# Patient Record
Sex: Female | Born: 1935
Health system: Southern US, Community
[De-identification: ages and names within clinical notes are randomized; demographics above are authoritative.]

## PROBLEM LIST (undated history)

## (undated) DIAGNOSIS — C50919 Malignant neoplasm of unspecified site of unspecified female breast: Secondary | ICD-10-CM

## (undated) DIAGNOSIS — H409 Unspecified glaucoma: Secondary | ICD-10-CM

## (undated) DIAGNOSIS — T4145XA Adverse effect of unspecified anesthetic, initial encounter: Secondary | ICD-10-CM

## (undated) DIAGNOSIS — M1990 Unspecified osteoarthritis, unspecified site: Secondary | ICD-10-CM

## (undated) DIAGNOSIS — I10 Essential (primary) hypertension: Secondary | ICD-10-CM

## (undated) DIAGNOSIS — T8859XA Other complications of anesthesia, initial encounter: Secondary | ICD-10-CM

## (undated) DIAGNOSIS — L719 Rosacea, unspecified: Secondary | ICD-10-CM

## (undated) DIAGNOSIS — K219 Gastro-esophageal reflux disease without esophagitis: Secondary | ICD-10-CM

## (undated) DIAGNOSIS — M810 Age-related osteoporosis without current pathological fracture: Secondary | ICD-10-CM

## (undated) DIAGNOSIS — C801 Malignant (primary) neoplasm, unspecified: Secondary | ICD-10-CM

## (undated) HISTORY — PX: TONSILLECTOMY: SUR1361

## (undated) HISTORY — DX: Unspecified glaucoma: H40.9

## (undated) HISTORY — PX: APPENDECTOMY: SHX54

## (undated) HISTORY — DX: Age-related osteoporosis without current pathological fracture: M81.0

## (undated) HISTORY — DX: Essential (primary) hypertension: I10

## (undated) HISTORY — PX: BREAST BIOPSY: SHX20

## (undated) HISTORY — DX: Unspecified osteoarthritis, unspecified site: M19.90

## (undated) HISTORY — PX: EYE SURGERY: SHX253

## (undated) HISTORY — PX: BACK SURGERY: SHX140

## (undated) HISTORY — PX: SPINE SURGERY: SHX786

---

## 1998-07-20 ENCOUNTER — Other Ambulatory Visit: Admission: RE | Admit: 1998-07-20 | Discharge: 1998-07-20 | Payer: Self-pay | Admitting: *Deleted

## 1999-08-19 ENCOUNTER — Encounter: Payer: Self-pay | Admitting: *Deleted

## 1999-08-19 ENCOUNTER — Encounter: Admission: RE | Admit: 1999-08-19 | Discharge: 1999-08-19 | Payer: Self-pay | Admitting: *Deleted

## 1999-08-20 ENCOUNTER — Other Ambulatory Visit: Admission: RE | Admit: 1999-08-20 | Discharge: 1999-08-20 | Payer: Self-pay | Admitting: *Deleted

## 2000-08-21 ENCOUNTER — Other Ambulatory Visit: Admission: RE | Admit: 2000-08-21 | Discharge: 2000-08-21 | Payer: Self-pay | Admitting: *Deleted

## 2000-08-22 ENCOUNTER — Encounter (INDEPENDENT_AMBULATORY_CARE_PROVIDER_SITE_OTHER): Payer: Self-pay | Admitting: Specialist

## 2000-08-22 ENCOUNTER — Other Ambulatory Visit: Admission: RE | Admit: 2000-08-22 | Discharge: 2000-08-22 | Payer: Self-pay | Admitting: *Deleted

## 2001-04-26 ENCOUNTER — Other Ambulatory Visit: Admission: RE | Admit: 2001-04-26 | Discharge: 2001-04-26 | Payer: Self-pay | Admitting: *Deleted

## 2001-08-22 ENCOUNTER — Other Ambulatory Visit: Admission: RE | Admit: 2001-08-22 | Discharge: 2001-08-22 | Payer: Self-pay | Admitting: *Deleted

## 2002-10-30 ENCOUNTER — Other Ambulatory Visit: Admission: RE | Admit: 2002-10-30 | Discharge: 2002-10-30 | Payer: Self-pay | Admitting: *Deleted

## 2002-12-18 ENCOUNTER — Encounter: Payer: Self-pay | Admitting: Family Medicine

## 2002-12-18 ENCOUNTER — Encounter: Admission: RE | Admit: 2002-12-18 | Discharge: 2002-12-18 | Payer: Self-pay | Admitting: Family Medicine

## 2004-01-08 ENCOUNTER — Other Ambulatory Visit: Admission: RE | Admit: 2004-01-08 | Discharge: 2004-01-08 | Payer: Self-pay | Admitting: *Deleted

## 2004-01-28 ENCOUNTER — Encounter: Admission: RE | Admit: 2004-01-28 | Discharge: 2004-01-28 | Payer: Self-pay | Admitting: *Deleted

## 2008-07-02 ENCOUNTER — Encounter: Admission: RE | Admit: 2008-07-02 | Discharge: 2008-07-02 | Payer: Self-pay | Admitting: Family Medicine

## 2009-05-14 ENCOUNTER — Encounter: Payer: Self-pay | Admitting: Cardiology

## 2010-03-02 ENCOUNTER — Encounter: Admission: RE | Admit: 2010-03-02 | Discharge: 2010-03-02 | Payer: Self-pay | Admitting: Family Medicine

## 2010-08-01 ENCOUNTER — Encounter: Payer: Self-pay | Admitting: Family Medicine

## 2011-08-23 DIAGNOSIS — H43819 Vitreous degeneration, unspecified eye: Secondary | ICD-10-CM | POA: Diagnosis not present

## 2011-08-23 DIAGNOSIS — H4010X0 Unspecified open-angle glaucoma, stage unspecified: Secondary | ICD-10-CM | POA: Diagnosis not present

## 2011-08-23 DIAGNOSIS — H2510 Age-related nuclear cataract, unspecified eye: Secondary | ICD-10-CM | POA: Diagnosis not present

## 2011-09-19 DIAGNOSIS — H2510 Age-related nuclear cataract, unspecified eye: Secondary | ICD-10-CM | POA: Diagnosis not present

## 2011-09-19 DIAGNOSIS — H18419 Arcus senilis, unspecified eye: Secondary | ICD-10-CM | POA: Diagnosis not present

## 2011-09-28 DIAGNOSIS — IMO0002 Reserved for concepts with insufficient information to code with codable children: Secondary | ICD-10-CM | POA: Diagnosis not present

## 2011-09-28 DIAGNOSIS — H2510 Age-related nuclear cataract, unspecified eye: Secondary | ICD-10-CM | POA: Diagnosis not present

## 2011-10-05 DIAGNOSIS — IMO0002 Reserved for concepts with insufficient information to code with codable children: Secondary | ICD-10-CM | POA: Diagnosis not present

## 2011-10-05 DIAGNOSIS — H2510 Age-related nuclear cataract, unspecified eye: Secondary | ICD-10-CM | POA: Diagnosis not present

## 2011-11-14 DIAGNOSIS — E7800 Pure hypercholesterolemia, unspecified: Secondary | ICD-10-CM | POA: Diagnosis not present

## 2011-11-14 DIAGNOSIS — M899 Disorder of bone, unspecified: Secondary | ICD-10-CM | POA: Diagnosis not present

## 2011-11-14 DIAGNOSIS — M949 Disorder of cartilage, unspecified: Secondary | ICD-10-CM | POA: Diagnosis not present

## 2011-11-14 DIAGNOSIS — I10 Essential (primary) hypertension: Secondary | ICD-10-CM | POA: Diagnosis not present

## 2011-11-14 DIAGNOSIS — D126 Benign neoplasm of colon, unspecified: Secondary | ICD-10-CM | POA: Diagnosis not present

## 2011-11-17 DIAGNOSIS — Z1211 Encounter for screening for malignant neoplasm of colon: Secondary | ICD-10-CM | POA: Diagnosis not present

## 2011-11-21 DIAGNOSIS — E7800 Pure hypercholesterolemia, unspecified: Secondary | ICD-10-CM | POA: Diagnosis not present

## 2011-12-26 DIAGNOSIS — I10 Essential (primary) hypertension: Secondary | ICD-10-CM | POA: Diagnosis not present

## 2011-12-26 DIAGNOSIS — R609 Edema, unspecified: Secondary | ICD-10-CM | POA: Diagnosis not present

## 2011-12-26 DIAGNOSIS — E7800 Pure hypercholesterolemia, unspecified: Secondary | ICD-10-CM | POA: Diagnosis not present

## 2012-01-09 DIAGNOSIS — R609 Edema, unspecified: Secondary | ICD-10-CM | POA: Diagnosis not present

## 2012-01-09 DIAGNOSIS — E7800 Pure hypercholesterolemia, unspecified: Secondary | ICD-10-CM | POA: Diagnosis not present

## 2012-01-09 DIAGNOSIS — I10 Essential (primary) hypertension: Secondary | ICD-10-CM | POA: Diagnosis not present

## 2012-03-05 DIAGNOSIS — H4011X0 Primary open-angle glaucoma, stage unspecified: Secondary | ICD-10-CM | POA: Diagnosis not present

## 2012-03-14 DIAGNOSIS — Z961 Presence of intraocular lens: Secondary | ICD-10-CM | POA: Diagnosis not present

## 2012-03-14 DIAGNOSIS — H26499 Other secondary cataract, unspecified eye: Secondary | ICD-10-CM | POA: Diagnosis not present

## 2012-03-28 DIAGNOSIS — H26499 Other secondary cataract, unspecified eye: Secondary | ICD-10-CM | POA: Diagnosis not present

## 2012-04-09 ENCOUNTER — Other Ambulatory Visit: Payer: Self-pay | Admitting: Neurosurgery

## 2012-04-09 DIAGNOSIS — D320 Benign neoplasm of cerebral meninges: Secondary | ICD-10-CM

## 2012-04-27 DIAGNOSIS — Z23 Encounter for immunization: Secondary | ICD-10-CM | POA: Diagnosis not present

## 2012-05-03 DIAGNOSIS — H4011X0 Primary open-angle glaucoma, stage unspecified: Secondary | ICD-10-CM | POA: Diagnosis not present

## 2012-05-03 DIAGNOSIS — Z961 Presence of intraocular lens: Secondary | ICD-10-CM | POA: Diagnosis not present

## 2012-05-07 DIAGNOSIS — M899 Disorder of bone, unspecified: Secondary | ICD-10-CM | POA: Diagnosis not present

## 2012-05-07 DIAGNOSIS — M949 Disorder of cartilage, unspecified: Secondary | ICD-10-CM | POA: Diagnosis not present

## 2012-05-07 DIAGNOSIS — I10 Essential (primary) hypertension: Secondary | ICD-10-CM | POA: Diagnosis not present

## 2012-05-07 DIAGNOSIS — M79609 Pain in unspecified limb: Secondary | ICD-10-CM | POA: Diagnosis not present

## 2012-05-07 DIAGNOSIS — E7800 Pure hypercholesterolemia, unspecified: Secondary | ICD-10-CM | POA: Diagnosis not present

## 2012-05-08 ENCOUNTER — Ambulatory Visit
Admission: RE | Admit: 2012-05-08 | Discharge: 2012-05-08 | Disposition: A | Discharge status: Home / Self Care | Payer: Medicare Other | Source: Ambulatory Visit | Attending: Neurosurgery | Admitting: Neurosurgery

## 2012-05-08 DIAGNOSIS — D320 Benign neoplasm of cerebral meninges: Secondary | ICD-10-CM

## 2012-05-08 MED ORDER — GADOBENATE DIMEGLUMINE 529 MG/ML IV SOLN
13.0000 mL | Freq: Once | INTRAVENOUS | Status: AC | PRN
  Administered 2012-05-08: 13 mL via INTRAVENOUS

## 2012-05-25 DIAGNOSIS — D320 Benign neoplasm of cerebral meninges: Secondary | ICD-10-CM | POA: Diagnosis not present

## 2012-08-03 DIAGNOSIS — Z961 Presence of intraocular lens: Secondary | ICD-10-CM | POA: Diagnosis not present

## 2012-08-03 DIAGNOSIS — H4011X0 Primary open-angle glaucoma, stage unspecified: Secondary | ICD-10-CM | POA: Diagnosis not present

## 2012-08-06 DIAGNOSIS — E7800 Pure hypercholesterolemia, unspecified: Secondary | ICD-10-CM | POA: Diagnosis not present

## 2012-09-12 DIAGNOSIS — L909 Atrophic disorder of skin, unspecified: Secondary | ICD-10-CM | POA: Diagnosis not present

## 2012-09-12 DIAGNOSIS — L919 Hypertrophic disorder of the skin, unspecified: Secondary | ICD-10-CM | POA: Diagnosis not present

## 2012-09-12 DIAGNOSIS — L821 Other seborrheic keratosis: Secondary | ICD-10-CM | POA: Diagnosis not present

## 2012-11-05 DIAGNOSIS — IMO0002 Reserved for concepts with insufficient information to code with codable children: Secondary | ICD-10-CM | POA: Diagnosis not present

## 2012-11-27 DIAGNOSIS — I10 Essential (primary) hypertension: Secondary | ICD-10-CM | POA: Diagnosis not present

## 2012-11-27 DIAGNOSIS — E7800 Pure hypercholesterolemia, unspecified: Secondary | ICD-10-CM | POA: Diagnosis not present

## 2012-11-27 DIAGNOSIS — T148XXA Other injury of unspecified body region, initial encounter: Secondary | ICD-10-CM | POA: Diagnosis not present

## 2013-01-18 DIAGNOSIS — H4011X0 Primary open-angle glaucoma, stage unspecified: Secondary | ICD-10-CM | POA: Diagnosis not present

## 2013-01-18 DIAGNOSIS — Z961 Presence of intraocular lens: Secondary | ICD-10-CM | POA: Diagnosis not present

## 2013-01-18 DIAGNOSIS — H04129 Dry eye syndrome of unspecified lacrimal gland: Secondary | ICD-10-CM | POA: Diagnosis not present

## 2013-01-29 DIAGNOSIS — E7800 Pure hypercholesterolemia, unspecified: Secondary | ICD-10-CM | POA: Diagnosis not present

## 2013-01-29 DIAGNOSIS — M545 Low back pain, unspecified: Secondary | ICD-10-CM | POA: Diagnosis not present

## 2013-01-29 DIAGNOSIS — I10 Essential (primary) hypertension: Secondary | ICD-10-CM | POA: Diagnosis not present

## 2013-02-27 DIAGNOSIS — M899 Disorder of bone, unspecified: Secondary | ICD-10-CM | POA: Diagnosis not present

## 2013-02-27 DIAGNOSIS — Z23 Encounter for immunization: Secondary | ICD-10-CM | POA: Diagnosis not present

## 2013-02-27 DIAGNOSIS — I10 Essential (primary) hypertension: Secondary | ICD-10-CM | POA: Diagnosis not present

## 2013-02-27 DIAGNOSIS — E7800 Pure hypercholesterolemia, unspecified: Secondary | ICD-10-CM | POA: Diagnosis not present

## 2013-04-24 DIAGNOSIS — H4011X0 Primary open-angle glaucoma, stage unspecified: Secondary | ICD-10-CM | POA: Diagnosis not present

## 2013-05-07 DIAGNOSIS — I10 Essential (primary) hypertension: Secondary | ICD-10-CM | POA: Diagnosis not present

## 2013-05-07 DIAGNOSIS — M899 Disorder of bone, unspecified: Secondary | ICD-10-CM | POA: Diagnosis not present

## 2013-05-07 DIAGNOSIS — Z0000 Encounter for general adult medical examination without abnormal findings: Secondary | ICD-10-CM | POA: Diagnosis not present

## 2013-05-07 DIAGNOSIS — H409 Unspecified glaucoma: Secondary | ICD-10-CM | POA: Diagnosis not present

## 2013-05-07 DIAGNOSIS — D126 Benign neoplasm of colon, unspecified: Secondary | ICD-10-CM | POA: Diagnosis not present

## 2013-05-28 DIAGNOSIS — Z1211 Encounter for screening for malignant neoplasm of colon: Secondary | ICD-10-CM | POA: Diagnosis not present

## 2013-08-12 DIAGNOSIS — I10 Essential (primary) hypertension: Secondary | ICD-10-CM | POA: Diagnosis not present

## 2013-09-09 DIAGNOSIS — H4011X0 Primary open-angle glaucoma, stage unspecified: Secondary | ICD-10-CM | POA: Diagnosis not present

## 2013-09-09 DIAGNOSIS — H47239 Glaucomatous optic atrophy, unspecified eye: Secondary | ICD-10-CM | POA: Diagnosis not present

## 2013-09-11 DIAGNOSIS — Z23 Encounter for immunization: Secondary | ICD-10-CM | POA: Diagnosis not present

## 2013-09-11 DIAGNOSIS — M79609 Pain in unspecified limb: Secondary | ICD-10-CM | POA: Diagnosis not present

## 2013-09-11 DIAGNOSIS — I10 Essential (primary) hypertension: Secondary | ICD-10-CM | POA: Diagnosis not present

## 2014-02-03 DIAGNOSIS — I959 Hypotension, unspecified: Secondary | ICD-10-CM | POA: Diagnosis not present

## 2014-02-21 DIAGNOSIS — M545 Low back pain, unspecified: Secondary | ICD-10-CM | POA: Diagnosis not present

## 2014-02-21 DIAGNOSIS — R55 Syncope and collapse: Secondary | ICD-10-CM | POA: Diagnosis not present

## 2014-02-24 DIAGNOSIS — H47239 Glaucomatous optic atrophy, unspecified eye: Secondary | ICD-10-CM | POA: Diagnosis not present

## 2014-03-14 DIAGNOSIS — E7800 Pure hypercholesterolemia, unspecified: Secondary | ICD-10-CM | POA: Diagnosis not present

## 2014-03-14 DIAGNOSIS — D126 Benign neoplasm of colon, unspecified: Secondary | ICD-10-CM | POA: Diagnosis not present

## 2014-03-14 DIAGNOSIS — Z23 Encounter for immunization: Secondary | ICD-10-CM | POA: Diagnosis not present

## 2014-03-14 DIAGNOSIS — M899 Disorder of bone, unspecified: Secondary | ICD-10-CM | POA: Diagnosis not present

## 2014-03-14 DIAGNOSIS — I10 Essential (primary) hypertension: Secondary | ICD-10-CM | POA: Diagnosis not present

## 2014-03-18 DIAGNOSIS — D126 Benign neoplasm of colon, unspecified: Secondary | ICD-10-CM | POA: Diagnosis not present

## 2014-03-18 DIAGNOSIS — I10 Essential (primary) hypertension: Secondary | ICD-10-CM | POA: Diagnosis not present

## 2014-03-18 DIAGNOSIS — M899 Disorder of bone, unspecified: Secondary | ICD-10-CM | POA: Diagnosis not present

## 2014-03-18 DIAGNOSIS — M949 Disorder of cartilage, unspecified: Secondary | ICD-10-CM | POA: Diagnosis not present

## 2014-03-18 DIAGNOSIS — E7800 Pure hypercholesterolemia, unspecified: Secondary | ICD-10-CM | POA: Diagnosis not present

## 2014-03-21 DIAGNOSIS — H1030 Unspecified acute conjunctivitis, unspecified eye: Secondary | ICD-10-CM | POA: Diagnosis not present

## 2014-03-21 DIAGNOSIS — H4011X0 Primary open-angle glaucoma, stage unspecified: Secondary | ICD-10-CM | POA: Diagnosis not present

## 2014-05-01 DIAGNOSIS — H02836 Dermatochalasis of left eye, unspecified eyelid: Secondary | ICD-10-CM | POA: Diagnosis not present

## 2014-05-01 DIAGNOSIS — H4011X2 Primary open-angle glaucoma, moderate stage: Secondary | ICD-10-CM | POA: Diagnosis not present

## 2014-05-01 DIAGNOSIS — H04123 Dry eye syndrome of bilateral lacrimal glands: Secondary | ICD-10-CM | POA: Diagnosis not present

## 2014-05-14 DIAGNOSIS — M8580 Other specified disorders of bone density and structure, unspecified site: Secondary | ICD-10-CM | POA: Diagnosis not present

## 2014-06-26 DIAGNOSIS — R55 Syncope and collapse: Secondary | ICD-10-CM | POA: Diagnosis not present

## 2014-06-26 DIAGNOSIS — G44209 Tension-type headache, unspecified, not intractable: Secondary | ICD-10-CM | POA: Diagnosis not present

## 2014-06-26 DIAGNOSIS — I10 Essential (primary) hypertension: Secondary | ICD-10-CM | POA: Diagnosis not present

## 2014-07-15 DIAGNOSIS — Z86010 Personal history of colonic polyps: Secondary | ICD-10-CM | POA: Diagnosis not present

## 2014-07-15 DIAGNOSIS — K5900 Constipation, unspecified: Secondary | ICD-10-CM | POA: Diagnosis not present

## 2014-07-18 ENCOUNTER — Encounter: Payer: Self-pay | Admitting: Cardiology

## 2014-07-18 DIAGNOSIS — R55 Syncope and collapse: Secondary | ICD-10-CM | POA: Diagnosis not present

## 2014-07-18 DIAGNOSIS — M2550 Pain in unspecified joint: Secondary | ICD-10-CM | POA: Diagnosis not present

## 2014-08-22 DIAGNOSIS — Z09 Encounter for follow-up examination after completed treatment for conditions other than malignant neoplasm: Secondary | ICD-10-CM | POA: Diagnosis not present

## 2014-08-22 DIAGNOSIS — Z86010 Personal history of colonic polyps: Secondary | ICD-10-CM | POA: Diagnosis not present

## 2014-08-22 DIAGNOSIS — D128 Benign neoplasm of rectum: Secondary | ICD-10-CM | POA: Diagnosis not present

## 2014-08-22 DIAGNOSIS — D126 Benign neoplasm of colon, unspecified: Secondary | ICD-10-CM | POA: Diagnosis not present

## 2014-09-01 DIAGNOSIS — L820 Inflamed seborrheic keratosis: Secondary | ICD-10-CM | POA: Diagnosis not present

## 2014-09-01 DIAGNOSIS — L821 Other seborrheic keratosis: Secondary | ICD-10-CM | POA: Diagnosis not present

## 2014-09-02 DIAGNOSIS — H4011X2 Primary open-angle glaucoma, moderate stage: Secondary | ICD-10-CM | POA: Diagnosis not present

## 2014-09-22 DIAGNOSIS — M859 Disorder of bone density and structure, unspecified: Secondary | ICD-10-CM | POA: Diagnosis not present

## 2014-09-22 DIAGNOSIS — H409 Unspecified glaucoma: Secondary | ICD-10-CM | POA: Diagnosis not present

## 2014-09-22 DIAGNOSIS — Z86010 Personal history of colonic polyps: Secondary | ICD-10-CM | POA: Diagnosis not present

## 2014-09-22 DIAGNOSIS — I10 Essential (primary) hypertension: Secondary | ICD-10-CM | POA: Diagnosis not present

## 2014-09-26 DIAGNOSIS — H4011X2 Primary open-angle glaucoma, moderate stage: Secondary | ICD-10-CM | POA: Diagnosis not present

## 2014-10-17 DIAGNOSIS — H4011X2 Primary open-angle glaucoma, moderate stage: Secondary | ICD-10-CM | POA: Diagnosis not present

## 2014-10-17 DIAGNOSIS — H16223 Keratoconjunctivitis sicca, not specified as Sjogren's, bilateral: Secondary | ICD-10-CM | POA: Diagnosis not present

## 2014-11-07 DIAGNOSIS — H4011X2 Primary open-angle glaucoma, moderate stage: Secondary | ICD-10-CM | POA: Diagnosis not present

## 2014-11-25 DIAGNOSIS — H4011X2 Primary open-angle glaucoma, moderate stage: Secondary | ICD-10-CM | POA: Diagnosis not present

## 2015-03-25 DIAGNOSIS — M859 Disorder of bone density and structure, unspecified: Secondary | ICD-10-CM | POA: Diagnosis not present

## 2015-03-25 DIAGNOSIS — M2550 Pain in unspecified joint: Secondary | ICD-10-CM | POA: Diagnosis not present

## 2015-03-25 DIAGNOSIS — Z23 Encounter for immunization: Secondary | ICD-10-CM | POA: Diagnosis not present

## 2015-03-25 DIAGNOSIS — I10 Essential (primary) hypertension: Secondary | ICD-10-CM | POA: Diagnosis not present

## 2015-03-31 DIAGNOSIS — H4011X2 Primary open-angle glaucoma, moderate stage: Secondary | ICD-10-CM | POA: Diagnosis not present

## 2015-03-31 DIAGNOSIS — H0289 Other specified disorders of eyelid: Secondary | ICD-10-CM | POA: Diagnosis not present

## 2015-04-29 DIAGNOSIS — M79642 Pain in left hand: Secondary | ICD-10-CM | POA: Diagnosis not present

## 2015-04-29 DIAGNOSIS — M150 Primary generalized (osteo)arthritis: Secondary | ICD-10-CM | POA: Diagnosis not present

## 2015-04-29 DIAGNOSIS — R768 Other specified abnormal immunological findings in serum: Secondary | ICD-10-CM | POA: Diagnosis not present

## 2015-04-29 DIAGNOSIS — M79641 Pain in right hand: Secondary | ICD-10-CM | POA: Diagnosis not present

## 2015-04-29 DIAGNOSIS — R5383 Other fatigue: Secondary | ICD-10-CM | POA: Diagnosis not present

## 2015-04-29 DIAGNOSIS — M7989 Other specified soft tissue disorders: Secondary | ICD-10-CM | POA: Diagnosis not present

## 2015-04-29 DIAGNOSIS — M79671 Pain in right foot: Secondary | ICD-10-CM | POA: Diagnosis not present

## 2015-05-13 DIAGNOSIS — M79642 Pain in left hand: Secondary | ICD-10-CM | POA: Diagnosis not present

## 2015-05-13 DIAGNOSIS — M79671 Pain in right foot: Secondary | ICD-10-CM | POA: Diagnosis not present

## 2015-05-13 DIAGNOSIS — M150 Primary generalized (osteo)arthritis: Secondary | ICD-10-CM | POA: Diagnosis not present

## 2015-05-13 DIAGNOSIS — E7800 Pure hypercholesterolemia, unspecified: Secondary | ICD-10-CM | POA: Diagnosis not present

## 2015-05-13 DIAGNOSIS — M0579 Rheumatoid arthritis with rheumatoid factor of multiple sites without organ or systems involvement: Secondary | ICD-10-CM | POA: Diagnosis not present

## 2015-05-13 DIAGNOSIS — R5383 Other fatigue: Secondary | ICD-10-CM | POA: Diagnosis not present

## 2015-05-13 DIAGNOSIS — M7989 Other specified soft tissue disorders: Secondary | ICD-10-CM | POA: Diagnosis not present

## 2015-07-14 DIAGNOSIS — M150 Primary generalized (osteo)arthritis: Secondary | ICD-10-CM | POA: Diagnosis not present

## 2015-07-14 DIAGNOSIS — R768 Other specified abnormal immunological findings in serum: Secondary | ICD-10-CM | POA: Diagnosis not present

## 2015-07-14 DIAGNOSIS — M0579 Rheumatoid arthritis with rheumatoid factor of multiple sites without organ or systems involvement: Secondary | ICD-10-CM | POA: Diagnosis not present

## 2015-07-22 DIAGNOSIS — M1990 Unspecified osteoarthritis, unspecified site: Secondary | ICD-10-CM | POA: Diagnosis not present

## 2015-07-22 DIAGNOSIS — I10 Essential (primary) hypertension: Secondary | ICD-10-CM | POA: Diagnosis not present

## 2015-07-22 DIAGNOSIS — Z86010 Personal history of colonic polyps: Secondary | ICD-10-CM | POA: Diagnosis not present

## 2015-07-22 DIAGNOSIS — M859 Disorder of bone density and structure, unspecified: Secondary | ICD-10-CM | POA: Diagnosis not present

## 2015-07-22 DIAGNOSIS — M069 Rheumatoid arthritis, unspecified: Secondary | ICD-10-CM | POA: Diagnosis not present

## 2015-07-30 DIAGNOSIS — S60422D Blister (nonthermal) of right middle finger, subsequent encounter: Secondary | ICD-10-CM | POA: Diagnosis not present

## 2015-07-30 DIAGNOSIS — T300 Burn of unspecified body region, unspecified degree: Secondary | ICD-10-CM | POA: Diagnosis not present

## 2015-07-31 DIAGNOSIS — Z79899 Other long term (current) drug therapy: Secondary | ICD-10-CM | POA: Diagnosis not present

## 2015-07-31 DIAGNOSIS — H401132 Primary open-angle glaucoma, bilateral, moderate stage: Secondary | ICD-10-CM | POA: Diagnosis not present

## 2015-07-31 DIAGNOSIS — H16223 Keratoconjunctivitis sicca, not specified as Sjogren's, bilateral: Secondary | ICD-10-CM | POA: Diagnosis not present

## 2015-10-12 DIAGNOSIS — M150 Primary generalized (osteo)arthritis: Secondary | ICD-10-CM | POA: Diagnosis not present

## 2015-10-12 DIAGNOSIS — M0579 Rheumatoid arthritis with rheumatoid factor of multiple sites without organ or systems involvement: Secondary | ICD-10-CM | POA: Diagnosis not present

## 2015-10-12 DIAGNOSIS — M2550 Pain in unspecified joint: Secondary | ICD-10-CM | POA: Diagnosis not present

## 2015-10-12 DIAGNOSIS — R768 Other specified abnormal immunological findings in serum: Secondary | ICD-10-CM | POA: Diagnosis not present

## 2015-10-12 DIAGNOSIS — Z79899 Other long term (current) drug therapy: Secondary | ICD-10-CM | POA: Diagnosis not present

## 2015-10-12 DIAGNOSIS — M720 Palmar fascial fibromatosis [Dupuytren]: Secondary | ICD-10-CM | POA: Diagnosis not present

## 2016-01-13 DIAGNOSIS — M545 Low back pain: Secondary | ICD-10-CM | POA: Diagnosis not present

## 2016-01-13 DIAGNOSIS — M150 Primary generalized (osteo)arthritis: Secondary | ICD-10-CM | POA: Diagnosis not present

## 2016-01-13 DIAGNOSIS — M79641 Pain in right hand: Secondary | ICD-10-CM | POA: Diagnosis not present

## 2016-01-13 DIAGNOSIS — Z79899 Other long term (current) drug therapy: Secondary | ICD-10-CM | POA: Diagnosis not present

## 2016-01-13 DIAGNOSIS — M0579 Rheumatoid arthritis with rheumatoid factor of multiple sites without organ or systems involvement: Secondary | ICD-10-CM | POA: Diagnosis not present

## 2016-01-13 DIAGNOSIS — M79642 Pain in left hand: Secondary | ICD-10-CM | POA: Diagnosis not present

## 2016-01-18 DIAGNOSIS — M859 Disorder of bone density and structure, unspecified: Secondary | ICD-10-CM | POA: Diagnosis not present

## 2016-01-18 DIAGNOSIS — Z86010 Personal history of colonic polyps: Secondary | ICD-10-CM | POA: Diagnosis not present

## 2016-01-18 DIAGNOSIS — M1990 Unspecified osteoarthritis, unspecified site: Secondary | ICD-10-CM | POA: Diagnosis not present

## 2016-01-18 DIAGNOSIS — R05 Cough: Secondary | ICD-10-CM | POA: Diagnosis not present

## 2016-01-18 DIAGNOSIS — I10 Essential (primary) hypertension: Secondary | ICD-10-CM | POA: Diagnosis not present

## 2016-01-18 DIAGNOSIS — E7800 Pure hypercholesterolemia, unspecified: Secondary | ICD-10-CM | POA: Diagnosis not present

## 2016-01-18 DIAGNOSIS — M069 Rheumatoid arthritis, unspecified: Secondary | ICD-10-CM | POA: Diagnosis not present

## 2016-02-05 DIAGNOSIS — M5431 Sciatica, right side: Secondary | ICD-10-CM | POA: Diagnosis not present

## 2016-02-11 ENCOUNTER — Encounter: Payer: Self-pay | Admitting: Physical Therapy

## 2016-02-11 ENCOUNTER — Ambulatory Visit: Payer: Medicare Other | Attending: Internal Medicine | Admitting: Physical Therapy

## 2016-02-11 DIAGNOSIS — M5416 Radiculopathy, lumbar region: Secondary | ICD-10-CM | POA: Diagnosis not present

## 2016-02-11 DIAGNOSIS — M6281 Muscle weakness (generalized): Secondary | ICD-10-CM

## 2016-02-11 NOTE — Patient Instructions (Addendum)
Piriformis Stretch, Supine    Lie supine, legs bent, feet flat. Raise one bent leg and, grasping ankle with both hands, pull right leg toward opposite shoulder. Hold _30__ seconds.  Repeat _2__ times per session. Do __1_ sessions per day. Perform with other leg straight.  Copyright  VHI. All rights reserved.   Chair Sitting    Sit at edge of seat, spine straight, one leg extended. Put a hand on each thigh and bend forward from the hip, keeping spine straight. Allow hand on extended leg to reach toward toes. Support upper body with other arm. Hold _30__ seconds. Repeat __2_ times per session. Do __1_ sessions per day.  Copyright  VHI. All rights reserved.    [facility] [address] [place], [place] [address] Phone # [phone] Fax [phone] Sleeping on Side    Place pillow between knees. Use cervical support under neck and a roll around waist as needed.   Copyright  VHI. All rights reserved.   Posture - Sitting    Sit upright, head facing forward. Try using a roll to support lower back. Keep shoulders relaxed, and avoid rounded back. Keep hips level with knees. Avoid crossing legs for long periods.   Copyright  VHI. All rights reserved.  Computer Work    Position work to Programmer, multimedia. Use proper work and seat height. Keep shoulders back and down, wrists straight, and elbows at right angles. Use chair that provides full back support. Add footrest and lumbar roll as needed.   Copyright  VHI. All rights reserved.  Computer Work    Position work to Programmer, multimedia. Use proper work and seat height. Keep shoulders back and down, wrists straight, and elbows at right angles. Use chair that provides full back support. Add footrest and lumbar roll as needed.   Copyright  VHI. All rights reserved.  Getting Into / Out of Car    Lower self onto seat, scoot back, then bring in one leg at a time. Reverse sequence to get  out.   Copyright  VHI. All rights reserved.  Stand to Sit / Sit to Stand    To sit: Bend knees to lower self onto front edge of chair, then scoot back on seat. To stand: Reverse sequence by placing one foot forward, and scoot to front of seat. Use rocking motion to stand up.  Copyright  VHI. All rights reserved.  Log Roll    Lying on back, bend left knee and place left arm across chest. Roll all in one movement to the right. Reverse to roll to the left. Always move as one unit.   Copyright  VHI. All rights reserved.

## 2016-02-11 NOTE — Therapy (Signed)
[facility] [address], [place], [place], [address] Phone: [phone]   Fax:  [phone]  Physical Therapy Evaluation  Patient Details  Name: Christina Henderson MRN: [id number] Date of Birth: September 12, 1935 Referring Provider: Dr. Dineen Kid  Encounter Date: 02/11/2016      PT End of Session - 02/11/16 1309    Visit Number 1   Number of Visits 10   Date for PT Re-Evaluation 04/07/16   Authorization Type medicare g-code 10th visit   PT Start Time 1230   PT Stop Time 1310   PT Time Calculation (min) 40 min   Activity Tolerance Patient tolerated treatment well   Behavior During Therapy [facility] for tasks assessed/performed      Past Medical History:  Diagnosis Date  . Arthritis    rhuematoid arthritis  . Glaucoma   . Hypertension   . Osteoporosis    osteopenia    Past Surgical History:  Procedure Laterality Date  . APPENDECTOMY    . EYE SURGERY     cataract  . SPINE SURGERY     lumbar lamenectomy    There were no vitals filed for this visit.       Subjective Assessment - 02/11/16 1237    Subjective Patient reports back pain with sciatica 2 weeks ago with sudden onset. Patient is using naproxen for pain. The present pain is 90% better and only to right buttocks. Walk to get mail with incline, has pressure in her back.   Currently in Pain? Yes   Pain Score 2   can go to 10/10   Pain Location Back   Pain Orientation Right   Pain Descriptors / Indicators Aching;Shooting   Pain Type Acute pain   Pain Onset In the past 7 days   Pain Frequency Intermittent   Aggravating Factors  sneeze at night, twist, bend too quickly, walking   Pain Relieving Factors naproxen   Multiple Pain Sites No            [facility] PT Assessment - 02/11/16 0001      Assessment   Medical Diagnosis M54.31 Back pain iwth right-sided sciatica   Referring Provider Dr. Dineen Kid   Onset Date/Surgical Date 02/04/16   Prior Therapy None      Precautions   Precautions None     Restrictions   Weight Bearing Restrictions No     Balance Screen   Has the patient fallen in the past 6 months No   Has the patient had a decrease in activity level because of a fear of falling?  No   Is the patient reluctant to leave their home because of a fear of falling?  No     Home Ecologist residence     Prior Function   Level of Independence Independent   Leisure walking 2 miles; change sheets     Cognition   Overall Cognitive Status Within Functional Limits for tasks assessed     Observation/Other Assessments   Focus on Therapeutic Outcomes (FOTO)  43% limitation  goal is 29% limitation     Posture/Postural Control   Posture/Postural Control Postural limitations   Postural Limitations Decreased lumbar lordosis  pelvis to the right     ROM / Strength   AROM / PROM / Strength AROM;Strength     AROM   Lumbar Extension decreased by 75%   Lumbar - Right Side Bend decreased by 75%     Strength  Right Hip Internal Rotation 3/5  pain     Palpation   Palpation comment tenderness located in right gluteus medius; tightness located in lumbar sacral tissue     Special Tests    Special Tests Hip Special Tests   Hip Special Tests  Trendelenberg Test     Trendelenburg Test   Findings Positive   Side Right   Comments drop left hip                           PT Education - 02/11/16 1324    Education provided Yes   Education Details stretches, body mechanics   Person(s) Educated Patient   Methods Explanation;Demonstration;Verbal cues;Handout   Comprehension Returned demonstration;Verbalized understanding          PT Short Term Goals - 02/11/16 1325      PT SHORT TERM GOAL #1   Title pain with activities is moderate   Time 4   Period Weeks   Status New     PT SHORT TERM GOAL #2   Title pain with walking 1 mile is minimal   Time 4   Period Weeks   Status New      PT SHORT TERM GOAL #3   Title turning in bed with minimal pain   Time 4   Period Weeks   Status New           PT Long Term Goals - 02/11/16 1305      PT LONG TERM GOAL #1   Title independent with HEP   Time 8   Period Weeks   Status New     PT LONG TERM GOAL #2   Title no pain with daily activities    Time 8   Period Weeks   Status New     PT LONG TERM GOAL #3   Title turning in bed without pain   Time 8   Period Weeks   Status New     PT LONG TERM GOAL #4   Title return to walking for 2 miles with minimal to no pain   Time 8   Period Weeks   Status New               Plan - 02/11/16 1311    Clinical Impression Statement Patient is a 80 year old female with diagnosis of back pain with right-sided sciatica for the past 2 weeks with sudden onset. Patient reports intermittent pain that goes from 2/10 to 10/10.  Patient reports the pain is worse with turning in bed,, sneeze at night, bending.  Patient posture consists of flat lumbar spine and hips to the right.  Tissue along the lumbar spine is fascially restricted.  Tenderness located in right gluteus medius. Right hip abduction 3/5 and internal rotated 3+/5 with pain.  FOTO score is 43% limitaiton.  Patient has a history of osteopenia, lumbar lamenectomy, rehumatoid arthritis.  Patient is moderate complexity.  Patient will benefit from phsyical therapy to reduce pain and improve function.    Rehab Potential Excellent   Clinical Impairments Affecting Rehab Potential None   PT Frequency 2x / week   PT Duration 8 weeks   PT Treatment/Interventions Cryotherapy;Electrical Stimulation;Functional mobility training;Ultrasound;Traction;Moist Heat;Patient/family education;Neuromuscular re-education;Therapeutic exercise;Therapeutic activities;Manual techniques;Passive range of motion;Dry needling   PT Next Visit Plan soft tissue work to lumbar and right gluteus medius; modalities as needed; core stabilization, body mechanics    PT Home Exercise Plan core stabilization  Recommended Other Services None   Consulted and Agree with Plan of Care Patient      Patient will benefit from skilled therapeutic intervention in order to improve the following deficits and impairments:  Decreased range of motion, Difficulty walking, Increased fascial restricitons, Increased muscle spasms, Decreased endurance, Decreased activity tolerance, Pain, Decreased mobility, Decreased strength, Impaired flexibility  Visit Diagnosis: Radiculopathy, lumbar region - Plan: PT plan of care cert/re-cert  Muscle weakness (generalized) - Plan: PT plan of care cert/re-cert      G-Codes - [id number]    Functional Assessment Tool Used FOTO score is 43% limitation  goal is 29% limitation   Functional Limitation Changing and maintaining body position   Changing and Maintaining Body Position Current Status [id number]) At least 40 percent but less than 60 percent impaired, limited or restricted   Changing and Maintaining Body Position Goal Status [id number]) At least 20 percent but less than 40 percent impaired, limited or restricted       Problem List There are no active problems to display for this patient.   Earlie Counts, PT 02/11/16 1:29 PM   [facility] [address], [place], [place], [address] Phone: [phone]   Fax:  [phone]  Name: Kindyl Barsch MRN: [id number] Date of Birth: 1936-03-04

## 2016-02-16 ENCOUNTER — Encounter: Payer: Self-pay | Admitting: Physical Therapy

## 2016-02-16 ENCOUNTER — Ambulatory Visit: Payer: Medicare Other | Admitting: Physical Therapy

## 2016-02-16 DIAGNOSIS — M5416 Radiculopathy, lumbar region: Secondary | ICD-10-CM | POA: Diagnosis not present

## 2016-02-16 DIAGNOSIS — M6281 Muscle weakness (generalized): Secondary | ICD-10-CM | POA: Diagnosis not present

## 2016-02-16 NOTE — Therapy (Signed)
[facility] [address], [place], [place], [address] Phone: [phone]   Fax:  [phone]  Physical Therapy Treatment  Patient Details  Name: Christina Henderson MRN: [id number] Date of Birth: 1935/12/31 Referring Provider: Dr. Dineen Kid  Encounter Date: 02/16/2016      PT End of Session - 02/16/16 1314    Visit Number 2   Number of Visits 10   Date for PT Re-Evaluation 04/07/16   Authorization Type medicare g-code 10th visit   PT Start Time 1230   PT Stop Time 1312   PT Time Calculation (min) 42 min   Activity Tolerance Patient tolerated treatment well   Behavior During Therapy [facility] for tasks assessed/performed      Past Medical History:  Diagnosis Date  . Arthritis    rhuematoid arthritis  . Glaucoma   . Hypertension   . Osteoporosis    osteopenia    Past Surgical History:  Procedure Laterality Date  . APPENDECTOMY    . EYE SURGERY     cataract  . SPINE SURGERY     lumbar lamenectomy    There were no vitals filed for this visit.      Subjective Assessment - 02/16/16 1235    Subjective no pain, just a couple of aches. 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    Patient Stated Goals reduce pain   Currently in Pain? Yes   Pain Score 1    Pain Location Back   Pain Orientation Right   Pain Descriptors / Indicators Aching   Pain Onset In the past 7 days   Pain Frequency Intermittent   Aggravating Factors  sneeze at night , twist, bend too quickly, walking   Pain Relieving Factors naproxen   Multiple Pain Sites No                         [facility] Adult PT Treatment/Exercise - 02/16/16 0001      Manual Therapy   Manual Therapy Soft tissue mobilization   Soft tissue mobilization to right gluteus medius, lumbar paraspinals                PT Education - 02/16/16 1314    Education provided Yes   Education Details body mechanics, core stabilization   Person(s) Educated  Patient   Methods Explanation;Demonstration;Handout   Comprehension Returned demonstration;Verbalized understanding          PT Short Term Goals - 02/16/16 1322      PT SHORT TERM GOAL #1   Title pain with activities is moderate   Time 4   Period Weeks   Status New     PT SHORT TERM GOAL #2   Title pain with walking 1 mile is minimal   Time 4     PT SHORT TERM GOAL #3   Title turning in bed with minimal pain   Time 4   Period Weeks   Status New           PT Long Term Goals - 02/11/16 1305      PT LONG TERM GOAL #1   Title independent with HEP   Time 8   Period Weeks   Status New     PT LONG TERM GOAL #2   Title no pain with daily activities    Time 8   Period Weeks   Status New     PT LONG TERM GOAL #3  Title turning in bed without pain   Time 8   Period Weeks   Status New     PT LONG TERM GOAL #4   Title return to walking for 2 miles with minimal to no pain   Time 8   Period Weeks   Status New               Plan - 02/16/16 1315    Clinical Impression Statement Patient pain has decreased.  Ptient has pain with movement.  Patient has trigger points in the right gluteus medius. Patient understand correct posture to decrease strain on lumbar area.  Patient will benefit from skilled therapy to increase core strength and reduce pain.    Rehab Potential Excellent   Clinical Impairments Affecting Rehab Potential None   PT Frequency 2x / week   PT Duration 8 weeks   PT Treatment/Interventions Cryotherapy;Electrical Stimulation;Functional mobility training;Ultrasound;Traction;Moist Heat;Patient/family education;Neuromuscular re-education;Therapeutic exercise;Therapeutic activities;Manual techniques;Passive range of motion;Dry needling   PT Next Visit Plan soft tissue work to lumbar and right gluteus mediuscore stabilization, strengthen right gluteus medius   PT Home Exercise Plan progress as needed   Consulted and Agree with Plan of Care Patient       Patient will benefit from skilled therapeutic intervention in order to improve the following deficits and impairments:  Decreased range of motion, Difficulty walking, Increased fascial restricitons, Increased muscle spasms, Decreased endurance, Decreased activity tolerance, Pain, Decreased mobility, Decreased strength, Impaired flexibility  Visit Diagnosis: Radiculopathy, lumbar region  Muscle weakness (generalized)     Problem List There are no active problems to display for this patient.   Earlie Counts, PT 02/16/16 1:25 PM    [facility] [address] [place], [place], [address] Phone: [phone]   Fax:  [phone]  Name: Christina Henderson MRN: [id number] Date of Birth: 06/22/1936

## 2016-02-16 NOTE — Patient Instructions (Addendum)
Avoid Twisting    Avoid twisting or bending back. Pivot around using foot movements, and bend at knees if needed when reaching for articles.   Copyright  VHI. All rights reserved.  Bending    Bend at hips and knees, not back. Keep feet shoulder-width apart.   Copyright  VHI. All rights reserved.  Moving Objects    Keep elbows close at sides, and use total body weight and legs to push or pull.   Copyright  VHI. All rights reserved.  Ask For Help    Ask for help and delegate to others when possible. Coordinate your movements when lifting together, and maintain the low back curve.   Copyright  VHI. All rights reserved.  Deep Squat    Squat and lift with both arms held against upper trunk. Tighten stomach muscles without holding breath. Use smooth movements to avoid jerking.  Copyright  VHI. All rights reserved.  Lifting Principles  .Maintain proper posture and head alignment. .Slide object as close as possible before lifting. .Move obstacles out of the way. .Test before lifting; ask for help if too heavy. .Tighten stomach muscles without holding breath. .Use smooth movements; do not jerk. .Use legs to do the work, and pivot with feet. .Distribute the work load symmetrically and close to the center of trunk. .Push instead of pull whenever possible.  Copyright  VHI. All rights reserved.  Housework - Bed    Use light bedding, such as a down comforter. Place one knee up on bed to reach when making bed. Use extra-depth fitted sheets, and squat down when tucking corners.   Copyright  VHI. All rights reserved.  Housework - Sweeping    Use long-handled equipment to avoid stooping.   Copyright  VHI. All rights reserved.  Housework - Licensed conveyancer for hard-to-reach places so as to avoid straining.   Copyright  VHI. All rights reserved.  Refrigerator    Squat with knees apart to reach lower shelves and  drawers.   Copyright  VHI. All rights reserved.   Bracing With Knee Fallout (Hook-Lying)    With neutral spine, tighten pelvic floor and abdominals and hold. Alternating legs, drop knee out to side. Keep opposite hip still. Repeat _10__ times each leg. Do _1__ times a day.   Copyright  VHI. All rights reserved.  Bracing With Arms / Legs (Hook-Lying)    With neutral spine, tighten pelvic floor and abdominals and hold. Raise arm and opposite leg, then return. Repeat wtih other limbs. Repeat 10___ times. Do _1__ times a day.   Copyright  VHI. All rights reserved.  Wall Squat    Feet shoulder width apart, _12___ inches in front of wall, lean against wall. Heels on floor, knees parallel, bend hips and knees to almost 90. Hold _5___ seconds. Lower slowly, explode on return. Repeat __5__ times. Do _1___ sessions per day. Work toward 15 seconds. ABDUCTION: Standing (Active)    Stand, feet flat. Lift right leg out to side.  Complete _1__ sets of _10__ repetitions. Each leg. Lead with heel.  Do not move trunk. Perform _1__ sessions per day.  http://gtsc.exer.us/111   Copyright  VHI. All rights reserved.   [facility] [address], [place], [place] [address] Phone # [phone] Fax [phone]   Copyright  VHI. All rights reserved.

## 2016-02-18 ENCOUNTER — Ambulatory Visit: Payer: Medicare Other

## 2016-02-18 DIAGNOSIS — M5416 Radiculopathy, lumbar region: Secondary | ICD-10-CM

## 2016-02-18 DIAGNOSIS — M6281 Muscle weakness (generalized): Secondary | ICD-10-CM

## 2016-02-18 NOTE — Therapy (Signed)
[facility] [address], [place], [place], [address] Phone: [phone]   Fax:  [phone]  Physical Therapy Treatment  Patient Details  Name: Christina Henderson MRN: [id number] Date of Birth: [birth year] Referring Provider: Dr. Dineen Kid  Encounter Date: 02/18/2016      PT End of Session - 02/18/16 1048    Visit Number 3   PT Start Time 1016   PT Stop Time 1048   PT Time Calculation (min) 32 min   Activity Tolerance Patient tolerated treatment well;Patient limited by pain   Behavior During Therapy [facility] for tasks assessed/performed      Past Medical History:  Diagnosis Date  . Arthritis    rhuematoid arthritis  . Glaucoma   . Hypertension   . Osteoporosis    osteopenia    Past Surgical History:  Procedure Laterality Date  . APPENDECTOMY    . EYE SURGERY     cataract  . SPINE SURGERY     lumbar lamenectomy    There were no vitals filed for this visit.      Subjective Assessment - 02/18/16 1012    Subjective I felt worse after last session.  Not sure if I need to come back to PT.     Currently in Pain? No/denies   Pain Score 0-No pain  up to 3-4/10 after treatment last session   Pain Location Back   Pain Orientation Right   Pain Descriptors / Indicators Aching   Pain Type Acute pain   Pain Onset In the past 7 days   Pain Frequency Intermittent   Aggravating Factors  walking   Pain Relieving Factors naproxen            [facility] PT Assessment - 02/18/16 0001      Assessment   Medical Diagnosis M54.31 Back pain iwth right-sided sciatica   Onset Date/Surgical Date 02/04/16     Cognition   Overall Cognitive Status Within Functional Limits for tasks assessed     Observation/Other Assessments   Focus on Therapeutic Outcomes (FOTO)  12% limitation     ROM / Strength   AROM / PROM / Strength Strength;AROM     AROM   Lumbar Extension decreased by 25%   Lumbar - Right Side Bend decreased by 75%      Strength   Right/Left Hip Right;Left   Right Hip External Rotation  4+/5   Right Hip Internal Rotation 4+/5                     [facility] Adult PT Treatment/Exercise - 02/18/16 0001      Exercises   Exercises Knee/Hip;Lumbar     Lumbar Exercises: Stretches   Active Hamstring Stretch 3 reps;30 seconds   Piriformis Stretch 3 reps;30 seconds     Lumbar Exercises: Supine   Ab Set 10 reps;5 seconds     Knee/Hip Exercises: Standing   Hip Abduction Stengthening;Both;1 set;10 reps                PT Education - 02/18/16 1036    Education provided Yes   Education Details review of all HEP: eliminated all painful exercises including core with leg motions   Person(s) Educated Patient   Methods Explanation   Comprehension Verbalized understanding          PT Short Term Goals - 02/18/16 1019      PT SHORT TERM GOAL #1   Title pain with activities is moderate  Status Achieved     PT SHORT TERM GOAL #2   Title pain with walking 1 mile is minimal   Status Not Met     PT SHORT TERM GOAL #3   Title turning in bed with minimal pain   Status Achieved           PT Long Term Goals - 26-Feb-2016 1020      PT LONG TERM GOAL #1   Title independent with HEP   Status Achieved     PT LONG TERM GOAL #2   Title no pain with daily activities    Status Not Met     PT LONG TERM GOAL #3   Title turning in bed without pain   Status Not Met     PT LONG TERM GOAL #4   Title return to walking for 2 miles with minimal to no pain   Status Not Met               Plan - 2016-02-26 1022    Clinical Impression Statement Pt requested D/C from PT at this time to HEP.  Pt has received body mechanics education and is making modifications at home as able.  Pt has HEP in place for core strength and flexibility. PT made modification to HEP due to pain with many of the activities.  Pt reported increased pain after PT session and wants to try HEP at home for further  gains.     PT Home Exercise Plan D/C PT to HEP   Consulted and Agree with Plan of Care Patient      Patient will benefit from skilled therapeutic intervention in order to improve the following deficits and impairments:     Visit Diagnosis: Radiculopathy, lumbar region  Muscle weakness (generalized)       G-Codes - 02/26/2016 1027    Functional Assessment Tool Used FOTO: 12% limitation   Functional Limitation Changing and maintaining body position   Changing and Maintaining Body Position Goal Status (F6433) At least 20 percent but less than 40 percent impaired, limited or restricted   Changing and Maintaining Body Position Discharge Status (I9518) At least 1 percent but less than 20 percent impaired, limited or restricted      Problem List There are no active problems to display for this patient.  PHYSICAL THERAPY DISCHARGE SUMMARY  Visits from Start of Care: 3  Current functional level related to goals / functional outcomes: Pt requested D/C to HEP as she feels better and that PT was aggravating her symptoms.  See above for current stuats.   Remaining deficits: Continued Rt LE/gluteal pain.  Pt has HEP in place to address.     Education / Equipment: HEP Plan: Patient agrees to discharge.  Patient goals were partially met. Patient is being discharged due to the patient's request.  ?????        Sigurd Sos, PT 02-26-2016 10:50 AM  [facility] [address] [place], [place], [address] Phone: [phone]   Fax:  [phone]  Name: Christina Henderson MRN: [id number] Date of Birth: 04-Feb-1936

## 2016-02-18 NOTE — Patient Instructions (Addendum)
Lower abdominal/core stability exercises  1. Practice your breathing technique: Inhale through your nose expanding your belly and rib cage. Try not to breathe into your chest. Exhale slowly and gradually out your mouth feeling a sense of softness to your body. Practice multiple times. This can be performed unlimited.  2. Finding the lower abdominals. Laying on your back with the knees bent, place your fingers just below your belly button. Using your breathing technique from above, on your exhale gently pull the belly button away from your fingertips without tensing any other muscles. Practice this 5x. Next, as you exhale, draw belly button inwards and hold onto it...then feel as if you are pulling that muscle across your pelvis like you are tightening a belt. This can be hard to do at first so be patient and practice. Do 5-10 reps 1-3 x day. Always recognize quality over quantity; if your abdominal muscles become tired you will notice you may tighten/contract other muscles. This is the time to take a break.   Practice this first laying on your back, then in sitting, progressing to standing and finally adding it to all your daily movements.   3. Finding your pelvic floor. Using the breathing technique above, when your exhale, this time draw your pelvic floor muscles up as if you were attempting to stop the flow of urination. Be careful NOT to tense any other muscles. This can be hard, BE PATIENT. Try to hold up to 10 seconds repeating 10x. Try 2x a day. Once you feel you are doing this well, add this contraction to exercise #2. First contracting your pelvic floor followed by lower abdominals.  [facility] [address] [place], [place] [address] Phone # [phone] Fax [phone]

## 2016-02-25 ENCOUNTER — Encounter: Payer: Medicare Other | Admitting: Physical Therapy

## 2016-03-09 DIAGNOSIS — H401132 Primary open-angle glaucoma, bilateral, moderate stage: Secondary | ICD-10-CM | POA: Diagnosis not present

## 2016-03-09 DIAGNOSIS — Z79899 Other long term (current) drug therapy: Secondary | ICD-10-CM | POA: Diagnosis not present

## 2016-03-09 DIAGNOSIS — H16223 Keratoconjunctivitis sicca, not specified as Sjogren's, bilateral: Secondary | ICD-10-CM | POA: Diagnosis not present

## 2016-04-13 DIAGNOSIS — M150 Primary generalized (osteo)arthritis: Secondary | ICD-10-CM | POA: Diagnosis not present

## 2016-04-13 DIAGNOSIS — M545 Low back pain: Secondary | ICD-10-CM | POA: Diagnosis not present

## 2016-04-13 DIAGNOSIS — Z79899 Other long term (current) drug therapy: Secondary | ICD-10-CM | POA: Diagnosis not present

## 2016-04-13 DIAGNOSIS — M0579 Rheumatoid arthritis with rheumatoid factor of multiple sites without organ or systems involvement: Secondary | ICD-10-CM | POA: Diagnosis not present

## 2016-04-13 DIAGNOSIS — M2550 Pain in unspecified joint: Secondary | ICD-10-CM | POA: Diagnosis not present

## 2016-04-13 DIAGNOSIS — Z23 Encounter for immunization: Secondary | ICD-10-CM | POA: Diagnosis not present

## 2016-04-18 DIAGNOSIS — M67442 Ganglion, left hand: Secondary | ICD-10-CM | POA: Diagnosis not present

## 2016-05-23 DIAGNOSIS — M533 Sacrococcygeal disorders, not elsewhere classified: Secondary | ICD-10-CM | POA: Diagnosis not present

## 2016-05-23 DIAGNOSIS — S39013A Strain of muscle, fascia and tendon of pelvis, initial encounter: Secondary | ICD-10-CM | POA: Diagnosis not present

## 2016-05-23 DIAGNOSIS — M67442 Ganglion, left hand: Secondary | ICD-10-CM | POA: Diagnosis not present

## 2016-05-26 ENCOUNTER — Ambulatory Visit: Payer: Medicare Other | Attending: Family Medicine | Admitting: Physical Therapy

## 2016-05-26 DIAGNOSIS — M25651 Stiffness of right hip, not elsewhere classified: Secondary | ICD-10-CM | POA: Diagnosis not present

## 2016-05-26 DIAGNOSIS — M5416 Radiculopathy, lumbar region: Secondary | ICD-10-CM | POA: Diagnosis not present

## 2016-05-26 DIAGNOSIS — M25551 Pain in right hip: Secondary | ICD-10-CM | POA: Diagnosis not present

## 2016-05-26 DIAGNOSIS — M6281 Muscle weakness (generalized): Secondary | ICD-10-CM | POA: Diagnosis not present

## 2016-05-26 NOTE — Patient Instructions (Signed)
Abduction: Clam (Eccentric) - Side-Lying   Lie on side with knees bent. Lift top knee, keeping feet together. Keep trunk steady. Slowly lower for 3-5 seconds. _10-15__ reps per set, __1_ sets per day, __7_ days per week.     Doorway stretch:  Right foot to the back, rock forward 5x;  Then progress to arm slides on doorframe 5x      Copyright  VHI. All rights reserved.    Ruben Im PT [facility] [address], [place], [place] [address] Phone # [phone] Fax [phone]

## 2016-05-26 NOTE — Therapy (Signed)
[facility] [address], [place], [place], [address] Phone: [phone]   Fax:  [phone]  Physical Therapy Evaluation  Patient Details  Name: Christina Henderson MRN: [id number] Date of Birth: 1935-12-22 Referring Provider: Dr. Addison Lank  Encounter Date: 05/26/2016      PT End of Session - 05/26/16 1523    Visit Number 1   Date for PT Re-Evaluation 07/21/16   Authorization Type Medicare G code: KX at visit 15   PT Start Time [id number]   PT Stop Time 1528   PT Time Calculation (min) 43 min   Activity Tolerance Patient tolerated treatment well      Past Medical History:  Diagnosis Date  . Arthritis    rhuematoid arthritis  . Glaucoma   . Hypertension   . Osteoporosis    osteopenia    Past Surgical History:  Procedure Laterality Date  . APPENDECTOMY    . EYE SURGERY     cataract  . SPINE SURGERY     lumbar lamenectomy    There were no vitals filed for this visit.       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   Difficulty with sit to stand and first few steps.     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 no problem    How long can you walk comfortably? around [employer] pushing cart   Diagnostic tests none   Patient Stated Goals get rid of pain; resume normal activities   Currently in Pain? Yes   Pain Score 1    Pain Location Hip   Pain Orientation Right;Anterior   Pain Descriptors / Indicators Dull;Aching   Pain Type Acute pain   Pain Onset 1 to 4 weeks ago   Pain Frequency Intermittent   Aggravating Factors  rising from sitting, feels  shaky   Pain Relieving Factors lying down; medicine I take for RA; sitting            [facility] PT Assessment - 05/26/16 0001      Assessment   Medical Diagnosis right groin, SI joint strain    Referring Provider Dr. Addison Lank   Onset Date/Surgical Date --  4 weeks   Hand Dominance Right   Next MD Visit nothing scheduled    Prior Therapy Yes here for LBP     Precautions   Precautions None     Restrictions   Weight Bearing Restrictions No     Balance Screen   Has the patient fallen in the past 6 months No   Has the patient had a decrease in activity level because of a fear of falling?  No   Is the patient reluctant to leave their home because of a fear of falling?  No     Home Environment   Living Environment Private residence   Living Arrangements Alone   Available Help at Discharge Family   Type of [place] to enter   Entrance Stairs-Number of Steps 3   Entrance Stairs-Rails None   Home Layout Two level   Alternate Level Stairs-Number  of Steps --  laundry is upstairs, railing   Home Equipment None     Prior Function   Level of Independence Independent   Vocation Retired   Leisure quilt      Observation/Other Assessments   Focus on Therapeutic Outcomes (FOTO)  50% limitation      ROM / Strength   AROM / PROM / Strength AROM;Strength     AROM   AROM Assessment Site Hip   Lumbar Flexion WFLs   Lumbar Extension WFLS   Lumbar - Right Side Bend unable to do secondary to pain   Lumbar - Left Side Bend 30     Strength   Strength Assessment Site Hip   Right/Left Hip Right;Left   Right Hip Flexion 4-/5   Right Hip External Rotation  4-/5   Right Hip ABduction 3+/5   Left Hip Flexion 4/5   Left Hip ABduction 4/5     Flexibility   Soft Tissue Assessment /Muscle Length yes  left HS 80   Quadriceps right >left hip flexor length     Palpation   Palpation comment tender point right proximal hip flexor, minimal tenderness gluteals and  piriformis     Saralyn Pilar (FABER) Test   Findings Negative   Side Right     Hip Scouring   Findings Negative   Side Right                           PT Education - 05/26/16 2234    Education provided Yes   Education Details clams and doorway psoas stretch   Person(s) Educated Patient   Methods Explanation;Demonstration;Handout   Comprehension Verbalized understanding;Returned demonstration          PT Short Term Goals - 05/26/16 2246      PT SHORT TERM GOAL #1   Title Patient will be report a 25% improvement in pain with usual ADLS  [id number]   Time 4   Period Weeks   Status On-going     PT SHORT TERM GOAL #2   Title The patient will be able to rise from the chair and walk short distance (timed up and go test) 13 sec or less   Time 4   Period Weeks   Status On-going     PT SHORT TERM GOAL #3   Title Improved right HS length to 65 degrees and hip extension to 10 degrees needed for normal stride length to walk 1/2 mile   Time 4   Period Weeks   Status On-going           PT Long Term Goals - 05/26/16 2250      PT LONG TERM GOAL #1   Title independent with HEP needed for further improvements in ROM and strength right hip   07/21/16   Time 8   Period Weeks   Status On-going     PT LONG TERM GOAL #2   Title The patient will report a > 50% improvement in pain with rising from a chair   Time 8   Status On-going     PT LONG TERM GOAL #3   Title The patient will have improved right hip abduction and hip flexion strength to 4/5 needed for ascending steps to laundry with greater ease   Time 8   Period Weeks   Status On-going     PT LONG TERM GOAL #4   Title FOTO functional outcome score improved from 50% limitation  to 31% indicating improved function with less pain   Time 8   Period Weeks   Status On-going               Plan - 05/26/16 Oct 21, 2233    Clinical Impression Statement The patient complains of a 3-4 week history of right anterior  hip pain and occasionally right buttock pain which she attributes to doing a standing quad stretch prior to walking as well as 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  She reports the pain is worsened with rising from sitting and she is unable to lead with her right foot.  She feels shaky the first few steps.  She has some increased difficulty ascending steps to reach her laundry and uses the railing to assist her.  Decreased  right hip flexor and HS muscle lengths.  Significant weakness with right hip flexion and external rotation 4-/5 but especially hip abduction 3+/5.  The patient has a major pelvic drop when attempting to single leg stand on right.  Tender pain right proximal quad and minimally in gluteals and piriformis.  She would benefit from PT to address these deficits.  She is moderate complexity evaluation secondary to living alone which may affect home compliance, evolving status and numerous co-morbidities which will affect her progress with PT including RA, osteopenia,  history of low back pain with previous spinal surgery.     Rehab Potential Good   Clinical Impairments Affecting Rehab Potential RA, osteopenia   PT Frequency 2x / week   PT Duration 8 weeks   PT Treatment/Interventions ADLs/Self Care Home Management;Cryotherapy;Electrical Stimulation;Iontophoresis 4mg /ml Dexamethasone;Moist Heat;Ultrasound;Patient/family education;Therapeutic exercise;Manual techniques;Taping;Dry needling   PT Next Visit Plan do TUG test;  ionto order on initial PT order;  review clams and doorway psoas stretch and progress hip strengthening and hip flexor stretching;   gentle hip joint mobs;  modalities as needed for pain      Patient will benefit from skilled therapeutic intervention in order to improve the following deficits and impairments:  Decreased activity tolerance, Decreased range of motion, Decreased strength, Increased fascial restricitons, Increased muscle spasms, Pain, Impaired  flexibility  Visit Diagnosis: Pain in right hip - Plan: PT plan of care cert/re-cert  Muscle weakness (generalized) - Plan: PT plan of care cert/re-cert  Stiffness of right hip, not elsewhere classified - Plan: PT plan of care cert/re-cert      G-Codes - [id number] 10/22/2251    Functional Assessment Tool Used FOTO; clinical judgement   Functional Limitation Mobility: Walking and moving around   Mobility: Walking and Moving Around Current Status [phone]) At least 40 percent but less than 60 percent impaired, limited or restricted   Mobility: Walking and Moving Around Goal Status [phone]) At least 20 percent but less than 40 percent impaired, limited or restricted       Problem List There are no active problems to display for this patient.  Ruben Im, PT 05/26/16 10:56 PM Phone: [phone] Fax: [phone]  Alvera Singh 05/26/2016, 10:55 PM  [facility] [address], [place], [place], [address] Phone: [phone]   Fax:  [phone]  Name: Christina Henderson MRN: [id number] Date of Birth: [birth year]

## 2016-05-30 ENCOUNTER — Ambulatory Visit: Payer: Medicare Other | Admitting: Physical Therapy

## 2016-05-30 ENCOUNTER — Encounter: Payer: Self-pay | Admitting: Physical Therapy

## 2016-05-30 DIAGNOSIS — M6281 Muscle weakness (generalized): Secondary | ICD-10-CM

## 2016-05-30 DIAGNOSIS — M5416 Radiculopathy, lumbar region: Secondary | ICD-10-CM

## 2016-05-30 DIAGNOSIS — M25551 Pain in right hip: Secondary | ICD-10-CM

## 2016-05-30 DIAGNOSIS — M25651 Stiffness of right hip, not elsewhere classified: Secondary | ICD-10-CM

## 2016-05-30 NOTE — Therapy (Signed)
[facility] [address], [place], [place], [address] Phone: [phone]   Fax:  [phone]  Physical Therapy Treatment  Patient Details  Name: Christina Henderson MRN: [id number] Date of Birth: 1935/12/13 Referring Provider: Dr. Addison Lank  Encounter Date: 05/30/2016      PT End of Session - 05/30/16 1618    Visit Number 2   Date for PT Re-Evaluation 07/21/16   Authorization Type Medicare G code: KX at visit 15   PT Start Time [id number]   PT Stop Time [id number]   PT Time Calculation (min) 43 min   Activity Tolerance Patient tolerated treatment well      Past Medical History:  Diagnosis Date  . Arthritis    rhuematoid arthritis  . Glaucoma   . Hypertension   . Osteoporosis    osteopenia    Past Surgical History:  Procedure Laterality Date  . APPENDECTOMY    . EYE SURGERY     cataract  . SPINE SURGERY     lumbar lamenectomy    There were no vitals filed for this visit.      Subjective Assessment - 05/30/16 1534    Subjective Pt reports no pain at rest but increase pain upon initiating movement. Reports 5-6/10 upon initiating movement.   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 no problem    How long can you walk comfortably? around [employer] pushing cart   Diagnostic tests none   Patient Stated Goals get rid of pain; resume normal activities   Currently in Pain? No/denies  increased pain with movement                         [facility] Adult PT Treatment/Exercise - 05/30/16 0001      Exercises   Exercises Knee/Hip     Knee/Hip Exercises: Aerobic   Nustep L1 x4 minutes     Knee/Hip Exercises: Supine   Quad Sets Strengthening;Right;1 set;10 reps  lots of verbal and tactil cueing   Short Arc Quad Sets Strengthening;Right;1 set;20 reps  using pool noodle    Bridges with [employer] --   Ball squeeze x10 3 second holds   Other Supine Knee/Hip Exercises iliopsoas flexion  supine, hip/knee 90/90 x20     Knee/Hip Exercises: Sidelying   Clams 10 Lt side lying     Manual Therapy   Manual Therapy Soft tissue mobilization;Taping   Manual therapy comments Tape to   Soft tissue mobilization To Rt glutes and hamstrings  Orange smooth ball                  PT Short Term Goals - 05/30/16 1619      PT SHORT TERM GOAL #1   Title Patient will be report a 25% improvement in pain with usual ADLS  [id number]   Time 4   Period Weeks   Status On-going     PT SHORT TERM GOAL #2   Title The patient will be able to rise from the chair and walk short distance (timed up and go test) 13 sec or less   Time 4   Period Weeks   Status On-going     PT SHORT TERM GOAL #3   Title Improved right HS length to 65 degrees and hip extension to 10 degrees needed for normal stride length  to walk 1/2 mile   Time 4   Period Weeks   Status On-going           PT Long Term Goals - 05/30/16 1619      PT LONG TERM GOAL #1   Title independent with HEP needed for further improvements in ROM and strength right hip   07/21/16   Time 8   Period Weeks   Status On-going     PT LONG TERM GOAL #2   Title The patient will report a > 50% improvement in pain with rising from a chair   Time 8   Period Weeks   Status On-going     PT LONG TERM GOAL #3   Title The patient will have improved right hip abduction and hip flexion strength to 4/5 needed for ascending steps to laundry with greater ease   Time 8   Period Weeks   Status On-going     PT LONG TERM GOAL #4   Title FOTO functional outcome score improved from 50% limitation to 31% indicating improved function with less pain   Time 8   Period Weeks   Status On-going               Plan - 05/30/16 1710    Clinical Impression Statement Pt presents with Rt hip pain anteriorly. Pt has difficulty with sit to stand and initiating  movement in hip flexion. Pt having difficulty activating quad to perform quad set. Pt able to tolerate all exercises and stretches well. Kinesio tape to antirior hip to promote quad activation at hip. Pt will conitue to beenfit from hip strenghtening and and flexibility.    Rehab Potential Good   Clinical Impairments Affecting Rehab Potential RA, osteopenia   PT Frequency 2x / week   PT Duration 8 weeks   PT Treatment/Interventions ADLs/Self Care Home Management;Cryotherapy;Electrical Stimulation;Iontophoresis 4mg /ml Dexamethasone;Moist Heat;Ultrasound;Patient/family education;Therapeutic exercise;Manual techniques;Taping;Dry needling   PT Next Visit Plan do TUG test;  ionto order on initial PT order;  gentle hip joint mobs;  modalities as needed for pain   Consulted and Agree with Plan of Care Patient      Patient will benefit from skilled therapeutic intervention in order to improve the following deficits and impairments:  Decreased activity tolerance, Decreased range of motion, Decreased strength, Increased fascial restricitons, Increased muscle spasms, Pain, Impaired flexibility  Visit Diagnosis: Pain in right hip  Muscle weakness (generalized)  Stiffness of right hip, not elsewhere classified  Radiculopathy, lumbar region     Problem List There are no active problems to display for this patient.   Mikle Bosworth PTA 05/30/2016, 5:17 PM  [facility] [address], [place], [place], [address] Phone: [phone]   Fax:  [phone]  Name: Christina Henderson MRN: [id number] Date of Birth: 04-Dec-1935

## 2016-06-07 ENCOUNTER — Ambulatory Visit: Payer: Medicare Other | Admitting: Physical Therapy

## 2016-06-07 DIAGNOSIS — M5416 Radiculopathy, lumbar region: Secondary | ICD-10-CM | POA: Diagnosis not present

## 2016-06-07 DIAGNOSIS — M6281 Muscle weakness (generalized): Secondary | ICD-10-CM | POA: Diagnosis not present

## 2016-06-07 DIAGNOSIS — M25551 Pain in right hip: Secondary | ICD-10-CM

## 2016-06-07 DIAGNOSIS — M25651 Stiffness of right hip, not elsewhere classified: Secondary | ICD-10-CM | POA: Diagnosis not present

## 2016-06-07 NOTE — Patient Instructions (Addendum)

## 2016-06-07 NOTE — Therapy (Signed)
[facility] [address] [place], [place], [address] Phone: [phone]   Fax:  [phone]  Physical Therapy Treatment  Patient Details  Name: Christina Henderson MRN: [id number] Date of Birth: 1936-06-21 Referring Provider: Dr. Addison Lank  Encounter Date: 06/07/2016      PT End of Session - 06/07/16 1051    Visit Number 3   Date for PT Re-Evaluation 07/21/16   Authorization Type Medicare G code: KX at visit 15   PT Start Time 1015   PT Stop Time 1100   PT Time Calculation (min) 45 min   Activity Tolerance Patient tolerated treatment well      Past Medical History:  Diagnosis Date  . Arthritis    rhuematoid arthritis  . Glaucoma   . Hypertension   . Osteoporosis    osteopenia    Past Surgical History:  Procedure Laterality Date  . APPENDECTOMY    . EYE SURGERY     cataract  . SPINE SURGERY     lumbar lamenectomy    There were no vitals filed for this visit.      Subjective Assessment - 06/07/16 1020    Subjective I was really walking well but I drove home from [place] yesterday for 6 days yesterday.  Just stiff this AM.  I was in rough shape after last PT visit.  I think it was too many reps.  Anterior and posterior right hip.  Overall it is getting better.     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 No/denies   Pain Score 1    Pain Location Hip   Pain Orientation Right   Pain Descriptors / Indicators Aching;Sore   Pain Type Acute pain            [facility] PT Assessment - 06/07/16 0001      Timed Up and Go Test   Manual TUG (seconds) 13.2                     [facility] Adult PT Treatment/Exercise - 06/07/16 0001      Knee/Hip Exercises: Stretches   Active Hamstring Stretch Left;3 reps;30 seconds   Active Hamstring Stretch Limitations with strap on left, and right leg out straight for hip  flexor stretch   Piriformis Stretch Right;3 reps;30 seconds   Piriformis Stretch Limitations with red ball    Other Knee/Hip Stretches psoas doorway stretch 3x5 right and left with UE movements     Knee/Hip Exercises: Aerobic   Nustep L1 6 min     Knee/Hip Exercises: Supine   Bridges Limitations over red ball 10x     Knee/Hip Exercises: Sidelying   Clams 10 Lt side lying     Iontophoresis   Type of Iontophoresis Dexamethasone   Location right prox hip flexor   Dose 4 mg/ml   Time 4-6 hour patch                PT Education - 06/07/16 1736    Education provided Yes   Education Details seated hip flexor stretch;  ionto info   Person(s) Educated Patient   Methods Explanation;Demonstration;Handout   Comprehension Verbalized understanding;Returned demonstration          PT Short Term Goals - 06/07/16 1741      PT SHORT TERM GOAL #1   Title Patient will be report a 25%  improvement in pain with usual ADLS  51/70/01   Time 4   Period Weeks   Status On-going     PT SHORT TERM GOAL #2   Title The patient will be able to rise from the chair and walk short distance (timed up and go test) 13 sec or less   Time 4   Period Weeks   Status Partially Met     PT SHORT TERM GOAL #3   Title Improved right HS length to 65 degrees and hip extension to 10 degrees needed for normal stride length to walk 1/2 mile   Time 4   Period Weeks   Status On-going           PT Long Term Goals - 06/07/16 1742      PT LONG TERM GOAL #1   Title independent with HEP needed for further improvements in ROM and strength right hip   07/21/16   Time 8   Period Weeks   Status On-going     PT LONG TERM GOAL #2   Title The patient will report a > 50% improvement in pain with rising from a chair   Time 8   Period Weeks   Status On-going     PT LONG TERM GOAL #3   Title The patient will have improved right hip abduction and hip flexion strength to 4/5 needed for ascending steps to  laundry with greater ease   Time 8   Period Weeks   Status On-going     PT LONG TERM GOAL #4   Title FOTO functional outcome score improved from 50% limitation to 31% indicating improved function with less pain   Time 8   Period Weeks   Status On-going               Plan - 06/07/16 1737    Clinical Impression Statement The patient reports decreasing right anterior and posterior hip pain overall.  She is able to rise from the chair and ambulate with much improved dexterity and speed, fewer pain behaviors.  Improving hip flexor muscle length with increased hip extension noted.  Pain is still produced with activation of hip flexors.  Trial of ionto initiated. Therapist closely monitoring response with all interventions.     PT Next Visit Plan assess response to ionto #1;  add low level hip flexor and quad strengthening; continue hip flexor stretching;  check % improve for STG and HS lengths      Patient will benefit from skilled therapeutic intervention in order to improve the following deficits and impairments:     Visit Diagnosis: Pain in right hip  Muscle weakness (generalized)  Stiffness of right hip, not elsewhere classified  Radiculopathy, lumbar region     Problem List There are no active problems to display for this patient. Ruben Im, PT 06/07/16 5:43 PM Phone: [phone] Fax: [phone]  Christina Henderson 06/07/2016, 5:43 PM  [facility] [address], [place], [place], [address] Phone: [phone]   Fax:  [phone]  Name: Christina Henderson MRN: [id number] Date of Birth: 03/29/1936

## 2016-06-09 ENCOUNTER — Encounter: Payer: Self-pay | Admitting: Physical Therapy

## 2016-06-09 ENCOUNTER — Ambulatory Visit: Payer: Medicare Other | Admitting: Physical Therapy

## 2016-06-09 DIAGNOSIS — M25551 Pain in right hip: Secondary | ICD-10-CM

## 2016-06-09 DIAGNOSIS — M6281 Muscle weakness (generalized): Secondary | ICD-10-CM | POA: Diagnosis not present

## 2016-06-09 DIAGNOSIS — M25651 Stiffness of right hip, not elsewhere classified: Secondary | ICD-10-CM | POA: Diagnosis not present

## 2016-06-09 DIAGNOSIS — M5416 Radiculopathy, lumbar region: Secondary | ICD-10-CM

## 2016-06-09 DIAGNOSIS — M67442 Ganglion, left hand: Secondary | ICD-10-CM | POA: Diagnosis not present

## 2016-06-09 NOTE — Therapy (Signed)
[facility] [address] [place], [place], [address] Phone: [phone]   Fax:  [phone]  Physical Therapy Treatment  Patient Details  Name: Christina Henderson MRN: [id number] Date of Birth: 03-27-1936 Referring Provider: Dr. Addison Lank  Encounter Date: 06/09/2016      PT End of Session - 06/09/16 1242    Visit Number 4   Date for PT Re-Evaluation 07/21/16   Authorization Type Medicare G code: KX at visit 15   PT Start Time 1230   PT Stop Time 1313   PT Time Calculation (min) 43 min   Activity Tolerance Patient tolerated treatment well      Past Medical History:  Diagnosis Date  . Arthritis    rhuematoid arthritis  . Glaucoma   . Hypertension   . Osteoporosis    osteopenia    Past Surgical History:  Procedure Laterality Date  . APPENDECTOMY    . EYE SURGERY     cataract  . SPINE SURGERY     lumbar lamenectomy    There were no vitals filed for this visit.      Subjective Assessment - 06/09/16 1234    Subjective Pt reports having some increase posterior hip and low back pain today after stretching. Had increased posterior hip pain upon waking this morning but is a littel better now.    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 no problem    How long can you walk comfortably? around [employer] pushing cart   Diagnostic tests none   Patient Stated Goals get rid of pain; resume normal activities   Currently in Pain? Yes   Pain Score 2    Pain Location Hip   Pain Orientation Right;Posterior   Pain Descriptors / Indicators Aching;Sore   Pain Onset 1 to 4 weeks ago   Pain Frequency Intermittent   Aggravating Factors  Rising from sitting                         [facility] Adult PT Treatment/Exercise - 06/09/16 0001      Knee/Hip  Exercises: Aerobic   Nustep L1 6 min     Knee/Hip Exercises: Supine   Bridges with Clamshell Strengthening;Both;2 sets;10 reps  Unable due to pain   Single Leg Bridge --   Other Supine Knee/Hip Exercises Glute set  5 times 5 second holds     Knee/Hip Exercises: Prone   Hamstring Curl 1 set;10 reps   Hip Extension Strengthening;Both;1 set;10 reps     Iontophoresis   Type of Iontophoresis Dexamethasone  #2   Location right piriformis   Dose 4 mg/ml   Time 4-6 hour patch     Manual Therapy   Manual Therapy Soft tissue mobilization   Manual therapy comments Pt prone   Soft tissue mobilization To Rt gluteal, piriformis                PT Education - 06/09/16 1314    Education provided Yes   Education Details Glute strengthening   Person(s) Educated Patient   Methods Explanation;Demonstration;Handout   Comprehension Verbalized understanding          PT Short Term Goals - 06/07/16 1741      PT SHORT TERM GOAL #1   Title Patient will be report a 25%  improvement in pain with usual ADLS  62/95/28   Time 4   Period Weeks   Status On-going     PT SHORT TERM GOAL #2   Title The patient will be able to rise from the chair and walk short distance (timed up and go test) 13 sec or less   Time 4   Period Weeks   Status Partially Met     PT SHORT TERM GOAL #3   Title Improved right HS length to 65 degrees and hip extension to 10 degrees needed for normal stride length to walk 1/2 mile   Time 4   Period Weeks   Status On-going           PT Long Term Goals - 06/07/16 1742      PT LONG TERM GOAL #1   Title independent with HEP needed for further improvements in ROM and strength right hip   07/21/16   Time 8   Period Weeks   Status On-going     PT LONG TERM GOAL #2   Title The patient will report a > 50% improvement in pain with rising from a chair   Time 8   Period Weeks   Status On-going     PT LONG TERM GOAL #3   Title The patient will have improved  right hip abduction and hip flexion strength to 4/5 needed for ascending steps to laundry with greater ease   Time 8   Period Weeks   Status On-going     PT LONG TERM GOAL #4   Title FOTO functional outcome score improved from 50% limitation to 31% indicating improved function with less pain   Time 8   Period Weeks   Status On-going               Plan - 06/09/16 1324    Clinical Impression Statement Pt reports having less anterior hip pain but increased low back and posterior hip pain. Pt has decreased Rt glute activation. Tendernes in Rt piriformis with manual massage. Pt will continue to benefit from skilled therapy for hip strengthening and stabilization.    Rehab Potential Good   Clinical Impairments Affecting Rehab Potential RA, osteopenia   PT Frequency 2x / week   PT Duration 8 weeks   PT Treatment/Interventions ADLs/Self Care Home Management;Cryotherapy;Electrical Stimulation;Iontophoresis 34m/ml Dexamethasone;Moist Heat;Ultrasound;Patient/family education;Therapeutic exercise;Manual techniques;Taping;Dry needling   PT Next Visit Plan Glute activation exercises, LE strengthening, ionot #3   Consulted and Agree with Plan of Care Patient      Patient will benefit from skilled therapeutic intervention in order to improve the following deficits and impairments:  Decreased activity tolerance, Decreased range of motion, Decreased strength, Increased fascial restricitons, Increased muscle spasms, Pain, Impaired flexibility  Visit Diagnosis: Pain in right hip  Muscle weakness (generalized)  Stiffness of right hip, not elsewhere classified  Radiculopathy, lumbar region     Problem List There are no active problems to display for this patient.   KMikle BosworthPTA 06/09/2016, 1:35 PM  [facility] [address] [place] [address]Phone: [phone]  Fax:  [phone] Name: Christina GoodgameMRN: [id number] of Birth: 714-Feb-1937

## 2016-06-09 NOTE — Patient Instructions (Signed)
Buttocks    Lying face down, inhale. Keeping leg straight, exhale while lifting leg just off floor. Hold 1 count. Slowly return to starting position. Repeat ____ times each leg. Do ____ sets per session. Do ____ sessions per day. Do with ____ pound ankle weights.  Copyright  VHI. All rights reserved.Gluteal Sets    Tighten buttocks while pressing pelvis to floor. Hold ____ seconds. Repeat ____ times per set. Do ____ sets per session. Do ____ sessions per day.  http://orth.exer.us/105   Copyright  VHI. All rights reserved.

## 2016-06-14 ENCOUNTER — Ambulatory Visit: Payer: Medicare Other | Attending: Family Medicine | Admitting: Physical Therapy

## 2016-06-14 ENCOUNTER — Encounter: Payer: Self-pay | Admitting: Physical Therapy

## 2016-06-14 DIAGNOSIS — M5416 Radiculopathy, lumbar region: Secondary | ICD-10-CM | POA: Diagnosis not present

## 2016-06-14 DIAGNOSIS — M25551 Pain in right hip: Secondary | ICD-10-CM | POA: Diagnosis not present

## 2016-06-14 DIAGNOSIS — M25651 Stiffness of right hip, not elsewhere classified: Secondary | ICD-10-CM | POA: Insufficient documentation

## 2016-06-14 DIAGNOSIS — M6281 Muscle weakness (generalized): Secondary | ICD-10-CM | POA: Insufficient documentation

## 2016-06-14 NOTE — Therapy (Signed)
[facility] [address], [place], [place], [address] Phone: [phone]   Fax:  [phone]  Physical Therapy Treatment  Patient Details  Name: Christina Henderson MRN: [id number] Date of Birth: [birth year] Referring Provider: Dr. Addison Lank  Encounter Date: 06/14/2016      PT End of Session - 06/14/16 1410    Visit Number 5   Date for PT Re-Evaluation 07/21/16   Authorization Type Medicare G code: KX at visit 15   PT Start Time 1406   PT Stop Time 1500   PT Time Calculation (min) 54 min   Activity Tolerance Patient tolerated treatment well      Past Medical History:  Diagnosis Date  . Arthritis    rhuematoid arthritis  . Glaucoma   . Hypertension   . Osteoporosis    osteopenia    Past Surgical History:  Procedure Laterality Date  . APPENDECTOMY    . EYE SURGERY     cataract  . SPINE SURGERY     lumbar lamenectomy    There were no vitals filed for this visit.      Subjective Assessment - 06/14/16 1409    Subjective Pt reports having worse pain today. Ttried to do home exercises and pt stated made pain worse.   Limitations House hold activities;Walking;Standing   How long can you sit comfortably? no problem    How long can you walk comfortably? around [employer] pushing cart   Diagnostic tests none   Patient Stated Goals get rid of pain; resume normal activities   Currently in Pain? Yes   Pain Score 8    Pain Location Hip   Pain Orientation Right   Pain Descriptors / Indicators Aching;Sore   Pain Type Acute pain   Pain Onset 1 to 4 weeks ago   Pain Frequency Intermittent                         [facility] Adult PT Treatment/Exercise - 06/14/16 0001      Knee/Hip Exercises: Stretches   Passive Hamstring Stretch Right;3 reps;10 seconds   Knee: Self-Stretch to increase Flexion 2 reps;20 seconds   Piriformis Stretch Right;3 reps;10 seconds     Knee/Hip Exercises: Aerobic   Nustep L1 4 min   Therapist present to discuss treatment     Modalities   Modalities Electrical Stimulation;Moist Heat     Moist Heat Therapy   Number Minutes Moist Heat 20 Minutes   Moist Heat Location Hip  Right     Electrical Stimulation   Electrical Stimulation Location Rt hip   Electrical Stimulation Action IFC   Electrical Stimulation Parameters To tolerance 20 Minutes   Electrical Stimulation Goals Pain     Iontophoresis   Type of Iontophoresis Dexamethasone  #3   Location right piriformis   Dose 4 mg/ml   Time 4-6 hour patch     Manual Therapy   Manual Therapy Soft tissue mobilization   Manual therapy comments Pt prone   Soft tissue mobilization To Rt gluteal, piriformis                  PT Short Term Goals - 06/14/16 1410      PT SHORT TERM GOAL #1   Title Patient will be report a 25% improvement in pain with usual ADLS  59/45/85   Time 4   Period Weeks   Status On-going     PT SHORT TERM GOAL #2  Title The patient will be able to rise from the chair and walk short distance (timed up and go test) 13 sec or less   Time 4   Period Weeks   Status Partially Met     PT SHORT TERM GOAL #3   Title Improved right HS length to 65 degrees and hip extension to 10 degrees needed for normal stride length to walk 1/2 mile   Time 4   Period Weeks   Status On-going           PT Long Term Goals - 06/14/16 1410      PT LONG TERM GOAL #1   Title independent with HEP needed for further improvements in ROM and strength right hip   07/21/16   Time 8   Period Weeks   Status On-going     PT LONG TERM GOAL #2   Title The patient will report a > 50% improvement in pain with rising from a chair   Time 8   Period Weeks   Status On-going     PT LONG TERM GOAL #3   Title The patient will have improved right hip abduction and hip flexion strength to 4/5 needed for ascending steps to laundry with greater ease   Time 8   Period Weeks   Status On-going     PT LONG TERM  GOAL #4   Title FOTO functional outcome score improved from 50% limitation to 31% indicating improved function with less pain   Time 8   Period Weeks   Status On-going               Plan - 06/14/16 1438    Clinical Impression Statement Pt presents to PT clinic with increased posterior hip pain. Pt has increased antalgic gait due to pain with decreased hip ROM. Pt able to tolerate light passive stretches to Rt hip. Tenderness in Rt piriformis and glute with manual soft tissue mobilization. Will discontinue prone hip extension exercises due to patients increase in pain. Strengthening focus on core and in flexion with light stretching to tolerance.    Rehab Potential Good   Clinical Impairments Affecting Rehab Potential RA, osteopenia   PT Frequency 2x / week   PT Duration 8 weeks   PT Treatment/Interventions ADLs/Self Care Home Management;Cryotherapy;Electrical Stimulation;Iontophoresis 25m/ml Dexamethasone;Moist Heat;Ultrasound;Patient/family education;Therapeutic exercise;Manual techniques;Taping;Dry needling   PT Next Visit Plan Rt hip gentle stretching, low level core activation, ionoto #4   Consulted and Agree with Plan of Care Patient      Patient will benefit from skilled therapeutic intervention in order to improve the following deficits and impairments:  Decreased activity tolerance, Decreased range of motion, Decreased strength, Increased fascial restricitons, Increased muscle spasms, Pain, Impaired flexibility  Visit Diagnosis: Pain in right hip  Muscle weakness (generalized)  Stiffness of right hip, not elsewhere classified  Radiculopathy, lumbar region     Problem List There are no active problems to display for this patient.   KMikle BosworthPTA 06/14/2016, 5:00 PM  [facility] [address] [place] [address]Phone: [phone]  Fax:  [phone] Name: Christina MarseeMRN:  [id number] of Birth: [birth year]

## 2016-06-16 ENCOUNTER — Ambulatory Visit: Payer: Medicare Other | Admitting: Physical Therapy

## 2016-06-16 DIAGNOSIS — M25651 Stiffness of right hip, not elsewhere classified: Secondary | ICD-10-CM

## 2016-06-16 DIAGNOSIS — M6281 Muscle weakness (generalized): Secondary | ICD-10-CM

## 2016-06-16 DIAGNOSIS — M5416 Radiculopathy, lumbar region: Secondary | ICD-10-CM

## 2016-06-16 DIAGNOSIS — M25551 Pain in right hip: Secondary | ICD-10-CM

## 2016-06-16 NOTE — Patient Instructions (Signed)
     Trigger Point Dry Needling  . What is Trigger Point Dry Needling (DN)? o DN is a physical therapy technique used to treat muscle pain and dysfunction. Specifically, DN helps deactivate muscle trigger points (muscle knots).  o A thin filiform needle is used to penetrate the skin and stimulate the underlying trigger point. The goal is for a local twitch response (LTR) to occur and for the trigger point to relax. No medication of any kind is injected during the procedure.   . What Does Trigger Point Dry Needling Feel Like?  o The procedure feels different for each individual patient. Some patients report that they do not actually feel the needle enter the skin and overall the process is not painful. Very mild bleeding may occur. However, many patients feel a deep cramping in the muscle in which the needle was inserted. This is the local twitch response.   . How Will I feel after the treatment? o Soreness is normal, and the onset of soreness may not occur for a few hours. Typically this soreness does not last longer than two days.  o Bruising is uncommon, however; ice can be used to decrease any possible bruising.  o In rare cases feeling tired or nauseous after the treatment is normal. In addition, your symptoms may get worse before they get better, this period will typically not last longer than 24 hours.   . What Can I do After My Treatment? o Increase your hydration by drinking more water for the next 24 hours. o You may place ice or heat on the areas treated that have become sore, however, do not use heat on inflamed or bruised areas. Heat often brings more relief post needling. o You can continue your regular activities, but vigorous activity is not recommended initially after the treatment for 24 hours. o DN is best combined with other physical therapy such as strengthening, stretching, and other therapies.    Worthington Cruzan PT [facility] [address], [address] [place], [place] [address] Phone # [phone] Fax [phone] 

## 2016-06-16 NOTE — Therapy (Signed)
[facility] [address], [place], [place], [address] Phone: [phone]   Fax:  [phone]  Physical Therapy Treatment  Patient Details  Name: Christina Henderson MRN: [id number] Date of Birth: December 10, 1935 Referring Provider: Dr. Addison Lank  Encounter Date: 06/16/2016      PT End of Session - 06/16/16 1057    Visit Number 6   Date for PT Re-Evaluation 07/21/16   Authorization Type Medicare G code: KX at visit 15   PT Start Time 1016   PT Stop Time 1110   PT Time Calculation (min) 54 min   Activity Tolerance Patient tolerated treatment well      Past Medical History:  Diagnosis Date  . Arthritis    rhuematoid arthritis  . Glaucoma   . Hypertension   . Osteoporosis    osteopenia    Past Surgical History:  Procedure Laterality Date  . APPENDECTOMY    . EYE SURGERY     cataract  . SPINE SURGERY     lumbar lamenectomy    There were no vitals filed for this visit.      Subjective Assessment - 06/16/16 1017    Subjective (P)  I'm feeling better.  I noticed something interesting.  I stood more yesterday and my back didn't hurt.  Dull ache low back.  Pain with rising from a chair.  Bilateral buttock pain with sitting/rising.  No more anterior hip pain.     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 (P)  Yes   Pain Score (P)  2    Pain Location (P)  Buttocks   Pain Orientation (P)  Right;Left                         [facility] Adult PT Treatment/Exercise - 06/16/16 0001      Moist Heat Therapy   Number Minutes Moist Heat 15 Minutes   Moist Heat Location Lumbar Spine;Hip     Electrical Stimulation   Electrical Stimulation Location Rt hip   Electrical Stimulation Action IFC   Electrical Stimulation Parameters 15 min intensity to tolerance   Electrical Stimulation Goals Pain     Manual Therapy   Manual Therapy Joint mobilization;Soft tissue mobilization;Myofascial release;Muscle Energy Technique   Joint Mobilization bilateral hip mobs grade 3 3x 30 sec long axis distraction, inferior mobs   Soft tissue mobilization to bilateral gluteals, piriformis and lumbar paraspinals   Muscle Energy Technique contract relax piriformis 3x 5 right and left          Trigger Point Dry Needling - 06/16/16 1056    Consent Given? Yes   Muscles Treated Lower Body Gluteus minimus;Gluteus maximus;Piriformis  bilateral lumbar multifidi   Gluteus Maximus Response Twitch response elicited;Palpable increased muscle length   Gluteus Minimus Response Twitch response elicited;Palpable increased muscle length   Piriformis Response Twitch response elicited;Palpable increased muscle length              PT Education - 06/16/16 1057    Education provided Yes   Education Details dry needling after care   Person(s) Educated Patient   Methods Handout;Explanation   Comprehension Verbalized understanding          PT Short Term Goals - 06/16/16 2226      PT SHORT TERM GOAL #1   Title Patient will be report a  25% improvement in pain with usual ADLS  89/37/34   Time 4   Period Weeks   Status On-going     PT SHORT TERM GOAL #2   Title The patient will be able to rise from the chair and walk short distance (timed up and go test) 13 sec or less   Time 4   Period Weeks   Status Partially Met     PT SHORT TERM GOAL #3   Title Improved right HS length to 65 degrees and hip extension to 10 degrees needed for normal stride length to walk 1/2 mile   Time 4   Period Weeks   Status On-going           PT Long Term Goals - 06/16/16 2227      PT LONG TERM GOAL #1   Title independent with HEP needed for further improvements in ROM and strength right hip   07/21/16   Time 8   Period Weeks   Status On-going     PT LONG TERM GOAL #2   Title The patient will report a > 50% improvement in pain with  rising from a chair   Time 8   Period Weeks   Status On-going     PT LONG TERM GOAL #3   Title The patient will have improved right hip abduction and hip flexion strength to 4/5 needed for ascending steps to laundry with greater ease   Time 8   Period Weeks   Status On-going     PT LONG TERM GOAL #4   Title FOTO functional outcome score improved from 50% limitation to 31% indicating improved function with less pain   Time 8   Period Weeks   Status On-going               Plan - 06/16/16 1103    Clinical Impression Statement The patient's initial pain of right anterior hip pain has improved however back pain and bilateral back pain reported especially with rising from seated position.  Tender points in gluteals and piriformis muscles which may be contributing to pain with rising.   Improved soft tissue length following dry needling and manual therapy.  Therapist closely monitoring response with all treatment interventions.     PT Next Visit Plan assess response to first time dry needling and hip mobilizations;  resume ionto if needed;  core strengthening;  gluteal strengthening.      Patient will benefit from skilled therapeutic intervention in order to improve the following deficits and impairments:     Visit Diagnosis: Pain in right hip  Muscle weakness (generalized)  Stiffness of right hip, not elsewhere classified  Radiculopathy, lumbar region     Problem List There are no active problems to display for this patient.  Ruben Im, PT 06/16/16 10:28 PM Phone: [phone] Fax: [phone] Alvera Singh 06/16/2016, 10:28 PM  [facility] [address], [place], [place], [address] Phone: [phone]   Fax:  [phone]  Name: Charika Mikelson MRN: [id number] Date of Birth: 05/20/1936

## 2016-06-21 ENCOUNTER — Ambulatory Visit: Payer: Medicare Other | Admitting: Physical Therapy

## 2016-06-21 DIAGNOSIS — M5416 Radiculopathy, lumbar region: Secondary | ICD-10-CM | POA: Diagnosis not present

## 2016-06-21 DIAGNOSIS — M6281 Muscle weakness (generalized): Secondary | ICD-10-CM | POA: Diagnosis not present

## 2016-06-21 DIAGNOSIS — M25551 Pain in right hip: Secondary | ICD-10-CM

## 2016-06-21 DIAGNOSIS — M25651 Stiffness of right hip, not elsewhere classified: Secondary | ICD-10-CM | POA: Diagnosis not present

## 2016-06-21 NOTE — Patient Instructions (Signed)
Christina Henderson PT [facility] [address], [address] [place], [place] [address] Phone # [phone] Fax [phone]    

## 2016-06-21 NOTE — Therapy (Signed)
[facility] [address] [place], [place], [address] Phone: [phone]   Fax:  [phone]  Physical Therapy Treatment  Patient Details  Name: Christina Henderson MRN: [id number] Date of Birth: August 24, 1935 Referring Provider: Dr. Addison Lank  Encounter Date: 06/21/2016      PT End of Session - 06/21/16 1715    Visit Number 7   Date for PT Re-Evaluation 07/21/16   Authorization Type Medicare G code: KX at visit 15   PT Start Time 1535   PT Stop Time 1625   PT Time Calculation (min) 50 min   Activity Tolerance Patient tolerated treatment well      Past Medical History:  Diagnosis Date  . Arthritis    rhuematoid arthritis  . Glaucoma   . Hypertension   . Osteoporosis    osteopenia    Past Surgical History:  Procedure Laterality Date  . APPENDECTOMY    . EYE SURGERY     cataract  . SPINE SURGERY     lumbar lamenectomy    There were no vitals filed for this visit.      Subjective Assessment - 06/21/16 1533    Subjective (P)  Sore on Saturday.  On Sunday was very busy I overdid it and I was in a lot of pain  yesterday.  The problem is sitting.  I was better after the needling until I overdid it.     Currently in Pain? (P)  Yes   Pain Score (P)  4    Pain Location (P)  Back   Pain Orientation (P)  Right   Pain Type (P)  Acute pain                         [facility] Adult PT Treatment/Exercise - 06/21/16 0001      Lumbar Exercises: Supine   Ab Set 10 reps   Isometric Hip Flexion 10 reps     Knee/Hip Exercises: Supine   Other Supine Knee/Hip Exercises sit to stand with abdominal brace 8x     Moist Heat Therapy   Number Minutes Moist Heat 15 Minutes   Moist Heat Location Lumbar Spine     Electrical Stimulation   Electrical Stimulation Location bilateral lumbar   Electrical Stimulation Action IFC   Electrical Stimulation Parameters 10 ma 15 min sidelying   Electrical Stimulation Goals Pain      Manual Therapy   Joint Mobilization bilateral hip mobs grade 3 3x 30 sec long axis distraction, inferior mobs   Soft tissue mobilization to bilateral gluteals, piriformis and lumbar paraspinals          Trigger Point Dry Needling - 06/21/16 1719    Consent Given? Yes   Muscles Treated Lower Body --  bilateral lumbar multifidi   Gluteus Maximus Response Twitch response elicited;Palpable increased muscle length   Piriformis Response Twitch response elicited;Palpable increased muscle length              PT Education - 06/21/16 1714    Education provided Yes   Education Details abdominal brace in supine;  ab brace with hand to knee push   Person(s) Educated Patient   Methods Explanation;Demonstration;Handout   Comprehension Verbalized understanding;Returned demonstration          PT Short Term Goals - 06/21/16 2117      PT SHORT TERM GOAL #1   Title Patient will be report a 25% improvement in pain with usual ADLS  06/23/16     PT SHORT TERM GOAL #2   Title The patient will be able to rise from the chair and walk short distance (timed up and go test) 13 sec or less   Period Weeks   Status Partially Met     PT SHORT TERM GOAL #3   Title Improved right HS length to 65 degrees and hip extension to 10 degrees needed for normal stride length to walk 1/2 mile   Time 4   Period Weeks   Status On-going           PT Long Term Goals - 06/21/16 2118      PT LONG TERM GOAL #1   Title independent with HEP needed for further improvements in ROM and strength right hip   07/21/16   Period Weeks   Status On-going     PT LONG TERM GOAL #2   Title The patient will report a > 50% improvement in pain with rising from a chair   Time 8   Period Weeks   Status On-going     PT LONG TERM GOAL #3   Title The patient will have improved right hip abduction and hip flexion strength to 4/5 needed for ascending steps to laundry with greater ease   Time 8   Period Weeks    Status On-going     PT LONG TERM GOAL #4   Title FOTO functional outcome score improved from 50% limitation to 31% indicating improved function with less pain   Time 8   Period Weeks   Status On-going               Plan - 06/21/16 2113    Clinical Impression Statement Progress has been variable with complaint of pain location changing from anterior hip initially to posterior hip and now bilateral low back pain.  Initiated lower abdominal transversus abdominus muscle activation.  Needs verbal and tactile cues for proper muscle activation and to avoid holding her breath.  Improved soft tissue length following dry needling and manual therapy.  Therapist closely monitoring response with all treatment interventions.     PT Next Visit Plan check progress with STGs next visit including TUG test;  assess response to dry needling #2;  continue abdominal brace progression in supine, sitting and standing;  hip mobilizations;  e-stim/heat for pain control      Patient will benefit from skilled therapeutic intervention in order to improve the following deficits and impairments:     Visit Diagnosis: Pain in right hip  Muscle weakness (generalized)  Stiffness of right hip, not elsewhere classified  Radiculopathy, lumbar region     Problem List There are no active problems to display for this patient.  Ruben Im, PT 06/21/16 9:20 PM Phone: [phone] Fax: [phone]  Alvera Singh 06/21/2016, 9:19 PM  [facility] [address] [place], [place], [address] Phone: [phone]   Fax:  [phone]  Name: Christina Henderson MRN: [id number] Date of Birth: 10-Nov-1935

## 2016-06-23 ENCOUNTER — Encounter: Payer: Self-pay | Admitting: Physical Therapy

## 2016-06-23 ENCOUNTER — Ambulatory Visit: Payer: Medicare Other | Admitting: Physical Therapy

## 2016-06-23 DIAGNOSIS — M6281 Muscle weakness (generalized): Secondary | ICD-10-CM | POA: Diagnosis not present

## 2016-06-23 DIAGNOSIS — M25651 Stiffness of right hip, not elsewhere classified: Secondary | ICD-10-CM | POA: Diagnosis not present

## 2016-06-23 DIAGNOSIS — M25551 Pain in right hip: Secondary | ICD-10-CM | POA: Diagnosis not present

## 2016-06-23 DIAGNOSIS — M5416 Radiculopathy, lumbar region: Secondary | ICD-10-CM | POA: Diagnosis not present

## 2016-06-23 NOTE — Therapy (Signed)
[facility] [address], [place], [place], [address] Phone: [phone]   Fax:  [phone]  Physical Therapy Treatment  Patient Details  Name: Christina Henderson MRN: [id number] Date of Birth: 07-Oct-1935 Referring Provider: Dr. Addison Lank  Encounter Date: 06/23/2016      PT End of Session - 06/23/16 1136    Visit Number 8   Date for PT Re-Evaluation 07/21/16   Authorization Type Medicare G code: KX at visit 15   PT Start Time 1104   PT Stop Time 1145   PT Time Calculation (min) 41 min   Activity Tolerance Patient tolerated treatment well      Past Medical History:  Diagnosis Date  . Arthritis    rhuematoid arthritis  . Glaucoma   . Hypertension   . Osteoporosis    osteopenia    Past Surgical History:  Procedure Laterality Date  . APPENDECTOMY    . EYE SURGERY     cataract  . SPINE SURGERY     lumbar lamenectomy    There were no vitals filed for this visit.      Subjective Assessment - 06/23/16 1106    Subjective Pt having some soreness still from dry needling. Reports low back pain and Rt hip pain.    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 no problem    How long can you walk comfortably? around [employer] pushing cart   Diagnostic tests none   Patient Stated Goals get rid of pain; resume normal activities   Currently in Pain? Yes   Pain Score 2    Pain Location Hip   Pain Orientation Right   Pain Descriptors / Indicators Aching;Sore   Pain Type Acute pain   Pain Onset 1 to 4 weeks ago   Pain Frequency Intermittent   Aggravating Factors  Rising from sitting   Pain Relieving Factors Lying down; medicine for RA; sitting            [facility] PT Assessment - 06/23/16 0001      Timed Up and Go Test   Manual TUG (seconds) 15                      [facility] Adult PT Treatment/Exercise - 06/23/16 0001      Knee/Hip Exercises: Stretches   Active Hamstring Stretch Left;3 reps;30 seconds  Strap   Piriformis Stretch Both;2 reps     Knee/Hip Exercises: Aerobic   Nustep L1 x2 minutes     Knee/Hip Exercises: Standing   Hip Abduction Stengthening;Both;1 set;10 reps  Pain in Lt leg at end   Hip Extension Stengthening;Both;1 set   Forward Step Up Both;1 set;10 reps;Hand Hold: 2;Step Height: 6"     Knee/Hip Exercises: Seated   Hamstring Curl Strengthening;1 set;Both;10 reps  red     Moist Heat Therapy   Number Minutes Moist Heat 15 Minutes   Moist Heat Location Lumbar Spine     Electrical Stimulation   Electrical Stimulation Location bilateral lumbar   Electrical Stimulation Action IFC   Electrical Stimulation Parameters To tolerance 15 min  Sidelying   Electrical Stimulation Goals Pain                  PT Short Term Goals - 06/23/16 1135      PT SHORT  TERM GOAL #1   Title Patient will be report a 25% improvement in pain with usual ADLS  81/10/31   Time 4   Period Weeks   Status On-going     PT SHORT TERM GOAL #2   Title The patient will be able to rise from the chair and walk short distance (timed up and go test) 13 sec or less   Time 4   Period Weeks   Status Partially Met     PT SHORT TERM GOAL #3   Title Improved right HS length to 65 degrees and hip extension to 10 degrees needed for normal stride length to walk 1/2 mile   Time 4   Period Weeks   Status On-going           PT Long Term Goals - 06/21/16 2118      PT LONG TERM GOAL #1   Title independent with HEP needed for further improvements in ROM and strength right hip   07/21/16   Period Weeks   Status On-going     PT LONG TERM GOAL #2   Title The patient will report a > 50% improvement in pain with rising from a chair   Time 8   Period Weeks   Status On-going     PT LONG TERM GOAL #3   Title The patient  will have improved right hip abduction and hip flexion strength to 4/5 needed for ascending steps to laundry with greater ease   Time 8   Period Weeks   Status On-going     PT LONG TERM GOAL #4   Title FOTO functional outcome score improved from 50% limitation to 31% indicating improved function with less pain   Time 8   Period Weeks   Status On-going               Plan - 06/23/16 1133    Clinical Impression Statement Pt having low back soreness from dry needling today. Reports having most back pain with turning from side to side while sleeping. Pt reports being able to stand and walk well but increased pan with prolonged sit and laying down. Pt has very limited hip extension and increased pain with hip extension. Pt will continue to benefit from skilled thearpy for core strength and stability.    Rehab Potential Good   Clinical Impairments Affecting Rehab Potential RA, osteopenia   PT Frequency 2x / week   PT Duration 8 weeks   PT Treatment/Interventions ADLs/Self Care Home Management;Cryotherapy;Electrical Stimulation;Iontophoresis 23m/ml Dexamethasone;Moist Heat;Ultrasound;Patient/family education;Therapeutic exercise;Manual techniques;Taping;Dry needling   PT Next Visit Plan Continue to strengthen core and Bil hips      Patient will benefit from skilled therapeutic intervention in order to improve the following deficits and impairments:  Decreased activity tolerance, Decreased range of motion, Decreased strength, Increased fascial restricitons, Increased muscle spasms, Pain, Impaired flexibility  Visit Diagnosis: Pain in right hip  Muscle weakness (generalized)  Stiffness of right hip, not elsewhere classified  Radiculopathy, lumbar region     Problem List There are no active problems to display for this patient.   KMikle BosworthPTA 06/23/2016, 11:37 AM  [facility] [address] [place] [address]Phone: [phone]  Fax:  [phone] Name: Christina CowmanMRN: [id number] of Birth: 7July 05, 1937

## 2016-06-28 ENCOUNTER — Encounter: Payer: Self-pay | Admitting: Physical Therapy

## 2016-06-28 ENCOUNTER — Ambulatory Visit: Payer: Medicare Other | Admitting: Physical Therapy

## 2016-06-28 DIAGNOSIS — M25651 Stiffness of right hip, not elsewhere classified: Secondary | ICD-10-CM

## 2016-06-28 DIAGNOSIS — M6281 Muscle weakness (generalized): Secondary | ICD-10-CM | POA: Diagnosis not present

## 2016-06-28 DIAGNOSIS — M25551 Pain in right hip: Secondary | ICD-10-CM | POA: Diagnosis not present

## 2016-06-28 DIAGNOSIS — M5416 Radiculopathy, lumbar region: Secondary | ICD-10-CM

## 2016-06-28 NOTE — Therapy (Signed)
[facility] [address], [place], [place], [address] Phone: [phone]   Fax:  [phone]  Physical Therapy Treatment  Patient Details  Name: Christina Henderson MRN: [id number] Date of Birth: 1935/08/07 Referring Provider: Dr. Addison Lank  Encounter Date: 06/28/2016      PT End of Session - 06/28/16 1102    Visit Number 9   Date for PT Re-Evaluation 07/21/16   Authorization Type Medicare G code: KX at visit 15   PT Start Time 1100   PT Stop Time 1155   PT Time Calculation (min) 55 min   Activity Tolerance Patient tolerated treatment well      Past Medical History:  Diagnosis Date  . Arthritis    rhuematoid arthritis  . Glaucoma   . Hypertension   . Osteoporosis    osteopenia    Past Surgical History:  Procedure Laterality Date  . APPENDECTOMY    . EYE SURGERY     cataract  . SPINE SURGERY     lumbar lamenectomy    There were no vitals filed for this visit.      Subjective Assessment - 06/28/16 1100    Subjective Pt feeling very tiered today. Had food poisoning last night and was vomitting. Did not take medicine this morning due to sickness.    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 no problem    How long can you walk comfortably? around [employer] pushing cart   Diagnostic tests none   Patient Stated Goals get rid of pain; resume normal activities   Currently in Pain? Yes   Pain Score 3    Pain Location Hip   Pain Orientation Right   Pain Descriptors / Indicators Aching;Sore   Pain Type Acute pain   Pain Onset 1 to 4 weeks ago   Pain Frequency Intermittent                         [facility] Adult PT Treatment/Exercise - 06/28/16 0001      Lumbar Exercises: Supine   Ab Set 10 reps   Bent Knee Raise 20 reps   Straight Leg  Raise 10 reps   Other Supine Lumbar Exercises Ball Squeeze     Knee/Hip Exercises: Stretches   Active Hamstring Stretch Left;3 reps;30 seconds  Strap   Piriformis Stretch Both;2 reps   Other Knee/Hip Stretches psoas doorway stretch 3x5 right and left with UE movements  performed on stairs   Other Knee/Hip Stretches Glute stretch     Knee/Hip Exercises: Aerobic   Nustep L1 x5 minutes  Therapist present to discuss treatment     Knee/Hip Exercises: Standing   Hip Flexion Stengthening;Both;1 set;10 reps  Standing marching   Hip Extension Stengthening;Both;1 set     Moist Heat Therapy   Number Minutes Moist Heat 15 Minutes   Moist Heat Location Lumbar Spine     Electrical Stimulation   Electrical Stimulation Location bilateral lumbar   Electrical Stimulation Action IFC   Electrical Stimulation Parameters To toelrance   Electrical Stimulation Goals Pain     Manual Therapy   Manual Therapy Soft tissue mobilization   Manual therapy comments Pt prone   Soft tissue mobilization To Bil gluteal, piriformis, and lumbar paraspinals  PT Education - 06/28/16 1139    Education provided Yes   Education Details hip stretches   Person(s) Educated Patient   Methods Explanation;Demonstration;Handout   Comprehension Verbalized understanding          PT Short Term Goals - 06/28/16 1102      PT SHORT TERM GOAL #1   Title Patient will be report a 25% improvement in pain with usual ADLS  43/32/95   Time 4   Period Weeks   Status On-going     PT SHORT TERM GOAL #2   Title The patient will be able to rise from the chair and walk short distance (timed up and go test) 13 sec or less   Time 4   Period Weeks   Status Partially Met     PT SHORT TERM GOAL #3   Title Improved right HS length to 65 degrees and hip extension to 10 degrees needed for normal stride length to walk 1/2 mile   Time 4   Period Weeks   Status On-going           PT Long Term Goals -  06/28/16 1102      PT LONG TERM GOAL #1   Title independent with HEP needed for further improvements in ROM and strength right hip   07/21/16   Time 8   Period Weeks   Status On-going     PT LONG TERM GOAL #2   Title The patient will report a > 50% improvement in pain with rising from a chair   Time 8   Period Weeks   Status On-going     PT LONG TERM GOAL #3   Title The patient will have improved right hip abduction and hip flexion strength to 4/5 needed for ascending steps to laundry with greater ease   Time 8   Period Weeks   Status On-going     PT LONG TERM GOAL #4   Title FOTO functional outcome score improved from 50% limitation to 31% indicating improved function with less pain   Time 8   Period Weeks   Status On-going               Plan - 06/28/16 1147    Clinical Impression Statement Pt presents with antalgic gait and hip stiffness. Pt able to tolerate all exercises well with no reported increase in pain. Pt instructed in Bil hip stretching and flexibility. Pt will continue to benefit from sklilled therapy for core and LE strengthening.    Rehab Potential Good   Clinical Impairments Affecting Rehab Potential RA, osteopenia   PT Frequency 2x / week   PT Duration 8 weeks   PT Treatment/Interventions ADLs/Self Care Home Management;Cryotherapy;Electrical Stimulation;Iontophoresis 41m/ml Dexamethasone;Moist Heat;Ultrasound;Patient/family education;Therapeutic exercise;Manual techniques;Taping;Dry needling   PT Next Visit Plan Continue to strengthen core and Bil hips   Consulted and Agree with Plan of Care Patient      Patient will benefit from skilled therapeutic intervention in order to improve the following deficits and impairments:  Decreased activity tolerance, Decreased range of motion, Decreased strength, Increased fascial restricitons, Increased muscle spasms, Pain, Impaired flexibility  Visit Diagnosis: Pain in right hip  Muscle weakness  (generalized)  Stiffness of right hip, not elsewhere classified  Radiculopathy, lumbar region     Problem List There are no active problems to display for this patient.   KMikle BosworthPTA 06/28/2016, 11:56 AM  [facility] [address] [place], [address] Phone: [phone]   Fax:  [phone]  Name: Alanna Storti MRN: [id number] Date of Birth: 09-04-1935

## 2016-06-28 NOTE — Patient Instructions (Signed)
Piriformis Stretch - Supine    Pull uninvolved knee across body toward opposite shoulder. Hold slight stretch for _10__ seconds. Repeat with involved leg. Repeat _2__ times. Do _1__ times per day.  Copyright  VHI. All rights reserved.  Low Back Stretch: One leg (Supine)    Lying on back, bring one knee toward chest by pulling gently behind knee. Hold _10___ seconds. Repeat with other leg.  Copyright  VHI. All rights reserved.   Mikle Bosworth, PTA 06/28/16 11:24 AM  [facility] [address] [place], [place] [address] Phone # [phone] Fax [phone]

## 2016-06-30 ENCOUNTER — Ambulatory Visit: Payer: Medicare Other | Admitting: Physical Therapy

## 2016-06-30 DIAGNOSIS — M6281 Muscle weakness (generalized): Secondary | ICD-10-CM

## 2016-06-30 DIAGNOSIS — M5416 Radiculopathy, lumbar region: Secondary | ICD-10-CM

## 2016-06-30 DIAGNOSIS — M25651 Stiffness of right hip, not elsewhere classified: Secondary | ICD-10-CM

## 2016-06-30 DIAGNOSIS — M25551 Pain in right hip: Secondary | ICD-10-CM

## 2016-06-30 NOTE — Therapy (Signed)
[facility] [address] [place], [place], [address] Phone: [phone]   Fax:  [phone]  Physical Therapy Treatment  Patient Details  Name: Christina Henderson MRN: [id number] Date of Birth: 10-Jun-1936 Referring Provider: Dr. Addison Lank  Encounter Date: 06/30/2016      PT End of Session - 06/30/16 1100    Visit Number 10   Date for PT Re-Evaluation 07/21/16   Authorization Type Medicare G code: KX at visit 15   PT Start Time 1015   PT Stop Time 1110   PT Time Calculation (min) 55 min   Activity Tolerance Patient tolerated treatment well      Past Medical History:  Diagnosis Date  . Arthritis    rhuematoid arthritis  . Glaucoma   . Hypertension   . Osteoporosis    osteopenia    Past Surgical History:  Procedure Laterality Date  . APPENDECTOMY    . EYE SURGERY     cataract  . SPINE SURGERY     lumbar lamenectomy    There were no vitals filed for this visit.      Subjective Assessment - 06/30/16 1016    Subjective I'm doing much better but my problem today is my hip.  Not going down the leg anymore.  Last night is the first night it didn't wake me up.  I haven't tried swimming.  Overall 50% better.  I thought lying on my stomach to do the heat last time helped.     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 Yes   Pain Score 2    Pain Orientation Right   Pain Type Acute pain            [facility] PT Assessment - 06/30/16 0001      Observation/Other Assessments   Focus on Therapeutic Outcomes (FOTO)  47% limitation      Strength   Right Hip ABduction 3+/5   Left Hip ABduction 4/5     Flexibility   Soft Tissue Assessment /Muscle Length --  HS right 85 degrees, left 90 degrees     Timed Up and Go Test   Manual TUG (seconds) 13.6                     [facility] Adult PT Treatment/Exercise -  06/30/16 0001      Lumbar Exercises: Supine   Ab Set 10 reps     Knee/Hip Exercises: Standing   Hip Abduction Stengthening;Right;Left;1 set;10 reps   Abduction Limitations red band on right but unable to do left hip abduction with band (WB on right )   Hip Extension Stengthening;Right;Left;1 set;10 reps   Extension Limitations red band     Knee/Hip Exercises: Supine   Other Supine Knee/Hip Exercises  hip abduction/external rotation 3x10 red band     Moist Heat Therapy   Number Minutes Moist Heat 15 Minutes   Moist Heat Location Lumbar Spine     Electrical Stimulation   Electrical Stimulation Location bilateral lumbar  buttocks   Electrical Stimulation Action IFC   Electrical Stimulation Parameters prone intensity to tolerance   Electrical Stimulation Goals Pain                  PT Short Term Goals - 06/30/16 1410      PT SHORT TERM GOAL #1   Title Patient will  be report a 25% improvement in pain with usual ADLS  80/32/12   Status Achieved     PT SHORT TERM GOAL #2   Title The patient will be able to rise from the chair and walk short distance (timed up and go test) 13 sec or less   Time 4   Period Weeks   Status Partially Met     PT SHORT TERM GOAL #3   Title Improved right HS length to 65 degrees and hip extension to 10 degrees needed for normal stride length to walk 1/2 mile   Status Achieved           PT Long Term Goals - Jul 15, 2016 1026      PT LONG TERM GOAL #1   Title independent with HEP needed for further improvements in ROM and strength right hip   07/21/16   Time 8   Period Weeks   Status On-going     PT LONG TERM GOAL #2   Title The patient will report a > 50% improvement in pain with rising from a chair   Status Achieved     PT LONG TERM GOAL #3   Title The patient will have improved right hip abduction and hip flexion strength to 4/5 needed for ascending steps to laundry with greater ease   Time 8   Period Weeks   Status On-going      PT LONG TERM GOAL #4   Title FOTO functional outcome score improved from 50% limitation to 31% indicating improved function with less pain   Time 8   Period Weeks   Status On-going               Plan - 07/15/16 1405    Clinical Impression Statement The patient continues to have variable pain between her back and hips but she reports an overall improvement in pain at 50%.   She has increased pain with sitting for a period of time and then rising from the chair which is an issue becuase her leisure activity is quilting.  Her FOTO functional outcome score has improved slightly.  She continues to be significantly weak in right > left gluteus medius muscles.  She has great difficulty single leg standing and has a severe pelvic drop on right and a lateral trunk lean when standing on the left.  She would benefit from continued PT to address this weakness that contributes to her pain.     PT Next Visit Plan Gluteus medius strengthening in supine, seated and standing;  modalities as needed for pain      Patient will benefit from skilled therapeutic intervention in order to improve the following deficits and impairments:     Visit Diagnosis: Pain in right hip  Muscle weakness (generalized)  Stiffness of right hip, not elsewhere classified  Radiculopathy, lumbar region       G-Codes - Jul 15, 2016 1411    Functional Assessment Tool Used FOTO; clinical judgement   Functional Limitation Mobility: Walking and moving around   Mobility: Walking and Moving Around Current Status (Y4825) At least 40 percent but less than 60 percent impaired, limited or restricted   Mobility: Walking and Moving Around Goal Status (O0370) At least 20 percent but less than 40 percent impaired, limited or restricted      Problem List There are no active problems to display for this patient. Ruben Im, PT 2016-07-15 2:13 PM Phone: [phone] Fax: [phone]  Alvera Singh 15-Jul-2016, 2:12  PM  [facility] [address], [place], [place], [address] Phone: [phone]   Fax:  [phone]  Name: Christina Henderson MRN: [id number] Date of Birth: Nov 08, 1935

## 2016-07-07 ENCOUNTER — Ambulatory Visit: Payer: Medicare Other | Admitting: Physical Therapy

## 2016-07-07 DIAGNOSIS — M5416 Radiculopathy, lumbar region: Secondary | ICD-10-CM | POA: Diagnosis not present

## 2016-07-07 DIAGNOSIS — M6281 Muscle weakness (generalized): Secondary | ICD-10-CM

## 2016-07-07 DIAGNOSIS — M25551 Pain in right hip: Secondary | ICD-10-CM

## 2016-07-07 DIAGNOSIS — M25651 Stiffness of right hip, not elsewhere classified: Secondary | ICD-10-CM | POA: Diagnosis not present

## 2016-07-07 NOTE — Therapy (Signed)
[facility] [address], [address] [place], [place], [address] Phone: [phone]   Fax:  [phone]  Physical Therapy Treatment  Patient Details  Name: Christina Henderson MRN: [id number] Date of Birth: 01/24/1936 Referring Provider: Dr. McNeill  Encounter Date: 07/07/2016      PT End of Session - 07/07/16 1135    Visit Number 11   Date for PT Re-Evaluation 07/21/16   Authorization Type Medicare G code: KX at visit 15   PT Start Time 1100   PT Stop Time 1148   PT Time Calculation (min) 48 min   Activity Tolerance Patient tolerated treatment well      Past Medical History:  Diagnosis Date  . Arthritis    rhuematoid arthritis  . Glaucoma   . Hypertension   . Osteoporosis    osteopenia    Past Surgical History:  Procedure Laterality Date  . APPENDECTOMY    . EYE SURGERY     cataract  . SPINE SURGERY     lumbar lamenectomy    There were no vitals filed for this visit.      Subjective Assessment - 07/07/16 1103    Subjective Patient requests to do her exercises in barefoot today b/c her shoes feel too loose.  I sprained my ankle but I think I'm walking better overall.  No back pain though.  Patient states she is doing better with rising and standing at the ironing board for quilting.     Currently in Pain? No/denies   Pain Score 0-No pain   Pain Location Back                         [facility] Adult PT Treatment/Exercise - 07/07/16 0001      Knee/Hip Exercises: Supine   Terminal Knee Extension Limitations whole leg press into red ball 10x right and left   Bridges with Ball Squeeze Strengthening;Both;1 set;10 reps   Other Supine Knee/Hip Exercises  hip abduction/external rotation 3x10 red band   Other Supine Knee/Hip Exercises HS sets on ball 10x     Knee/Hip Exercises: Sidelying   Clams red band 10x right and left     Knee/Hip Exercises: Prone   Hamstring Curl Limitations bent knee hip  extension 5x right/left  over 1 pillow   Hip Extension AROM;Right;Left;5 reps  over 1 pillow     Moist Heat Therapy   Number Minutes Moist Heat 15 Minutes   Moist Heat Location Lumbar Spine     Electrical Stimulation   Electrical Stimulation Location bilateral lumbar  buttocks   Electrical Stimulation Action IFC   Electrical Stimulation Parameters supine 12 ma    Electrical Stimulation Goals Pain                  PT Short Term Goals - 07/07/16 1138      PT SHORT TERM GOAL #1   Title Patient will be report a 25% improvement in pain with usual ADLS  06/23/16   Status Achieved     PT SHORT TERM GOAL #2   Title The patient will be able to rise from the chair and walk short distance (timed up and go test) 13 sec or less   Time 4   Period Weeks   Status Partially Met     PT SHORT TERM GOAL #3   Title Improved right HS length to 65 degrees and hip extension to 10 degrees needed for normal stride length   to walk 1/2 mile   Status Achieved           PT Long Term Goals - 07/07/16 1139      PT LONG TERM GOAL #1   Title independent with HEP needed for further improvements in ROM and strength right hip   07/21/16   Time 8   Period Weeks   Status On-going     PT LONG TERM GOAL #2   Title The patient will report a > 50% improvement in pain with rising from a chair   Status Achieved     PT LONG TERM GOAL #3   Title The patient will have improved right hip abduction and hip flexion strength to 4/5 needed for ascending steps to laundry with greater ease   Time 8   Period Weeks   Status On-going     PT LONG TERM GOAL #4   Title FOTO functional outcome score improved from 50% limitation to 31% indicating improved function with less pain   Time 8   Period Weeks   Status On-going               Plan - 07/07/16 1135    Clinical Impression Statement Patient eager to return to walking on the treadmill at [facility].  Discussed waiting until ankle sprain has  subsided and discussed a gradual progression starting at just 2-3 minutes initially.  Treatment was modified today secondary to ankle sprain with hip and core strengthening in non-weight bearing positions.  No complaint of back pain with exercises although patient states she worries the pain may come later and requests to do e-stim and heat for pain control.  Therapist closely monitoring response with all interventions.  Should meet remaining goals in 3-4 visits.     PT Next Visit Plan Gluteus medius strengthening in supine, seated and standing;  modalities as needed for pain      Patient will benefit from skilled therapeutic intervention in order to improve the following deficits and impairments:     Visit Diagnosis: Pain in right hip  Muscle weakness (generalized)  Stiffness of right hip, not elsewhere classified     Problem List There are no active problems to display for this patient.  Stacy Simpson, PT 07/07/16 11:40 AM Phone: [phone] Fax: [phone]  Simpson, Stacy C 07/07/2016, 11:40 AM  [facility] [address], [address] [place], [place], [address] Phone: [phone]   Fax:  [phone]  Name: Christina Henderson MRN: [id number] Date of Birth: 01/12/1936    

## 2016-07-11 DIAGNOSIS — C50919 Malignant neoplasm of unspecified site of unspecified female breast: Secondary | ICD-10-CM

## 2016-07-11 HISTORY — DX: Malignant neoplasm of unspecified site of unspecified female breast: C50.919

## 2016-07-12 ENCOUNTER — Ambulatory Visit: Payer: Medicare Other | Attending: Family Medicine | Admitting: Physical Therapy

## 2016-07-12 DIAGNOSIS — M6281 Muscle weakness (generalized): Secondary | ICD-10-CM

## 2016-07-12 DIAGNOSIS — M5416 Radiculopathy, lumbar region: Secondary | ICD-10-CM

## 2016-07-12 DIAGNOSIS — M25551 Pain in right hip: Secondary | ICD-10-CM

## 2016-07-12 DIAGNOSIS — M25651 Stiffness of right hip, not elsewhere classified: Secondary | ICD-10-CM | POA: Diagnosis not present

## 2016-07-12 NOTE — Therapy (Signed)
[facility] [address], [place], [place], [address] Phone: [phone]   Fax:  [phone]  Physical Therapy Treatment  Patient Details  Name: Christina Henderson MRN: [id number] Date of Birth: [birth year] Referring Provider: Dr. Addison Lank  Encounter Date: 07/12/2016      PT End of Session - 07/12/16 1046    Visit Number 12   Date for PT Re-Evaluation 07/21/16   Authorization Type Medicare G code: KX at visit 15   PT Start Time 1018   PT Stop Time 1056   PT Time Calculation (min) 38 min   Activity Tolerance Patient limited by pain      Past Medical History:  Diagnosis Date  . Arthritis    rhuematoid arthritis  . Glaucoma   . Hypertension   . Osteoporosis    osteopenia    Past Surgical History:  Procedure Laterality Date  . APPENDECTOMY    . EYE SURGERY     cataract  . SPINE SURGERY     lumbar lamenectomy    There were no vitals filed for this visit.      Subjective Assessment - 07/12/16 1023    Subjective I've been in a bad way after last time.  I left here hurting when I got off the heat/stim.   I don't  I've only just begun to be able to get around again.  It's been a rough few days.  I've done the standing hip kicks, clams without resistance and seated HS stretch this morning.  Ankle pain has resolved.     Currently in Pain? Yes   Pain Score 3    Pain Location Back   Pain Orientation Lower;Right;Left   Pain Type Chronic pain   Pain Frequency Intermittent                Long discussion/review of last session exercises to help identify aggravating factors.           [facility] Adult PT Treatment/Exercise - 07/12/16 0001      Knee/Hip Exercises: Standing   Hip Extension Stengthening;Right;Left;1 set;5 reps  isometric into the wall     Knee/Hip Exercises: Seated   Long Arc Quad Strengthening;Left;1 set;10 reps  discontinued right secondary to 5/10 pain     Knee/Hip Exercises: Supine   Other Supine Knee/Hip Exercises  hip abduction/external rotation 2x10 red band     Moist Heat Therapy   Number Minutes Moist Heat 15 Minutes   Moist Heat Location Lumbar Spine     Electrical Stimulation   Electrical Stimulation Location bilateral lumbar  buttocks   Electrical Stimulation Action IFC   Electrical Stimulation Parameters supine 10 ma    Electrical Stimulation Goals Pain                  PT Short Term Goals - 07/12/16 1052      PT SHORT TERM GOAL #1   Title Patient will be report a 25% improvement in pain with usual ADLS  69/62/95   Status Achieved     PT SHORT TERM GOAL #2   Title The patient will be able to rise from the chair and walk short distance (timed up and go test) 13 sec or less   Time 4   Period Weeks   Status Partially Met     PT SHORT TERM GOAL #3   Title Improved right HS length to 65 degrees and hip extension to 10 degrees needed for normal stride length to  walk 1/2 mile   Status Achieved           PT Long Term Goals - 07/12/16 1053      PT LONG TERM GOAL #1   Title independent with HEP needed for further improvements in ROM and strength right hip   07/21/16   Time 8   Period Weeks   Status On-going     PT LONG TERM GOAL #2   Title The patient will report a > 50% improvement in pain with rising from a chair   Status Achieved     PT LONG TERM GOAL #3   Title The patient will have improved right hip abduction and hip flexion strength to 4/5 needed for ascending steps to laundry with greater ease   Time 8   Period Weeks   Status On-going     PT LONG TERM GOAL #4   Title FOTO functional outcome score improved from 50% limitation to 31% indicating improved function with less pain   Time 8   Period Weeks   Status On-going               Plan - 07/12/16 1047    Clinical Impression Statement The patient continues to have variable pain in low back and hip.  She complains of a severe exacerbation of pain and stiffness  after last visit from mat exercises.  She is unable to indentify what in particular seemed to aggravate her symptoms.  Treatment modified today accordingly with the selection of only 3 excercises followed by e-stim/heat in supine (her best position of comfort) for pain control.  Overall progress has been slow secondary to her multiple exacerbations.  Therapist very closely monitoring her response with all interventions.     PT Next Visit Plan Low level Gluteus medius strengthening and core strengthening in supine, seated and standing;  modalities as needed for pain      Patient will benefit from skilled therapeutic intervention in order to improve the following deficits and impairments:     Visit Diagnosis: Pain in right hip  Muscle weakness (generalized)  Stiffness of right hip, not elsewhere classified  Radiculopathy, lumbar region     Problem List There are no active problems to display for this patient.  Ruben Im, PT 07/12/16 10:55 AM Phone: [phone] Fax: [phone]  Alvera Singh 07/12/2016, 10:54 AM  [facility] [address] [place], [place], [address] Phone: [phone]   Fax:  [phone]  Name: Yailin Biederman MRN: [id number] Date of Birth: Jun 06, 1936

## 2016-07-13 DIAGNOSIS — H16223 Keratoconjunctivitis sicca, not specified as Sjogren's, bilateral: Secondary | ICD-10-CM | POA: Diagnosis not present

## 2016-07-13 DIAGNOSIS — H401132 Primary open-angle glaucoma, bilateral, moderate stage: Secondary | ICD-10-CM | POA: Diagnosis not present

## 2016-07-13 DIAGNOSIS — Z79899 Other long term (current) drug therapy: Secondary | ICD-10-CM | POA: Diagnosis not present

## 2016-07-14 ENCOUNTER — Ambulatory Visit: Payer: Medicare Other | Admitting: Physical Therapy

## 2016-07-14 DIAGNOSIS — M25651 Stiffness of right hip, not elsewhere classified: Secondary | ICD-10-CM

## 2016-07-14 DIAGNOSIS — M6281 Muscle weakness (generalized): Secondary | ICD-10-CM | POA: Diagnosis not present

## 2016-07-14 DIAGNOSIS — M25551 Pain in right hip: Secondary | ICD-10-CM | POA: Diagnosis not present

## 2016-07-14 DIAGNOSIS — M5416 Radiculopathy, lumbar region: Secondary | ICD-10-CM | POA: Diagnosis not present

## 2016-07-14 NOTE — Therapy (Signed)
[facility] [address], [place], [place], [address] Phone: [phone]   Fax:  [phone]  Physical Therapy Treatment  Patient Details  Name: Christina Henderson MRN: [id number] Date of Birth: 11/21/1935 Referring Provider: Dr. Addison Lank  Encounter Date: 07/14/2016      PT End of Session - 07/14/16 1047    Visit Number 13   Date for PT Re-Evaluation 07/21/16   Authorization Type Medicare G code: KX at visit 15   PT Start Time 1021   PT Stop Time 1102  moist heat   PT Time Calculation (min) 41 min   Activity Tolerance Patient tolerated treatment well      Past Medical History:  Diagnosis Date  . Arthritis    rhuematoid arthritis  . Glaucoma   . Hypertension   . Osteoporosis    osteopenia    Past Surgical History:  Procedure Laterality Date  . APPENDECTOMY    . EYE SURGERY     cataract  . SPINE SURGERY     lumbar lamenectomy    There were no vitals filed for this visit.      Subjective Assessment - 07/14/16 1019    Subjective I left here in pain.  It didn't hurt until the e-stim/heat.    Yesterday was really good.  My calves are sore today.  I quilted most of the day yesterday.   Rates pain at a .5/10.  I"m standing pretty straight.     Currently in Pain? No/denies   Pain Score 1                          [facility] Adult PT Treatment/Exercise - 07/14/16 0001      Knee/Hip Exercises: Aerobic   Other Aerobic Bike 5 min Level 1     Knee/Hip Exercises: Standing   Hip ADduction Strengthening;Right;Left;1 set;5 reps   Hip ADduction Limitations --  isometric against wall  discontinued secondary to pain   Hip Extension Stengthening;Right;Left;1 set;5 reps  isometric into the wall   Other Standing Knee Exercises gluteal squeeze     Knee/Hip Exercises: Supine   Terminal Knee Extension Limitations whole leg press into red ball 10x right and left   Other Supine Knee/Hip Exercises  hip  abduction/external rotation10 xred band     Moist Heat Therapy   Number Minutes Moist Heat 10 Minutes   Moist Heat Location Lumbar Spine                  PT Short Term Goals - 07/14/16 1924      PT SHORT TERM GOAL #1   Title Patient will be report a 25% improvement in pain with usual ADLS  27/78/24   Status Achieved     PT SHORT TERM GOAL #2   Title The patient will be able to rise from the chair and walk short distance (timed up and go test) 13 sec or less   Time 4   Period Weeks   Status Partially Met     PT SHORT TERM GOAL #3   Title Improved right HS length to 65 degrees and hip extension to 10 degrees needed for normal stride length to walk 1/2 mile   Status Achieved           PT Long Term Goals - 07/14/16 1924      PT LONG TERM GOAL #1   Title independent with HEP needed for further improvements in  ROM and strength right hip   07/21/16   Time 8   Period Weeks   Status On-going     PT LONG TERM GOAL #2   Title The patient will report a > 50% improvement in pain with rising from a chair   Status Achieved     PT LONG TERM GOAL #3   Title The patient will have improved right hip abduction and hip flexion strength to 4/5 needed for ascending steps to laundry with greater ease   Time 8   Period Weeks   Status On-going     PT LONG TERM GOAL #4   Title FOTO functional outcome score improved from 50% limitation to 31% indicating improved function with less pain   Time 8   Period Weeks   Status On-going               Plan - 07/14/16 1913    Clinical Impression Statement The patient's progress with therapy has been variable and slower than anticipated.  Numerous interventions have been implemented including iontophoresis, dry needling, manual therapy, electrical stimulation, moist heat and therapeutic exercise.  Numerous modifcations made to exercises today and electrical stimulation discontinued as patient feels this made her worse.  Approaching max  potential with PT.  Will check goals and promote independence with a HEP over the next 2 visits to prepare for probable discharge.     PT Next Visit Plan Low level Gluteus medius strengthening and core strengthening in supine, seated and standing;  moist heat as needed for pain (no e-stim);  prepare for discharge from PT      Patient will benefit from skilled therapeutic intervention in order to improve the following deficits and impairments:     Visit Diagnosis: Pain in right hip  Muscle weakness (generalized)  Stiffness of right hip, not elsewhere classified     Problem List There are no active problems to display for this patient.  Ruben Im, PT 07/14/16 7:27 PM Phone: [phone] Fax: [phone]  Alvera Singh 07/14/2016, 7:26 PM  [facility] [address], [place], [place], [address] Phone: [phone]   Fax:  [phone]  Name: Christina Henderson MRN: [id number] Date of Birth: 1936-06-16

## 2016-07-18 ENCOUNTER — Ambulatory Visit: Payer: Medicare Other | Admitting: Physical Therapy

## 2016-07-18 ENCOUNTER — Encounter: Payer: Self-pay | Admitting: Physical Therapy

## 2016-07-18 DIAGNOSIS — M25651 Stiffness of right hip, not elsewhere classified: Secondary | ICD-10-CM

## 2016-07-18 DIAGNOSIS — M25551 Pain in right hip: Secondary | ICD-10-CM

## 2016-07-18 DIAGNOSIS — M5416 Radiculopathy, lumbar region: Secondary | ICD-10-CM | POA: Diagnosis not present

## 2016-07-18 DIAGNOSIS — M6281 Muscle weakness (generalized): Secondary | ICD-10-CM

## 2016-07-18 NOTE — Therapy (Signed)
[facility] [address] [place], [place], [address] Phone: [phone]   Fax:  [phone]  Physical Therapy Treatment  Patient Details  Name: Christina Henderson MRN: [id number] Date of Birth: 03/01/1936 Referring Provider: Dr. Addison Lank  Encounter Date: 07/18/2016      PT End of Session - 07/18/16 0932    Visit Number 14   Date for PT Re-Evaluation 07/21/16   Authorization Type Medicare G code: KX at visit 15   PT Start Time 0928   PT Stop Time 1006   PT Time Calculation (min) 38 min   Activity Tolerance Patient tolerated treatment well      Past Medical History:  Diagnosis Date  . Arthritis    rhuematoid arthritis  . Glaucoma   . Hypertension   . Osteoporosis    osteopenia    Past Surgical History:  Procedure Laterality Date  . APPENDECTOMY    . EYE SURGERY     cataract  . SPINE SURGERY     lumbar lamenectomy    There were no vitals filed for this visit.      Subjective Assessment - 07/18/16 0931    Subjective I feel ok today. The heat bothered me last time so no heat today. Yesterday no back pain, some back pain Saturday.    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 no problem    How long can you walk comfortably? around [employer] pushing cart   Diagnostic tests none   Patient Stated Goals get rid of pain; resume normal activities   Currently in Pain? Yes   Pain Score 1    Pain Location Back   Pain Orientation Left;Right;Lower   Pain Descriptors / Indicators Aching;Sore   Pain Type Chronic pain   Pain Onset 1 to 4 weeks ago   Pain Frequency Intermittent   Aggravating Factors  Rising from sitting   Pain Relieving Factors Lying down, medicine for RA, sitting                         [facility] Adult PT Treatment/Exercise -  07/18/16 0001      Knee/Hip Exercises: Stretches   Active Hamstring Stretch Both;1 rep;10 seconds  At stairs   Gastroc Stretch 1 rep;Both  at stairs     Knee/Hip Exercises: Aerobic   Nustep L1 x5 minutes  Therapist present to discuss treatment     Knee/Hip Exercises: Standing   Forward Step Up Both;1 set;10 reps;Hand Hold: 2;Step Height: 6"   Other Standing Knee Exercises gluteal squeeze     Knee/Hip Exercises: Supine   Terminal Knee Extension Limitations whole leg press into red ball 10x right and left   Bridges with [employer] Strengthening;2 sets;10 reps  Ball squeeze only, VC for breathing   Other Supine Knee/Hip Exercises  hip abduction/external rotation10 xred band   Other Supine Knee/Hip Exercises Supine marches x10     Manual Therapy   Manual Therapy Soft tissue mobilization   Manual therapy comments Pt Lt sidelying   Soft tissue mobilization To Rt glute near origin                  PT Short Term Goals - 07/18/16 0933      PT SHORT TERM GOAL #1   Title Patient will  be report a 25% improvement in pain with usual ADLS  94/85/46   Time 4   Period Weeks   Status Achieved     PT SHORT TERM GOAL #2   Title The patient will be able to rise from the chair and walk short distance (timed up and go test) 13 sec or less   Time 4   Period Weeks   Status Partially Met           PT Long Term Goals - 07/18/16 2703      PT LONG TERM GOAL #1   Title independent with HEP needed for further improvements in ROM and strength right hip   07/21/16   Time 8   Period Weeks   Status On-going     PT LONG TERM GOAL #2   Title The patient will report a > 50% improvement in pain with rising from a chair   Time 8   Period Weeks   Status Achieved               Plan - 07/18/16 1006    Clinical Impression Statement Pt reports feeling ok today. Had some shooting pain along Rt sacral spine down to leg upon rising after doing mat exercises. Manual soft tissue  mobilization helped to ease. Pt able to complete standing exercsies and stretching on Rt side only. Pt has decreased tolerance over all for all theraputic interventions including modalities. Pt progress has been slow and inconsistant. Pt is comliant with home exercises. Pt will continue to benefit from skiled therapy for core stability and hip ROM.    Rehab Potential Good   Clinical Impairments Affecting Rehab Potential RA, osteopenia   PT Frequency 2x / week   PT Duration 8 weeks   PT Treatment/Interventions ADLs/Self Care Home Management;Cryotherapy;Electrical Stimulation;Iontophoresis 89m/ml Dexamethasone;Moist Heat;Ultrasound;Patient/family education;Therapeutic exercise;Manual techniques;Taping;Dry needling   PT Next Visit Plan Low level Gluteus medius strengthening and core strengthening in supine, seated and standing;  moist heat as needed for pain (no e-stim);  prepare for discharge from PT   Consulted and Agree with Plan of Care Patient      Patient will benefit from skilled therapeutic intervention in order to improve the following deficits and impairments:  Decreased activity tolerance, Decreased range of motion, Decreased strength, Increased fascial restricitons, Increased muscle spasms, Pain, Impaired flexibility  Visit Diagnosis: Pain in right hip  Muscle weakness (generalized)  Stiffness of right hip, not elsewhere classified     Problem List There are no active problems to display for this patient.   KMikle BosworthPTA 07/18/2016, 10:17 AM  [facility] [address] [place] [address]Phone: [phone]  Fax:  [phone] Name: Christina BurasMRN: [id number] of Birth: 724-Nov-1937

## 2016-07-19 ENCOUNTER — Encounter: Payer: Medicare Other | Admitting: Physical Therapy

## 2016-07-19 DIAGNOSIS — M1990 Unspecified osteoarthritis, unspecified site: Secondary | ICD-10-CM | POA: Diagnosis not present

## 2016-07-19 DIAGNOSIS — M859 Disorder of bone density and structure, unspecified: Secondary | ICD-10-CM | POA: Diagnosis not present

## 2016-07-19 DIAGNOSIS — E663 Overweight: Secondary | ICD-10-CM | POA: Diagnosis not present

## 2016-07-19 DIAGNOSIS — M069 Rheumatoid arthritis, unspecified: Secondary | ICD-10-CM | POA: Diagnosis not present

## 2016-07-19 DIAGNOSIS — Z6828 Body mass index (BMI) 28.0-28.9, adult: Secondary | ICD-10-CM | POA: Diagnosis not present

## 2016-07-19 DIAGNOSIS — I10 Essential (primary) hypertension: Secondary | ICD-10-CM | POA: Diagnosis not present

## 2016-07-19 DIAGNOSIS — Z86010 Personal history of colonic polyps: Secondary | ICD-10-CM | POA: Diagnosis not present

## 2016-07-21 ENCOUNTER — Encounter: Payer: Self-pay | Admitting: Physical Therapy

## 2016-07-21 ENCOUNTER — Ambulatory Visit: Payer: Medicare Other | Admitting: Physical Therapy

## 2016-07-21 DIAGNOSIS — M5416 Radiculopathy, lumbar region: Secondary | ICD-10-CM | POA: Diagnosis not present

## 2016-07-21 DIAGNOSIS — M6281 Muscle weakness (generalized): Secondary | ICD-10-CM | POA: Diagnosis not present

## 2016-07-21 DIAGNOSIS — M25551 Pain in right hip: Secondary | ICD-10-CM

## 2016-07-21 DIAGNOSIS — M25651 Stiffness of right hip, not elsewhere classified: Secondary | ICD-10-CM | POA: Diagnosis not present

## 2016-07-21 NOTE — Therapy (Signed)
[facility] [address], [place], [place], [address] Phone: [phone]   Fax:  [phone]  Physical Therapy Treatment  Patient Details  Name: Christina Henderson MRN: [id number] Date of Birth: 05-01-1936 Referring Provider: Dr. Theadore Nan  Encounter Date: 07/21/2016      PT End of Session - 07/21/16 1022    Visit Number 15   Date for PT Re-Evaluation 07/21/16   Authorization Type Medicare G code: KX at visit 15   PT Start Time 1015   PT Stop Time 1055   PT Time Calculation (min) 40 min   Behavior During Therapy [facility] for tasks assessed/performed      Past Medical History:  Diagnosis Date  . Arthritis    rhuematoid arthritis  . Glaucoma   . Hypertension   . Osteoporosis    osteopenia    Past Surgical History:  Procedure Laterality Date  . APPENDECTOMY    . EYE SURGERY     cataract  . SPINE SURGERY     lumbar lamenectomy    There were no vitals filed for this visit.      Subjective Assessment - 07/21/16 1024    Subjective I am still the same.  I just saw my doctor.    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 no problem    How long can you walk comfortably? around [employer] pushing cart   Diagnostic tests none   Patient Stated Goals get rid of pain; resume normal activities   Currently in Pain? Yes   Pain Score 2    Pain Location Back   Pain Orientation Right   Pain Descriptors / Indicators Dull;Aching   Pain Type Chronic pain   Pain Onset 1 to 4 weeks ago   Pain Frequency Intermittent   Aggravating Factors  getting up from a chair    Pain Relieving Factors walk and stretch, sitting   Multiple Pain Sites No            [facility] PT Assessment - 07/21/16 0001      Assessment   Medical Diagnosis right groin, SI joint strain     Referring Provider Dr. Theadore Nan   Hand Dominance Right   Prior Therapy Yes here for LBP     Precautions   Precautions None     Restrictions   Weight Bearing Restrictions No     Balance Screen   Has the patient fallen in the past 6 months No   Has the patient had a decrease in activity level because of a fear of falling?  No   Is the patient reluctant to leave their home because of a fear of falling?  No     Home Ecologist residence     Prior Function   Level of Independence Independent   Vocation Retired   Leisure Health visitor   Overall Cognitive Status Within Functional Limits for tasks assessed     Observation/Other Assessments   Focus on Therapeutic Outcomes (FOTO)  47% limitation     AROM   Lumbar Flexion WFLs   Lumbar Extension WFLS   Lumbar - Right Side Bend decreased by 50%   Lumbar - Left Side Bend decreased by 25%     Strength   Right Hip  Flexion 4/5   Right Hip External Rotation  5/5   Right Hip Internal Rotation 5/5   Right Hip ABduction 4/5   Left Hip Flexion 4/5   Left Hip External Rotation 5/5   Left Hip Internal Rotation 5/5   Left Hip ABduction 5/5     Saralyn Pilar (FABER) Test   Findings Negative     Hip Scouring   Findings Negative     Ambulation/Gait   Gait Pattern Decreased stance time - right;Decreased weight shift to right;Lateral hip instability  pushes the right hip outward     Timed Up and Go Test   Manual TUG (seconds) 13                     [facility] Adult PT Treatment/Exercise - 07/21/16 0001      Ambulation/Gait   Assistive device None     Knee/Hip Exercises: Stretches   Active Hamstring Stretch Both;1 rep;10 seconds  At stairs   Gastroc Stretch 1 rep;Both  at stairs     Knee/Hip Exercises: Standing   Hip ADduction Strengthening;Right;Left;1 set;10 reps   Hip Extension Stengthening;Right;Left;1 set;10 reps   Forward Step Up Both;1 set;10 reps;Hand Hold: 2;Step Height:  6"     Knee/Hip Exercises: Supine   Bridges with Clamshell Strengthening;Both;20 reps  use yellow band   Other Supine Knee/Hip Exercises hookly with gluteal squeeze 10x                PT Education - 07/21/16 1052    Education provided Yes   Education Details supine clam; step up; gastroc stretch; hamstring stretch   Person(s) Educated Patient   Methods Explanation;Demonstration;Handout   Comprehension Returned demonstration;Verbalized understanding          PT Short Term Goals - 07/21/16 1037      PT SHORT TERM GOAL #1   Title Patient will be report a 25% improvement in pain with usual ADLS  21/11/55   Time 4   Period Weeks   Status Achieved     PT SHORT TERM GOAL #2   Title The patient will be able to rise from the chair and walk short distance (timed up and go test) 13 sec or less   Time 4   Period Weeks   Status Achieved     PT SHORT TERM GOAL #3   Title Improved right HS length to 65 degrees and hip extension to 10 degrees needed for normal stride length to walk 1/2 mile   Time 4   Period Weeks   Status Achieved           PT Long Term Goals - 07/21/16 1034      PT LONG TERM GOAL #1   Title independent with HEP needed for further improvements in ROM and strength right hip   07/21/16   Time 8   Period Weeks   Status Achieved     PT LONG TERM GOAL #2   Title The patient will report a > 50% improvement in pain with rising from a chair   Time 8   Period Weeks   Status Achieved     PT LONG TERM GOAL #3   Title The patient will have improved right hip abduction and hip flexion strength to 4/5 needed for ascending steps to laundry with greater ease   Time 8   Period Weeks   Status Achieved     PT LONG TERM GOAL #4   Title FOTO functional outcome score  improved from 50% limitation to 31% indicating improved function with less pain   Time 8   Period Weeks   Status Not Met  47% improvement               Plan - 11-Aug-2016 1022    Clinical  Impression Statement Patient did not meet her foto goal due 47% limitation instead of 31% limitation.  Patient reports she can walk with less pain.  Paitent has difficulty with laying on right side due to pain.   Patient is able to go up stairs easier but has trouble holding the laundry.  Patient has increased strenght in legs. Patient was instructed to make short walks more often. Patient is independent with her HEP.    Rehab Potential Good   Clinical Impairments Affecting Rehab Potential RA, osteopenia   PT Treatment/Interventions ADLs/Self Care Home Management;Cryotherapy;Electrical Stimulation;Iontophoresis 61m/ml Dexamethasone;Moist Heat;Ultrasound;Patient/family education;Therapeutic exercise;Manual techniques;Taping;Dry needling   PT Next Visit Plan Discharge this visit   PT Home Exercise Plan Current HEP   Recommended Other Services suggested to patient to walk with cane on left side to help her walk better   Consulted and Agree with Plan of Care Patient      Patient will benefit from skilled therapeutic intervention in order to improve the following deficits and impairments:  Decreased activity tolerance, Decreased range of motion, Decreased strength, Increased fascial restricitons, Increased muscle spasms, Pain, Impaired flexibility  Visit Diagnosis: Pain in right hip  Muscle weakness (generalized)  Stiffness of right hip, not elsewhere classified       G-Codes - 002/01/20181023    Functional Assessment Tool Used FOTO; clinical judgement and FOTO is 47% limitation   Functional Limitation Mobility: Walking and moving around   Mobility: Walking and Moving Around Goal Status [phone] At least 20 percent but less than 40 percent impaired, limited or restricted   Mobility: Walking and Moving Around Discharge Status [phone] At least 40 percent but less than 60 percent impaired, limited or restricted      Problem List There are no active problems to display for this  patient.   CEarlie Counts PT 002/01/201810:59 AM   [facility] [address] [place] [id number]: [phone]  Fax:  [phone] Name: JLovene MaretMRN: [id number] of Birth: 7Aug 25, 1937 PHYSICAL THERAPY DISCHARGE SUMMARY  Visits from Start of Care: 15  Current functional level related to goals / functional outcomes: See above.    Remaining deficits: See above   Education / Equipment: HEP Suggested to patient to use a cane for walking to reduce pain and walk shorter distances more times.  Plan: Patient agrees to discharge.  Patient goals were met. Patient is being discharged due to meeting the stated rehab goals.  Thank you for the referral. CEarlie Counts PT 02018-08-109:00 AM  ?????

## 2016-07-21 NOTE — Patient Instructions (Addendum)
Gastroc Stretch    Stand with right foot back, leg straight, forward leg bent. Keeping heel on floor, turned slightly out, lean into wall until stretch is felt in calf. Hold __30__ seconds. Repeat __2__ times per set. Do _1___ sets per session. Do __1__ sessions per day.  http://orth.exer.us/26   Copyright  VHI. All rights reserved.  Step-Down / Step-Up    Stand on stair step or ____ inch stool. Slowly bend left leg, lowering other foot to floor. Return by straightening front leg. Repeat ____ times per set. Do ____ sets per session. Do ____ sessions per day.  http://orth.exer.us/684   Copyright  VHI. All rights reserved.  Step-Down / Step-Up    Stand on stair step or _6___ inch stool. Slowly bend left leg, lowering other foot to floor. Return by straightening front leg. Repeat __10__ times per set. Do __1__ sets per session. Do __1__ sessions per day.  http://orth.exer.us/684   Copyright  VHI. All rights reserved.    Lie down on your back with your knees bent. Place an elastic band around your knees and then draw your knees apart. 10x 1 time per day [facility] [address], [place], [place] [address] Phone # [phone] Fax [phone]

## 2016-08-17 DIAGNOSIS — M545 Low back pain: Secondary | ICD-10-CM | POA: Diagnosis not present

## 2016-09-06 ENCOUNTER — Other Ambulatory Visit: Payer: Self-pay | Admitting: Family Medicine

## 2016-09-06 DIAGNOSIS — M545 Low back pain: Secondary | ICD-10-CM

## 2016-09-11 ENCOUNTER — Ambulatory Visit
Admission: RE | Admit: 2016-09-11 | Discharge: 2016-09-11 | Disposition: A | Discharge status: Home / Self Care | Payer: Medicare Other | Source: Ambulatory Visit | Attending: Family Medicine | Admitting: Family Medicine

## 2016-09-11 DIAGNOSIS — M48061 Spinal stenosis, lumbar region without neurogenic claudication: Secondary | ICD-10-CM | POA: Diagnosis not present

## 2016-09-11 DIAGNOSIS — M545 Low back pain: Secondary | ICD-10-CM

## 2016-09-11 MED ORDER — GADOBENATE DIMEGLUMINE 529 MG/ML IV SOLN
14.0000 mL | Freq: Once | INTRAVENOUS | Status: AC | PRN
  Administered 2016-09-11: 14 mL via INTRAVENOUS

## 2016-09-22 DIAGNOSIS — S60948A Unspecified superficial injury of other finger, initial encounter: Secondary | ICD-10-CM | POA: Diagnosis not present

## 2016-09-23 DIAGNOSIS — M5136 Other intervertebral disc degeneration, lumbar region: Secondary | ICD-10-CM | POA: Diagnosis not present

## 2016-09-23 DIAGNOSIS — M4155 Other secondary scoliosis, thoracolumbar region: Secondary | ICD-10-CM | POA: Diagnosis not present

## 2016-09-23 DIAGNOSIS — M549 Dorsalgia, unspecified: Secondary | ICD-10-CM | POA: Diagnosis not present

## 2016-09-23 DIAGNOSIS — M4726 Other spondylosis with radiculopathy, lumbar region: Secondary | ICD-10-CM | POA: Diagnosis not present

## 2016-09-23 DIAGNOSIS — I10 Essential (primary) hypertension: Secondary | ICD-10-CM | POA: Diagnosis not present

## 2016-09-23 DIAGNOSIS — Z6828 Body mass index (BMI) 28.0-28.9, adult: Secondary | ICD-10-CM | POA: Diagnosis not present

## 2016-09-23 DIAGNOSIS — M48062 Spinal stenosis, lumbar region with neurogenic claudication: Secondary | ICD-10-CM | POA: Diagnosis not present

## 2016-10-12 DIAGNOSIS — Z6828 Body mass index (BMI) 28.0-28.9, adult: Secondary | ICD-10-CM | POA: Diagnosis not present

## 2016-10-12 DIAGNOSIS — G629 Polyneuropathy, unspecified: Secondary | ICD-10-CM | POA: Diagnosis not present

## 2016-10-12 DIAGNOSIS — M2550 Pain in unspecified joint: Secondary | ICD-10-CM | POA: Diagnosis not present

## 2016-10-12 DIAGNOSIS — Z79899 Other long term (current) drug therapy: Secondary | ICD-10-CM | POA: Diagnosis not present

## 2016-10-12 DIAGNOSIS — M0579 Rheumatoid arthritis with rheumatoid factor of multiple sites without organ or systems involvement: Secondary | ICD-10-CM | POA: Diagnosis not present

## 2016-10-12 DIAGNOSIS — M545 Low back pain: Secondary | ICD-10-CM | POA: Diagnosis not present

## 2016-10-12 DIAGNOSIS — E663 Overweight: Secondary | ICD-10-CM | POA: Diagnosis not present

## 2016-10-12 DIAGNOSIS — M150 Primary generalized (osteo)arthritis: Secondary | ICD-10-CM | POA: Diagnosis not present

## 2016-10-13 DIAGNOSIS — M4726 Other spondylosis with radiculopathy, lumbar region: Secondary | ICD-10-CM | POA: Diagnosis not present

## 2016-10-13 DIAGNOSIS — M5116 Intervertebral disc disorders with radiculopathy, lumbar region: Secondary | ICD-10-CM | POA: Diagnosis not present

## 2016-10-13 DIAGNOSIS — M48061 Spinal stenosis, lumbar region without neurogenic claudication: Secondary | ICD-10-CM | POA: Diagnosis not present

## 2016-10-13 DIAGNOSIS — M5136 Other intervertebral disc degeneration, lumbar region: Secondary | ICD-10-CM | POA: Diagnosis not present

## 2017-02-02 DIAGNOSIS — Z79899 Other long term (current) drug therapy: Secondary | ICD-10-CM | POA: Diagnosis not present

## 2017-02-02 DIAGNOSIS — H401132 Primary open-angle glaucoma, bilateral, moderate stage: Secondary | ICD-10-CM | POA: Diagnosis not present

## 2017-02-21 DIAGNOSIS — I10 Essential (primary) hypertension: Secondary | ICD-10-CM | POA: Diagnosis not present

## 2017-02-21 DIAGNOSIS — M859 Disorder of bone density and structure, unspecified: Secondary | ICD-10-CM | POA: Diagnosis not present

## 2017-02-21 DIAGNOSIS — E7800 Pure hypercholesterolemia, unspecified: Secondary | ICD-10-CM | POA: Diagnosis not present

## 2017-02-23 DIAGNOSIS — M25562 Pain in left knee: Secondary | ICD-10-CM | POA: Diagnosis not present

## 2017-02-23 DIAGNOSIS — H409 Unspecified glaucoma: Secondary | ICD-10-CM | POA: Diagnosis not present

## 2017-02-23 DIAGNOSIS — I10 Essential (primary) hypertension: Secondary | ICD-10-CM | POA: Diagnosis not present

## 2017-02-23 DIAGNOSIS — K219 Gastro-esophageal reflux disease without esophagitis: Secondary | ICD-10-CM | POA: Diagnosis not present

## 2017-02-23 DIAGNOSIS — Z0000 Encounter for general adult medical examination without abnormal findings: Secondary | ICD-10-CM | POA: Diagnosis not present

## 2017-02-23 DIAGNOSIS — Z1231 Encounter for screening mammogram for malignant neoplasm of breast: Secondary | ICD-10-CM | POA: Diagnosis not present

## 2017-02-23 DIAGNOSIS — Z1389 Encounter for screening for other disorder: Secondary | ICD-10-CM | POA: Diagnosis not present

## 2017-02-23 DIAGNOSIS — M069 Rheumatoid arthritis, unspecified: Secondary | ICD-10-CM | POA: Diagnosis not present

## 2017-02-23 DIAGNOSIS — M85852 Other specified disorders of bone density and structure, left thigh: Secondary | ICD-10-CM | POA: Diagnosis not present

## 2017-02-23 DIAGNOSIS — E663 Overweight: Secondary | ICD-10-CM | POA: Diagnosis not present

## 2017-02-23 DIAGNOSIS — E785 Hyperlipidemia, unspecified: Secondary | ICD-10-CM | POA: Diagnosis not present

## 2017-02-23 DIAGNOSIS — R05 Cough: Secondary | ICD-10-CM | POA: Diagnosis not present

## 2017-03-01 ENCOUNTER — Other Ambulatory Visit: Payer: Self-pay | Admitting: Family Medicine

## 2017-03-01 DIAGNOSIS — Z1231 Encounter for screening mammogram for malignant neoplasm of breast: Secondary | ICD-10-CM

## 2017-03-11 DIAGNOSIS — C801 Malignant (primary) neoplasm, unspecified: Secondary | ICD-10-CM

## 2017-03-11 HISTORY — DX: Malignant (primary) neoplasm, unspecified: C80.1

## 2017-03-24 ENCOUNTER — Inpatient Hospital Stay: Admission: RE | Admit: 2017-03-24 | Payer: Medicare Other | Source: Ambulatory Visit

## 2017-03-24 ENCOUNTER — Ambulatory Visit: Payer: Medicare Other

## 2017-03-24 ENCOUNTER — Ambulatory Visit
Admission: RE | Admit: 2017-03-24 | Discharge: 2017-03-24 | Disposition: A | Discharge status: Home / Self Care | Payer: Medicare Other | Source: Ambulatory Visit | Attending: Family Medicine | Admitting: Family Medicine

## 2017-03-24 DIAGNOSIS — Z1231 Encounter for screening mammogram for malignant neoplasm of breast: Secondary | ICD-10-CM

## 2017-03-27 ENCOUNTER — Other Ambulatory Visit: Payer: Self-pay | Admitting: Family Medicine

## 2017-03-27 DIAGNOSIS — R928 Other abnormal and inconclusive findings on diagnostic imaging of breast: Secondary | ICD-10-CM

## 2017-04-05 DIAGNOSIS — M8588 Other specified disorders of bone density and structure, other site: Secondary | ICD-10-CM | POA: Diagnosis not present

## 2017-04-10 HISTORY — PX: BREAST LUMPECTOMY: SHX2

## 2017-04-12 ENCOUNTER — Other Ambulatory Visit: Payer: Medicare Other

## 2017-04-12 ENCOUNTER — Ambulatory Visit
Admission: RE | Admit: 2017-04-12 | Discharge: 2017-04-12 | Disposition: A | Discharge status: Home / Self Care | Payer: Medicare Other | Source: Ambulatory Visit | Attending: Family Medicine | Admitting: Family Medicine

## 2017-04-12 ENCOUNTER — Other Ambulatory Visit: Payer: Self-pay | Admitting: Family Medicine

## 2017-04-12 DIAGNOSIS — R928 Other abnormal and inconclusive findings on diagnostic imaging of breast: Secondary | ICD-10-CM

## 2017-04-12 DIAGNOSIS — N6310 Unspecified lump in the right breast, unspecified quadrant: Secondary | ICD-10-CM

## 2017-04-12 DIAGNOSIS — N6320 Unspecified lump in the left breast, unspecified quadrant: Secondary | ICD-10-CM

## 2017-04-12 DIAGNOSIS — N6489 Other specified disorders of breast: Secondary | ICD-10-CM | POA: Diagnosis not present

## 2017-04-13 DIAGNOSIS — M150 Primary generalized (osteo)arthritis: Secondary | ICD-10-CM | POA: Diagnosis not present

## 2017-04-13 DIAGNOSIS — Z6828 Body mass index (BMI) 28.0-28.9, adult: Secondary | ICD-10-CM | POA: Diagnosis not present

## 2017-04-13 DIAGNOSIS — E663 Overweight: Secondary | ICD-10-CM | POA: Diagnosis not present

## 2017-04-13 DIAGNOSIS — M0579 Rheumatoid arthritis with rheumatoid factor of multiple sites without organ or systems involvement: Secondary | ICD-10-CM | POA: Diagnosis not present

## 2017-04-13 DIAGNOSIS — M2550 Pain in unspecified joint: Secondary | ICD-10-CM | POA: Diagnosis not present

## 2017-04-13 DIAGNOSIS — Z79899 Other long term (current) drug therapy: Secondary | ICD-10-CM | POA: Diagnosis not present

## 2017-04-14 ENCOUNTER — Ambulatory Visit
Admission: RE | Admit: 2017-04-14 | Discharge: 2017-04-14 | Disposition: A | Discharge status: Home / Self Care | Payer: Medicare Other | Source: Ambulatory Visit | Attending: Family Medicine | Admitting: Family Medicine

## 2017-04-14 ENCOUNTER — Other Ambulatory Visit: Payer: Self-pay | Admitting: Family Medicine

## 2017-04-14 DIAGNOSIS — N6310 Unspecified lump in the right breast, unspecified quadrant: Secondary | ICD-10-CM

## 2017-04-14 DIAGNOSIS — R928 Other abnormal and inconclusive findings on diagnostic imaging of breast: Secondary | ICD-10-CM

## 2017-04-14 DIAGNOSIS — N6320 Unspecified lump in the left breast, unspecified quadrant: Secondary | ICD-10-CM

## 2017-04-14 DIAGNOSIS — C50811 Malignant neoplasm of overlapping sites of right female breast: Secondary | ICD-10-CM | POA: Diagnosis not present

## 2017-04-14 DIAGNOSIS — N6012 Diffuse cystic mastopathy of left breast: Secondary | ICD-10-CM | POA: Diagnosis not present

## 2017-04-14 DIAGNOSIS — N6311 Unspecified lump in the right breast, upper outer quadrant: Secondary | ICD-10-CM | POA: Diagnosis not present

## 2017-04-14 DIAGNOSIS — N6321 Unspecified lump in the left breast, upper outer quadrant: Secondary | ICD-10-CM | POA: Diagnosis not present

## 2017-04-17 DIAGNOSIS — M238X2 Other internal derangements of left knee: Secondary | ICD-10-CM | POA: Diagnosis not present

## 2017-04-17 DIAGNOSIS — M11262 Other chondrocalcinosis, left knee: Secondary | ICD-10-CM | POA: Diagnosis not present

## 2017-04-20 ENCOUNTER — Ambulatory Visit: Payer: Self-pay | Admitting: Surgery

## 2017-04-20 ENCOUNTER — Other Ambulatory Visit: Payer: Self-pay | Admitting: Surgery

## 2017-04-20 DIAGNOSIS — Z170 Estrogen receptor positive status [ER+]: Principal | ICD-10-CM

## 2017-04-20 DIAGNOSIS — D242 Benign neoplasm of left breast: Secondary | ICD-10-CM

## 2017-04-20 DIAGNOSIS — C50911 Malignant neoplasm of unspecified site of right female breast: Secondary | ICD-10-CM

## 2017-04-20 DIAGNOSIS — D241 Benign neoplasm of right breast: Secondary | ICD-10-CM

## 2017-04-20 DIAGNOSIS — C50411 Malignant neoplasm of upper-outer quadrant of right female breast: Secondary | ICD-10-CM | POA: Diagnosis not present

## 2017-04-20 DIAGNOSIS — D249 Benign neoplasm of unspecified breast: Secondary | ICD-10-CM | POA: Diagnosis not present

## 2017-04-20 NOTE — H&P (Signed)
Christina Henderson 04/20/2017 2:31 PM Location: [facility] Patient #: [id number] DOB: [birth year] Widowed / Language: Cleophus Molt / Race: White Female  History of Present Illness Marcello Moores A. Kerri Kovacik MD; 04/20/2017 3:06 PM) Patient words: Patient sent at the request of Dr Jeanmarie Plant for mammographically detected abnormalities in both breasts. On the right she had one similar mass in the upper outer quadrant core biopsy proven to be consistent with invasive ductal carcinoma ER positive PR positive HER-2/neu negative. In the left breast in the upper outer quadrant was in and around a core biopsy-proven to be a papilloma. Patient denies history of breast pain, nipple discharge or other abnormality with the breast.          ADDENDUM REPORT: 04/20/2017 10:48 ADDENDUM: Additional 2-D and 3-D images are performed of the left breast. These confirm presence of a discrete nodule with indistinct margins in the upper-outer quadrant of the left breast. Electronically Signed By: Nolon Nations M.D. On: 04/20/2017 10:48 Addended by Nolon Nations, MD on 04/20/2017 10:51 AM  Study Result CLINICAL DATA: Patient returns after screening study for evaluation of possible distortion /mass in the right breast and possible masses in the left breast. EXAM: 2D DIGITAL DIAGNOSTIC BILATERAL MAMMOGRAM WITH CAD AND ADJUNCT TOMO ULTRASOUND BILATERAL BREAST COMPARISON: 03/24/2017, 07/02/2008 ACR Breast Density Category b: There are scattered areas of fibroglandular density. FINDINGS: Additional 2-D and 3-D images are performed. In the right breast, there is a spiculated mass in the upper central portion of the breast, further evaluated with ultrasound. Multiple additional spot compression images are performed of the left breast, confirming presence of a discrete nodule with indistinct margins in the upper-outer quadrant of the left breast. There is mild nodularity in the anterior central portion  of the left breast without discrete suspicious mass or distortion. Mammographic images were processed with CAD. On physical exam, I palpate no abnormality in the upper central portion of the right breast. I palpate no abnormality in the upper-outer quadrant of the left breast. Targeted ultrasound is performed, showing an irregular taller than wide mass with posterior acoustic shadowing in the 12 o'clock location of the right breast 5 cm from the nipple measuring 0.8 x 1.1 x 0.5 cm. There is associated internal blood flow. Evaluation of the right axilla is negative for adenopathy. Evaluation of the left breast demonstrates an irregular hypoechoic parallel mass with posterior acoustic enhancement in the 1 o'clock location 5 cm from the nipple which measures 0.6 x 0.5 x 0.3 cm. There is no associated internal blood flow. Evaluation the anterior portion of the breast demonstrates numerous small fibrocystic changes, none suspicious for malignancy. Evaluation of the left axilla is negative for adenopathy. IMPRESSION: 1. Suspicious mass in the 12 o'clock location of the right breast 5 cm from the nipple warranting tissue diagnosis. No evidence for right axillary adenopathy. 2. Indeterminate hypoechoic mass in the 1 o'clock location of the left breast 5 cm from the nipple warranting tissue diagnosis. No evidence for left axillary adenopathy. RECOMMENDATION: 1. Ultrasound-guided core biopsy of mass in the right breast 12 o'clock 5 cm from the nipple. 2. Ultrasound-guided core biopsy of left breast 1 o'clock 5 cm from the nipple. I have discussed the findings and recommendations with the patient. Results were also provided in writing at the conclusion of the visit. If applicable, a reminder letter will be sent to the patient regarding the next appointment. BI-RADS CATEGORY 4: Suspicious. Electronically Signed: By: Nolon Nations M.D. On: 04/12/2017 16:37  Result History  MM  DIAG BREAST TOMO  BILATERAL (Order #[id number]) on 04/20/2017 - Order Result History Report - Result Edited <[email or web address]>  US BREAST LTD UNI LEFT INC AXILLA (Order [id number]) - Reflex for Order [id number] Addendum ADDENDUM REPORT: 04/20/2017 10:48 ADDENDUM: Additional 2-D and 3-D images are performed of the left breast. These confirm presence of a discrete nodule with indistinct margins in the upper-outer quadrant of the left breast. Electronically Signed By: Nolon Nations M.D. On: 04/20/2017 10:48 Addended by Nolon Nations, MD on 04/20/2017 10:51 AM  Study Result CLINICAL DATA: Patient returns after screening study for evaluation of possible distortion /mass in the right breast and possible masses in the left breast. EXAM: 2D DIGITAL DIAGNOSTIC BILATERAL MAMMOGRAM WITH CAD AND ADJUNCT TOMO ULTRASOUND BILATERAL BREAST COMPARISON: 03/24/2017, 07/02/2008 ACR Breast Density Category b: There are scattered areas of fibroglandular density. FINDINGS: Additional 2-D and 3-D images are performed. In the right breast, there is a spiculated mass in the upper central portion of the breast, further evaluated with ultrasound. Multiple additional spot compression images are performed of the left breast, confirming presence of a discrete nodule with indistinct margins in the upper-outer quadrant of the left breast. There is mild nodularity in the anterior central portion of the left breast without discrete suspicious mass or distortion. Mammographic images were processed with CAD. On physical exam, I palpate no abnormality in the upper central portion of the right breast. I palpate no abnormality in the upper-outer quadrant of the left breast. Targeted ultrasound is performed, showing an irregular taller than wide mass with posterior acoustic shadowing in the 12 o'clock location of the right breast 5 cm from the nipple measuring 0.8 x 1.1 x 0.5 cm. There is associated internal blood flow.  Evaluation of the right axilla is negative for adenopathy. Evaluation of the left breast demonstrates an irregular hypoechoic parallel mass with posterior acoustic enhancement in the 1 o'clock location 5 cm from the nipple which measures 0.6 x 0.5 x 0.3 cm. There is no associated internal blood flow. Evaluation the anterior portion of the breast demonstrates numerous small fibrocystic changes, none suspicious for malignancy. Evaluation of the left axilla is negative for adenopathy. IMPRESSION: 1. Suspicious mass in the 12 o'clock location of the right breast 5 cm from the nipple warranting tissue diagnosis. No evidence for right axillary adenopathy. 2. Indeterminate hypoechoic mass in the 1 o'clock location of the left breast 5 cm from the nipple warranting tissue diagnosis. No evidence for left axillary adenopathy. RECOMMENDATION: 1. Ultrasound-guided core biopsy of mass in the right breast 12 o'clock 5 cm from the nipple. 2. Ultrasound-guided core biopsy of left breast 1 o'clock 5 cm from the nipple. I have discussed the findings and recommendations with the patient. Results were also provided in writing at the conclusion of the visit. If applicable, a reminder letter will be sent to the patient regarding the next appointment. BI-RADS CATEGORY 4: Suspicious. Electronically Signed: By: Nolon Nations M.D. On: 04/12/2017 16:37                 ADDITIONAL INFORMATION: 1. FLUORESCENCE IN-SITU HYBRIDIZATION Results: HER2 - NEGATIVE RATIO OF HER2/CEP17 SIGNALS 1.26 AVERAGE HER2 COPY NUMBER PER CELL 1.95 Reference Range: NEGATIVE HER2/CEP17 Ratio <2.0 and average HER2 copy number <4.0 EQUIVOCAL HER2/CEP17 Ratio <2.0 and average HER2 copy number >=4.0 and <6.0 POSITIVE HER2/CEP17 Ratio >=2.0 or <2.0 and average HER2 copy number >=6.0 Thressa Sheller MD Pathologist, Electronic Signature ( Signed 04/20/2017) 1. PROGNOSTIC INDICATORS Results: IMMUNOHISTOCHEMICAL AND MORPHOMETRIC  ANALYSIS PERFORMED MANUALLY Estrogen  Receptor: 100%, POSITIVE, STRONG STAINING INTENSITY Progesterone Receptor: 100%, POSITIVE, STRONG STAINING INTENSITY Proliferation Marker Ki67: 1% REFERENCE RANGE ESTROGEN RECEPTOR NEGATIVE 0% POSITIVE =>1% REFERENCE RANGE PROGESTERONE RECEPTOR NEGATIVE 0% POSITIVE =>1% 1 of 3 FINAL for Christina Henderson, Christina Henderson A [phone]) ADDITIONAL INFORMATION:(continued) All controls stained appropriately Thressa Sheller MD Pathologist, Electronic Signature ( Signed 04/18/2017) FINAL DIAGNOSIS Diagnosis 1. Breast, right, needle core biopsy, 12:00 o'clock, 5cmfn - INVASIVE DUCTAL CARCINOMA - DUCTAL CARCINOMA IN SITU - SEE COMMENT 2. Breast, left, needle core biopsy, 1:00 o'clock, 5cmfn - PAPILLARY LESION - USUAL DUCTAL HYPERPLASIA - FIBROCYSTIC CHANGES INCLUDING APOCRINE METAPLASIA - SEE COMMENT Microscopic Comment 1. Based on the biopsy, the carcinoma appears Nottingham grade 1 of 3 and measures 1.0 cm in greatest linear extent. Prognostic markers (ER/PR/ki-67/HER2-FISH) are pending and will be reported in an addendum. Dr. Saralyn Pilar has reviewed the case and agrees with above diagnosis. These results were called to [facility] on April 17, 2017. 2. The papillary lesion has focal architectural atypia but is overall favored to an atypical papilloma. Thressa Sheller MD Pathologist, Electronic Signature (Case signed 04/17/2017) Specimen Gross and Clinical Information Specimen Comment 1. TIF: 12:00 PM; extracted < 1 min; bilateral masses 2. TIF: 12:20 PM; extracted < 1 min; bilateral masses Specimen(s) Obtained: 1. Breast, right, needle core biopsy, 12:00 o'clock, 5cmfn 2. Breast, left, needle core biopsy, 1:00 o'clock, 5cmfn Specimen Clinical Information 1. Concern for invasive cancer 2. Cancer vs FCC 2 of 3 FINAL for Christina Henderson, Christina Henderson A [phone]) Gross 1. Received labeled "Bougie,Marleah" and "Right breast 12:00" (TIF 1200 CIT <34mn) are 4  cores of yellow to gray white soft to firm tissue, ranging from 1 x 0.15 x 0.15 cm to 1.5 x 0.15 x 0.15 cm. One block submitted. (SSW 10/5) 2. Received labeled "Romero,Traci" and "Left breast 1:00" (TIF 1220 CIT <132m) are 3 cores of yellow to gray white soft to firm tissue, ranging from 1.1 x 0.15 x 0.15 cm to 1.3 x 0.2 x 0.15 cm. One block submitted. (SSW 10/5) Stain(s) used in Diagnosis: The following stain(s) were used in diagnosing the case: Her2 FISH. The control(s) stained appropriately. Disclaimer Estrogen receptor (6F11), immunohistochemical stains are performed on formalin fixed, paraffin embedded tissue using a 3,3"-diaminobenzidine (DAB) chromogen and Leica Bond Autostainer System. The staining intensity of the nucleus is scored manually and is reported as the percentage of tumor cell nuclei demonstrating specific nuclear staining.Specimens are fixed in 10% Neutral Buffered Formalin for at least 6 hours and up to 72 hours. These tests have not be validated on decalcified tissue. Results should be interpreted with caution given the possibility of false negative results on decalcified specimens. HER2 IQFISH pharmDX (code [id number] a direct fluorescence in-situ hybridization assay designed to quantitatively determine HER2 gene amplification in formalin-fixed, paraffin-embedded tissue specimens. [facility] is reported using ASCO/CAP scoring criteria published in 2013. Ki-67 (MM1), immunohistochemical stains are performed on formalin fixed, paraffin embedded tissue using a 3,3"-diaminobenzidine (DAB) chromogen and Leica Bond Autostainer System. The staining intensity of the nucleus is scored manually and is reported as the percentage of tumor cell nuclei demonstrating specific nuclear staining.Specimens are fixed in 10% Neutral Buffered Formalin for at least 6 hours and up to 72 hours. These tests have not be validated on decalcified tissue. Results should be  interpreted with caution given the possibility of false negative results on decalcified specimens. PR progesterone receptor (16), immunohistochemical stains are performed on formalin fixed, paraffin embedded tissue using  a 3,3"-diaminobenzidine (DAB) chromogen and Leica Bond Autostainer System. The staining intensity of the nucleus is scored manually and is reported as the percentage of tumor cell nuclei demonstrating specific nuclear staining.Specimens are fixed in 10% Neutral Buffered Formalin for at least 6 hours and up to 72 hours. These tests have not be validated on decalcified tissue. Results should be interpreted with caution given the possibility of false negative results on decalcified specimens. Report signed out from the following location(s) Technical Component was performed at [facility]. [address],[place] [address].[id number]., Technical component and interpretation was performed at [address], [place], [place] [address]. CLIA #: [id number], 3 of.  The patient is a 81 year old female.   Past Surgical History (Tanisha A. Owens Shark, [place]; 04/20/2017 2:31 PM) Appendectomy Cataract Surgery Bilateral. Tonsillectomy  Diagnostic Studies History (Tanisha A. Owens Shark, [place]; 04/20/2017 2:31 PM) Colonoscopy 1-5 years ago Mammogram within last year Pap Smear >5 years ago  Allergies (Tanisha A. Owens Shark, [place]; 04/20/2017 2:33 PM) Sulfa Antibiotics Statins Allergies Reconciled  Medication History (Tanisha A. Owens Shark, [place]; 04/20/2017 2:34 PM) Losartan Potassium (100MG Tablet, Oral) Active. Hydroxychloroquine Sulfate (200MG Tablet, Oral) Active. Dilt-XR (180MG Capsule ER 24HR, Oral) Active. Probiotic (Oral) Active. Medications Reconciled  Social History (Tanisha A. Owens Shark, [place]; 04/20/2017 2:31 PM) Alcohol use Occasional alcohol use. No caffeine use No drug use Tobacco use Former  smoker.  Family History (Tanisha A. Owens Shark, [place]; 04/20/2017 2:31 PM) Anesthetic complications Brother. Arthritis Mother. Hypertension Mother. Thyroid problems Father.  Pregnancy / Birth History (Tanisha A. Owens Shark, [place]; 04/20/2017 2:31 PM) Age at menarche 3 years. Age of menopause 46-50 Contraceptive History Oral contraceptives. Gravida 2 Length (months) of breastfeeding 3-6 Maternal age 26-25 Para 2  Other Problems (Tanisha A. Owens Shark, [place]; 04/20/2017 2:31 PM) Arthritis Back Pain Breast Cancer High blood pressure Hypercholesterolemia Lump In Breast     Review of Systems (Tanisha A. Brown RMA; 04/20/2017 2:31 PM) General Present- Weight Gain. Not Present- Appetite Loss, Chills, Fatigue, Fever, Night Sweats and Weight Loss. Skin Not Present- Change in Wart/Mole, Dryness, Hives, Jaundice, New Lesions, Non-Healing Wounds, Rash and Ulcer. HEENT Not Present- Earache, Hearing Loss, Hoarseness, Nose Bleed, Oral Ulcers, Ringing in the Ears, Seasonal Allergies, Sinus Pain, Sore Throat, Visual Disturbances, Wears glasses/contact lenses and Yellow Eyes. Respiratory Not Present- Bloody sputum, Chronic Cough, Difficulty Breathing, Snoring and Wheezing. Breast Present- Breast Mass and Breast Pain. Not Present- Nipple Discharge and Skin Changes. Cardiovascular Not Present- Chest Pain, Difficulty Breathing Lying Down, Leg Cramps, Palpitations, Rapid Heart Rate, Shortness of Breath and Swelling of Extremities. Gastrointestinal Not Present- Abdominal Pain, Bloating, Bloody Stool, Change in Bowel Habits, Chronic diarrhea, Constipation, Difficulty Swallowing, Excessive gas, Gets full quickly at meals, Hemorrhoids, Indigestion, Nausea, Rectal Pain and Vomiting. Female Genitourinary Not Present- Frequency, Nocturia, Painful Urination, Pelvic Pain and Urgency. Musculoskeletal Present- Back Pain and Joint Pain. Not Present- Joint Stiffness, Muscle Pain, Muscle Weakness and Swelling of  Extremities. Neurological Not Present- Decreased Memory, Fainting, Headaches, Numbness, Seizures, Tingling, Tremor, Trouble walking and Weakness. Psychiatric Not Present- Anxiety, Bipolar, Change in Sleep Pattern, Depression, Fearful and Frequent crying. Endocrine Not Present- Cold Intolerance, Excessive Hunger, Hair Changes, Heat Intolerance, Hot flashes and New Diabetes. Hematology Not Present- Blood Thinners, Easy Bruising, Excessive bleeding, Gland problems, HIV and Persistent Infections.  Vitals (Tanisha A. Brown RMA; 04/20/2017 2:33 PM) 04/20/2017 2:32 PM Weight: 152.6 lb Height: 62in Body Surface Area: 1.7 m Body Mass Index: 27.91 kg/m  Temp.: 98.86F  Pulse: 72 (  Regular)  BP: 138/82 (Sitting, Left Arm, Standard)      Physical Exam (Lilianah Buffin A. Cedra Villalon MD; 04/20/2017 3:07 PM)  General Mental Status-Alert. General Appearance-Consistent with stated age. Hydration-Well hydrated. Voice-Normal.  Head and Neck Head-normocephalic, atraumatic with no lesions or palpable masses. Trachea-midline. Thyroid Gland Characteristics - normal size and consistency.  Eye Eyeball - Bilateral-Extraocular movements intact. Sclera/Conjunctiva - Bilateral-No scleral icterus.  Chest and Lung Exam Chest and lung exam reveals -quiet, even and easy respiratory effort with no use of accessory muscles and on auscultation, normal breath sounds, no adventitious sounds and normal vocal resonance. Inspection Chest Wall - Normal. Back - normal.  Breast Note: Bilateral bruising in both breasts. This is primarily in the upper outer quadrants.  Cardiovascular Cardiovascular examination reveals -normal heart sounds, regular rate and rhythm with no murmurs and normal pedal pulses bilaterally.  Neurologic Neurologic evaluation reveals -alert and oriented x 3 with no impairment of recent or remote memory. Mental Status-Normal.  Lymphatic Head & Neck  General Head  & Neck Lymphatics: Bilateral - Description - Normal. Axillary  General Axillary Region: Bilateral - Description - Normal. Tenderness - Non Tender.    Assessment & Plan (Solae Norling A. Kaydance Bowie MD; 04/20/2017 4:42 PM)  BREAST CANCER, RIGHT (C50.911)  MALIGNANT NEOPLASM OF UPPER-OUTER QUADRANT OF RIGHT BREAST IN FEMALE, ESTROGEN RECEPTOR POSITIVE (C50.411) Impression: Patient surgeon right breast lumpectomy with sentinel lymph node mapping. We'll refer to medical and radiation oncology as well Risk of lumpectomy include bleeding, infection, seroma, more surgery, use of seed/wire, wound care, cosmetic deformity and the need for other treatments, death , blood clots, death. Pt agrees to proceed. Risk of sentinel lymph node mapping include bleeding, infection, lymphedema, shoulder pain. stiffness, dye allergy. cosmetic deformity , blood clots, death, need for more surgery. Pt agres to proceed.  Current Plans You are being scheduled for surgery- Our schedulers will call you.  You should hear from our office's scheduling department within 5 working days about the location, date, and time of surgery. We try to make accommodations for patient's preferences in scheduling surgery, but sometimes the OR schedule or the surgeon's schedule prevents [place] from making those accommodations.  If you have not heard from our office [phone]) in 5 working days, call the office and ask for your surgeon's nurse.  If you have other questions about your diagnosis, plan, or surgery, call the office and ask for your surgeon's nurse.  Pt Education - [facility] Breast Cancer Information Given - [facility] "Breast Journey" Package We discussed the staging and pathophysiology of breast cancer. We discussed all of the different options for treatment for breast cancer including surgery, chemotherapy, radiation therapy, Herceptin, and antiestrogen therapy. We discussed a sentinel lymph node biopsy as she does not appear to having lymph  node involvement right now. We discussed the performance of that with injection of radioactive tracer and blue dye. We discussed that she would have an incision underneath her axillary hairline. We discussed that there is a bout a 10-20% chance of having a positive node with a sentinel lymph node biopsy and we will await the permanent pathology to make any other first further decisions in terms of her treatment. One of these options might be to return to the operating room to perform an axillary lymph node dissection. We discussed about a 1-2% risk lifetime of chronic shoulder pain as well as lymphedema associated with a sentinel lymph node biopsy. We discussed the options for treatment of the breast cancer which included lumpectomy versus  a mastectomy. We discussed the performance of the lumpectomy with a wire placement. We discussed a 10-20% chance of a positive margin requiring reexcision in the operating room. We also discussed that she may need radiation therapy or antiestrogen therapy or both if she undergoes lumpectomy. We discussed the mastectomy and the postoperative care for that as well. We discussed that there is no difference in her survival whether she undergoes lumpectomy with radiation therapy or antiestrogen therapy versus a mastectomy. There is a slight difference in the local recurrence rate being 3-5% with lumpectomy and about 1% with a mastectomy. We discussed the risks of operation including bleeding, infection, possible reoperation. She understands her further therapy will be based on what her stages at the time of her operation.  Pt Education - Pamphlet Given - Breast Biopsy: discussed with patient and provided information. Pt Education - flb breast cancer surgery: discussed with patient and provided information. Pt Education - [facility] Breast Biopsy HCI: discussed with patient and provided information. PAPILLOMA OF BREAST (D24.9)  PAPILLOMA OF LEFT BREAST (D24.2) Impression: Recommend  excision. Small risk of malignancy discussed Risk of lumpectomy include bleeding, infection, seroma, more surgery, use of seed/wire, wound care, cosmetic deformity and the need for other treatments, death , blood clots, death. Pt agrees to proceed.

## 2017-04-21 ENCOUNTER — Ambulatory Visit
Admission: RE | Admit: 2017-04-21 | Discharge: 2017-04-21 | Disposition: A | Discharge status: Home / Self Care | Payer: Medicare Other | Source: Ambulatory Visit | Attending: Surgery | Admitting: Surgery

## 2017-04-21 DIAGNOSIS — C50911 Malignant neoplasm of unspecified site of right female breast: Secondary | ICD-10-CM

## 2017-04-21 DIAGNOSIS — Z170 Estrogen receptor positive status [ER+]: Principal | ICD-10-CM

## 2017-04-26 ENCOUNTER — Encounter: Payer: Self-pay | Admitting: Radiation Oncology

## 2017-05-01 ENCOUNTER — Encounter (HOSPITAL_BASED_OUTPATIENT_CLINIC_OR_DEPARTMENT_OTHER): Payer: Self-pay | Admitting: *Deleted

## 2017-05-03 ENCOUNTER — Encounter (HOSPITAL_BASED_OUTPATIENT_CLINIC_OR_DEPARTMENT_OTHER)
Admission: RE | Admit: 2017-05-03 | Discharge: 2017-05-03 | Disposition: A | Discharge status: Home / Self Care | Payer: Medicare Other | Source: Ambulatory Visit | Attending: Surgery | Admitting: Surgery

## 2017-05-03 DIAGNOSIS — Z01818 Encounter for other preprocedural examination: Secondary | ICD-10-CM | POA: Diagnosis not present

## 2017-05-03 LAB — CBC WITH DIFFERENTIAL/PLATELET
Basophils Absolute: 0 10*3/uL (ref 0.0–0.1)
Basophils Relative: 0 %
EOS ABS: 0 10*3/uL (ref 0.0–0.7)
EOS PCT: 0 %
HCT: 43.9 % (ref 36.0–46.0)
Hemoglobin: 14.3 g/dL (ref 12.0–15.0)
LYMPHS ABS: 2 10*3/uL (ref 0.7–4.0)
LYMPHS PCT: 30 %
MCH: 30.6 pg (ref 26.0–34.0)
MCHC: 32.6 g/dL (ref 30.0–36.0)
MCV: 93.8 fL (ref 78.0–100.0)
MONO ABS: 0.5 10*3/uL (ref 0.1–1.0)
MONOS PCT: 8 %
Neutro Abs: 4.1 10*3/uL (ref 1.7–7.7)
Neutrophils Relative %: 62 %
PLATELETS: 233 10*3/uL (ref 150–400)
RBC: 4.68 MIL/uL (ref 3.87–5.11)
RDW: 14.7 % (ref 11.5–15.5)
WBC: 6.7 10*3/uL (ref 4.0–10.5)

## 2017-05-03 LAB — COMPREHENSIVE METABOLIC PANEL
ALT: 25 U/L (ref 14–54)
ANION GAP: 7 (ref 5–15)
AST: 24 U/L (ref 15–41)
Albumin: 4.2 g/dL (ref 3.5–5.0)
Alkaline Phosphatase: 63 U/L (ref 38–126)
BUN: 24 mg/dL — ABNORMAL HIGH (ref 6–20)
CHLORIDE: 105 mmol/L (ref 101–111)
CO2: 28 mmol/L (ref 22–32)
CREATININE: 0.74 mg/dL (ref 0.44–1.00)
Calcium: 10 mg/dL (ref 8.9–10.3)
Glucose, Bld: 88 mg/dL (ref 65–99)
POTASSIUM: 4.2 mmol/L (ref 3.5–5.1)
Sodium: 140 mmol/L (ref 135–145)
Total Bilirubin: 1 mg/dL (ref 0.3–1.2)
Total Protein: 7.1 g/dL (ref 6.5–8.1)

## 2017-05-03 NOTE — Progress Notes (Signed)
Pt arrived at [facility] for blood work and EKG. Gave pt ensure presurgery drink with written instructions taped on. Reviewed that she must have drink finished on DOS by 0500 and that it will be more palatable if it has been refrigerated. Also gave pt bottle of hibiclens soap and instructed to use half in shower evening prior to surgery and other half with morning shower on DOS. Reminded pt that soap may cause shower floor to become slippery and that she should use caution. Pt verbalized understanding of all instructions.

## 2017-05-04 ENCOUNTER — Encounter: Payer: Self-pay | Admitting: Hematology and Oncology

## 2017-05-04 DIAGNOSIS — Z23 Encounter for immunization: Secondary | ICD-10-CM | POA: Diagnosis not present

## 2017-05-05 ENCOUNTER — Ambulatory Visit
Admission: RE | Admit: 2017-05-05 | Discharge: 2017-05-05 | Disposition: A | Discharge status: Home / Self Care | Payer: Medicare Other | Source: Ambulatory Visit | Attending: Surgery | Admitting: Surgery

## 2017-05-05 DIAGNOSIS — Z170 Estrogen receptor positive status [ER+]: Principal | ICD-10-CM

## 2017-05-05 DIAGNOSIS — D242 Benign neoplasm of left breast: Secondary | ICD-10-CM | POA: Diagnosis not present

## 2017-05-05 DIAGNOSIS — C50911 Malignant neoplasm of unspecified site of right female breast: Secondary | ICD-10-CM

## 2017-05-09 ENCOUNTER — Encounter (HOSPITAL_BASED_OUTPATIENT_CLINIC_OR_DEPARTMENT_OTHER): Payer: Self-pay

## 2017-05-09 ENCOUNTER — Ambulatory Visit (HOSPITAL_BASED_OUTPATIENT_CLINIC_OR_DEPARTMENT_OTHER): Payer: Medicare Other | Admitting: Anesthesiology

## 2017-05-09 ENCOUNTER — Ambulatory Visit (HOSPITAL_COMMUNITY)
Admission: RE | Admit: 2017-05-09 | Discharge: 2017-05-09 | Disposition: A | Discharge status: Home / Self Care | Payer: Medicare Other | Source: Ambulatory Visit | Attending: Surgery | Admitting: Surgery

## 2017-05-09 ENCOUNTER — Encounter (HOSPITAL_BASED_OUTPATIENT_CLINIC_OR_DEPARTMENT_OTHER)
Admission: RE | Disposition: A | Discharge status: Home / Self Care | Payer: Self-pay | Source: Ambulatory Visit | Attending: Surgery

## 2017-05-09 ENCOUNTER — Ambulatory Visit
Admission: RE | Admit: 2017-05-09 | Discharge: 2017-05-09 | Disposition: A | Discharge status: Home / Self Care | Payer: Medicare Other | Source: Ambulatory Visit | Attending: Surgery | Admitting: Surgery

## 2017-05-09 ENCOUNTER — Ambulatory Visit (HOSPITAL_BASED_OUTPATIENT_CLINIC_OR_DEPARTMENT_OTHER)
Admission: RE | Admit: 2017-05-09 | Discharge: 2017-05-09 | Disposition: A | Discharge status: Home / Self Care | Payer: Medicare Other | Source: Ambulatory Visit | Attending: Surgery | Admitting: Surgery

## 2017-05-09 DIAGNOSIS — I2510 Atherosclerotic heart disease of native coronary artery without angina pectoris: Secondary | ICD-10-CM | POA: Diagnosis not present

## 2017-05-09 DIAGNOSIS — I10 Essential (primary) hypertension: Secondary | ICD-10-CM | POA: Diagnosis not present

## 2017-05-09 DIAGNOSIS — C50911 Malignant neoplasm of unspecified site of right female breast: Secondary | ICD-10-CM

## 2017-05-09 DIAGNOSIS — E7800 Pure hypercholesterolemia, unspecified: Secondary | ICD-10-CM | POA: Diagnosis not present

## 2017-05-09 DIAGNOSIS — Z170 Estrogen receptor positive status [ER+]: Secondary | ICD-10-CM | POA: Diagnosis not present

## 2017-05-09 DIAGNOSIS — Z79899 Other long term (current) drug therapy: Secondary | ICD-10-CM | POA: Diagnosis not present

## 2017-05-09 DIAGNOSIS — M069 Rheumatoid arthritis, unspecified: Secondary | ICD-10-CM | POA: Insufficient documentation

## 2017-05-09 DIAGNOSIS — D242 Benign neoplasm of left breast: Secondary | ICD-10-CM | POA: Diagnosis not present

## 2017-05-09 DIAGNOSIS — D241 Benign neoplasm of right breast: Secondary | ICD-10-CM

## 2017-05-09 DIAGNOSIS — C50411 Malignant neoplasm of upper-outer quadrant of right female breast: Secondary | ICD-10-CM | POA: Diagnosis not present

## 2017-05-09 DIAGNOSIS — R928 Other abnormal and inconclusive findings on diagnostic imaging of breast: Secondary | ICD-10-CM | POA: Diagnosis not present

## 2017-05-09 DIAGNOSIS — K219 Gastro-esophageal reflux disease without esophagitis: Secondary | ICD-10-CM | POA: Insufficient documentation

## 2017-05-09 DIAGNOSIS — Z87891 Personal history of nicotine dependence: Secondary | ICD-10-CM | POA: Insufficient documentation

## 2017-05-09 HISTORY — DX: Adverse effect of unspecified anesthetic, initial encounter: T41.45XA

## 2017-05-09 HISTORY — PX: BREAST LUMPECTOMY WITH RADIOACTIVE SEED AND SENTINEL LYMPH NODE BIOPSY: SHX6550

## 2017-05-09 HISTORY — DX: Malignant (primary) neoplasm, unspecified: C80.1

## 2017-05-09 HISTORY — DX: Other complications of anesthesia, initial encounter: T88.59XA

## 2017-05-09 HISTORY — DX: Gastro-esophageal reflux disease without esophagitis: K21.9

## 2017-05-09 HISTORY — DX: Rosacea, unspecified: L71.9

## 2017-05-09 SURGERY — BREAST LUMPECTOMY WITH RADIOACTIVE SEED AND SENTINEL LYMPH NODE BIOPSY
Anesthesia: Regional | Site: Breast | Laterality: Bilateral

## 2017-05-09 MED ORDER — MIDAZOLAM HCL 2 MG/2ML IJ SOLN
INTRAMUSCULAR | Status: AC
  Filled 2017-05-09: qty 2

## 2017-05-09 MED ORDER — FENTANYL CITRATE (PF) 100 MCG/2ML IJ SOLN: 25.0000 ug | INTRAMUSCULAR | Status: DC | PRN

## 2017-05-09 MED ORDER — LACTATED RINGERS IV SOLN
INTRAVENOUS | Status: DC
  Administered 2017-05-09 (×2): via INTRAVENOUS

## 2017-05-09 MED ORDER — FENTANYL CITRATE (PF) 100 MCG/2ML IJ SOLN
INTRAMUSCULAR | Status: AC
  Filled 2017-05-09: qty 2

## 2017-05-09 MED ORDER — FENTANYL CITRATE (PF) 100 MCG/2ML IJ SOLN
50.0000 ug | INTRAMUSCULAR | Status: AC | PRN
  Administered 2017-05-09 (×4): 50 ug via INTRAVENOUS

## 2017-05-09 MED ORDER — DEXAMETHASONE SODIUM PHOSPHATE 4 MG/ML IJ SOLN
INTRAMUSCULAR | Status: DC | PRN
  Administered 2017-05-09: 8 mg via INTRAVENOUS

## 2017-05-09 MED ORDER — EPHEDRINE SULFATE 50 MG/ML IJ SOLN
INTRAMUSCULAR | Status: DC | PRN
  Administered 2017-05-09 (×2): 10 mg via INTRAVENOUS

## 2017-05-09 MED ORDER — LIDOCAINE HCL (CARDIAC) 20 MG/ML IV SOLN
INTRAVENOUS | Status: DC | PRN
  Administered 2017-05-09: 30 mg via INTRAVENOUS

## 2017-05-09 MED ORDER — TECHNETIUM TC 99M SULFUR COLLOID FILTERED
1.0000 | Freq: Once | INTRAVENOUS | Status: AC | PRN
  Administered 2017-05-09: 1 via INTRADERMAL

## 2017-05-09 MED ORDER — BUPIVACAINE-EPINEPHRINE (PF) 0.25% -1:200000 IJ SOLN
INTRAMUSCULAR | Status: DC | PRN
  Administered 2017-05-09: 19 mL

## 2017-05-09 MED ORDER — ONDANSETRON HCL 4 MG/2ML IJ SOLN: 4.0000 mg | Freq: Once | INTRAMUSCULAR | Status: DC | PRN

## 2017-05-09 MED ORDER — GABAPENTIN 300 MG PO CAPS
ORAL_CAPSULE | ORAL | Status: AC
Start: 2017-05-09 — End: ?
  Filled 2017-05-09: qty 1

## 2017-05-09 MED ORDER — CHLORHEXIDINE GLUCONATE CLOTH 2 % EX PADS: 6.0000 | MEDICATED_PAD | Freq: Once | CUTANEOUS | Status: DC

## 2017-05-09 MED ORDER — SCOPOLAMINE 1 MG/3DAYS TD PT72: 1.0000 | MEDICATED_PATCH | Freq: Once | TRANSDERMAL | Status: DC | PRN

## 2017-05-09 MED ORDER — GABAPENTIN 300 MG PO CAPS
300.0000 mg | ORAL_CAPSULE | ORAL | Status: AC
  Administered 2017-05-09: 300 mg via ORAL

## 2017-05-09 MED ORDER — ROPIVACAINE HCL 5 MG/ML IJ SOLN
INTRAMUSCULAR | Status: DC | PRN
  Administered 2017-05-09: 30 mL via PERINEURAL

## 2017-05-09 MED ORDER — MIDAZOLAM HCL 2 MG/2ML IJ SOLN: 1.0000 mg | INTRAMUSCULAR | Status: DC | PRN

## 2017-05-09 MED ORDER — ACETAMINOPHEN 500 MG PO TABS
1000.0000 mg | ORAL_TABLET | ORAL | Status: AC
  Administered 2017-05-09: 1000 mg via ORAL

## 2017-05-09 MED ORDER — PROPOFOL 10 MG/ML IV BOLUS
INTRAVENOUS | Status: DC | PRN
  Administered 2017-05-09: 100 mg via INTRAVENOUS
  Administered 2017-05-09: 50 mg via INTRAVENOUS

## 2017-05-09 MED ORDER — GABAPENTIN 300 MG PO CAPS
ORAL_CAPSULE | ORAL | Status: AC
  Filled 2017-05-09: qty 1

## 2017-05-09 MED ORDER — HYDROCODONE-ACETAMINOPHEN 5-325 MG PO TABS: 1.0000 | ORAL_TABLET | Freq: Four times a day (QID) | ORAL | 0 refills | Status: DC | PRN

## 2017-05-09 MED ORDER — ONDANSETRON HCL 4 MG/2ML IJ SOLN
INTRAMUSCULAR | Status: DC | PRN
  Administered 2017-05-09: 4 mg via INTRAVENOUS

## 2017-05-09 MED ORDER — CEFAZOLIN SODIUM-DEXTROSE 2-4 GM/100ML-% IV SOLN
2.0000 g | INTRAVENOUS | Status: AC
  Administered 2017-05-09: 2 g via INTRAVENOUS

## 2017-05-09 MED ORDER — CEFAZOLIN SODIUM-DEXTROSE 2-4 GM/100ML-% IV SOLN
INTRAVENOUS | Status: AC
  Filled 2017-05-09: qty 100

## 2017-05-09 MED ORDER — ACETAMINOPHEN 500 MG PO TABS
ORAL_TABLET | ORAL | Status: AC
  Filled 2017-05-09: qty 2

## 2017-05-09 SURGICAL SUPPLY — 49 items
APPLIER CLIP 9.375 MED OPEN (MISCELLANEOUS) ×2
BINDER BREAST LRG (GAUZE/BANDAGES/DRESSINGS) IMPLANT
BINDER BREAST MEDIUM (GAUZE/BANDAGES/DRESSINGS) IMPLANT
BINDER BREAST XLRG (GAUZE/BANDAGES/DRESSINGS) ×2 IMPLANT
BINDER BREAST XXLRG (GAUZE/BANDAGES/DRESSINGS) IMPLANT
BLADE SURG 15 STRL LF DISP TIS (BLADE) ×1 IMPLANT
BLADE SURG 15 STRL SS (BLADE) ×1
CANISTER SUC SOCK COL 7IN (MISCELLANEOUS) IMPLANT
CANISTER SUCT 1200ML W/VALVE (MISCELLANEOUS) ×2 IMPLANT
CHLORAPREP W/TINT 26ML (MISCELLANEOUS) ×2 IMPLANT
CLIP APPLIE 9.375 MED OPEN (MISCELLANEOUS) ×1 IMPLANT
COVER BACK TABLE 60X90IN (DRAPES) ×2 IMPLANT
COVER MAYO STAND STRL (DRAPES) ×2 IMPLANT
COVER PROBE W GEL 5X96 (DRAPES) ×2 IMPLANT
DECANTER SPIKE VIAL GLASS SM (MISCELLANEOUS) IMPLANT
DERMABOND ADVANCED (GAUZE/BANDAGES/DRESSINGS) ×1
DERMABOND ADVANCED .7 DNX12 (GAUZE/BANDAGES/DRESSINGS) ×1 IMPLANT
DEVICE DUBIN W/COMP PLATE 8390 (MISCELLANEOUS) ×2 IMPLANT
DRAPE LAPAROSCOPIC ABDOMINAL (DRAPES) ×2 IMPLANT
DRAPE UTILITY XL STRL (DRAPES) ×2 IMPLANT
ELECT COATED BLADE 2.86 ST (ELECTRODE) ×2 IMPLANT
ELECT REM PT RETURN 9FT ADLT (ELECTROSURGICAL) ×2
ELECTRODE REM PT RTRN 9FT ADLT (ELECTROSURGICAL) ×1 IMPLANT
GLOVE BIO SURGEON STRL SZ 6.5 (GLOVE) ×2 IMPLANT
GLOVE BIOGEL PI IND STRL 7.0 (GLOVE) ×1 IMPLANT
GLOVE BIOGEL PI IND STRL 8 (GLOVE) ×1 IMPLANT
GLOVE BIOGEL PI INDICATOR 7.0 (GLOVE) ×1
GLOVE BIOGEL PI INDICATOR 8 (GLOVE) ×1
GLOVE ECLIPSE 8.0 STRL XLNG CF (GLOVE) ×2 IMPLANT
GOWN STRL REUS W/ TWL LRG LVL3 (GOWN DISPOSABLE) ×2 IMPLANT
GOWN STRL REUS W/TWL LRG LVL3 (GOWN DISPOSABLE) ×2
HEMOSTAT ARISTA ABSORB 3G PWDR (MISCELLANEOUS) IMPLANT
HEMOSTAT SNOW SURGICEL 2X4 (HEMOSTASIS) IMPLANT
KIT MARKER MARGIN INK (KITS) ×2 IMPLANT
NDL SAFETY ECLIPSE 18X1.5 (NEEDLE) IMPLANT
NEEDLE HYPO 18GX1.5 SHARP (NEEDLE)
NEEDLE HYPO 25X1 1.5 SAFETY (NEEDLE) ×2 IMPLANT
NS IRRIG 1000ML POUR BTL (IV SOLUTION) ×2 IMPLANT
PACK BASIN DAY SURGERY FS (CUSTOM PROCEDURE TRAY) ×2 IMPLANT
PENCIL BUTTON HOLSTER BLD 10FT (ELECTRODE) ×2 IMPLANT
SLEEVE SCD COMPRESS KNEE MED (MISCELLANEOUS) ×2 IMPLANT
SPONGE LAP 4X18 X RAY DECT (DISPOSABLE) ×2 IMPLANT
SUT MNCRL AB 4-0 PS2 18 (SUTURE) ×2 IMPLANT
SUT VICRYL 3-0 CR8 SH (SUTURE) ×2 IMPLANT
SYR CONTROL 10ML LL (SYRINGE) ×2 IMPLANT
TOWEL OR 17X24 6PK STRL BLUE (TOWEL DISPOSABLE) ×2 IMPLANT
TOWEL OR NON WOVEN STRL DISP B (DISPOSABLE) ×2 IMPLANT
TUBE CONNECTING 20X1/4 (TUBING) ×2 IMPLANT
YANKAUER SUCT BULB TIP NO VENT (SUCTIONS) ×2 IMPLANT

## 2017-05-09 NOTE — Interval H&P Note (Signed)
History and Physical Interval Note:  05/09/2017 8:51 AM  Christina Henderson  has presented today for surgery, with the diagnosis of RIGHT BREAST CANCER, LEFT BREAST PAPILLOMA  The various methods of treatment have been discussed with the patient and family. After consideration of risks, benefits and other options for treatment, the patient has consented to  Procedure(s): BILATERAL RADIOACTIVE SEED GUIDED LUMPECTOMIES WITH RIGHT SENTINEL LYMPH NODE BIOPSY (Bilateral) as a surgical intervention .  The patient's history has been reviewed, patient examined, no change in status, stable for surgery.  I have reviewed the patient's chart and labs.  Questions were answered to the patient's satisfaction.     Ezrah Panning A.

## 2017-05-09 NOTE — Progress Notes (Signed)
Assisted Dr. Ellender with right, ultrasound guided, pectoralis block. Side rails up, monitors on throughout procedure. See vital signs in flow sheet. Tolerated Procedure well. °

## 2017-05-09 NOTE — Anesthesia Preprocedure Evaluation (Addendum)
Anesthesia Evaluation  Patient identified by MRN, date of birth, ID band Patient awake    Reviewed: Allergy & Precautions, NPO status , Patient's Chart, lab work & pertinent test results  Airway Mallampati: II  TM Distance: >3 FB Neck ROM: Full    Dental no notable dental hx.    Pulmonary former smoker,    Pulmonary exam normal breath sounds clear to auscultation       Cardiovascular hypertension, Pt. on medications Normal cardiovascular exam Rhythm:Regular Rate:Normal  ECG: SB, rate 57   Neuro/Psych negative neurological ROS  negative psych ROS   GI/Hepatic Neg liver ROS, GERD (diet related)  Controlled,  Endo/Other  negative endocrine ROS  Renal/GU negative Renal ROS     Musculoskeletal  (+) Arthritis , Rheumatoid disorders,    Abdominal   Peds  Hematology negative hematology ROS (+)   Anesthesia Other Findings  RIGHT BREAST CANCER, LEFT BREAST PAPILLOMA  Reproductive/Obstetrics                            Anesthesia Physical Anesthesia Plan  ASA: II  Anesthesia Plan: General and Regional   Post-op Pain Management: GA combined w/ Regional for post-op pain   Induction: Intravenous  PONV Risk Score and Plan: 3 and Ondansetron, Dexamethasone and Midazolam  Airway Management Planned: LMA  Additional Equipment:   Intra-op Plan:   Post-operative Plan: Extubation in OR  Informed Consent: I have reviewed the patients History and Physical, chart, labs and discussed the procedure including the risks, benefits and alternatives for the proposed anesthesia with the patient or authorized representative who has indicated his/her understanding and acceptance.   Dental advisory given  Plan Discussed with: CRNA  Anesthesia Plan Comments:         Anesthesia Quick Evaluation

## 2017-05-09 NOTE — H&P (View-Only) (Signed)
Christina Henderson 04/20/2017 2:31 PM Location: [facility] Patient #: [id number] DOB: 29-Jul-1935 Widowed / Language: Cleophus Molt / Race: White Female  History of Present Illness Marcello Moores A. Elyse Prevo MD; 04/20/2017 3:06 PM) Patient words: Patient sent at the request of Dr Jeanmarie Plant for mammographically detected abnormalities in both breasts. On the right she had one similar mass in the upper outer quadrant core biopsy proven to be consistent with invasive ductal carcinoma ER positive PR positive HER-2/neu negative. In the left breast in the upper outer quadrant was in and around a core biopsy-proven to be a papilloma. Patient denies history of breast pain, nipple discharge or other abnormality with the breast.          ADDENDUM REPORT: 04/20/2017 10:48 ADDENDUM: Additional 2-D and 3-D images are performed of the left breast. These confirm presence of a discrete nodule with indistinct margins in the upper-outer quadrant of the left breast. Electronically Signed By: Nolon Nations M.D. On: 04/20/2017 10:48 Addended by Nolon Nations, MD on 04/20/2017 10:51 AM  Study Result CLINICAL DATA: Patient returns after screening study for evaluation of possible distortion /mass in the right breast and possible masses in the left breast. EXAM: 2D DIGITAL DIAGNOSTIC BILATERAL MAMMOGRAM WITH CAD AND ADJUNCT TOMO ULTRASOUND BILATERAL BREAST COMPARISON: 03/24/2017, 07/02/2008 ACR Breast Density Category b: There are scattered areas of fibroglandular density. FINDINGS: Additional 2-D and 3-D images are performed. In the right breast, there is a spiculated mass in the upper central portion of the breast, further evaluated with ultrasound. Multiple additional spot compression images are performed of the left breast, confirming presence of a discrete nodule with indistinct margins in the upper-outer quadrant of the left breast. There is mild nodularity in the anterior central portion  of the left breast without discrete suspicious mass or distortion. Mammographic images were processed with CAD. On physical exam, I palpate no abnormality in the upper central portion of the right breast. I palpate no abnormality in the upper-outer quadrant of the left breast. Targeted ultrasound is performed, showing an irregular taller than wide mass with posterior acoustic shadowing in the 12 o'clock location of the right breast 5 cm from the nipple measuring 0.8 x 1.1 x 0.5 cm. There is associated internal blood flow. Evaluation of the right axilla is negative for adenopathy. Evaluation of the left breast demonstrates an irregular hypoechoic parallel mass with posterior acoustic enhancement in the 1 o'clock location 5 cm from the nipple which measures 0.6 x 0.5 x 0.3 cm. There is no associated internal blood flow. Evaluation the anterior portion of the breast demonstrates numerous small fibrocystic changes, none suspicious for malignancy. Evaluation of the left axilla is negative for adenopathy. IMPRESSION: 1. Suspicious mass in the 12 o'clock location of the right breast 5 cm from the nipple warranting tissue diagnosis. No evidence for right axillary adenopathy. 2. Indeterminate hypoechoic mass in the 1 o'clock location of the left breast 5 cm from the nipple warranting tissue diagnosis. No evidence for left axillary adenopathy. RECOMMENDATION: 1. Ultrasound-guided core biopsy of mass in the right breast 12 o'clock 5 cm from the nipple. 2. Ultrasound-guided core biopsy of left breast 1 o'clock 5 cm from the nipple. I have discussed the findings and recommendations with the patient. Results were also provided in writing at the conclusion of the visit. If applicable, a reminder letter will be sent to the patient regarding the next appointment. BI-RADS CATEGORY 4: Suspicious. Electronically Signed: By: Nolon Nations M.D. On: 04/12/2017 16:37  Result History  MM  DIAG BREAST TOMO  BILATERAL (Order #[id number]) on 04/20/2017 - Order Result History Report - Result Edited <[email or web address]  US BREAST LTD UNI LEFT INC AXILLA (Order [id number]) - Reflex for Order [id number] Addendum ADDENDUM REPORT: 04/20/2017 10:48 ADDENDUM: Additional 2-D and 3-D images are performed of the left breast. These confirm presence of a discrete nodule with indistinct margins in the upper-outer quadrant of the left breast. Electronically Signed By: Nolon Nations M.D. On: 04/20/2017 10:48 Addended by Nolon Nations, MD on 04/20/2017 10:51 AM  Study Result CLINICAL DATA: Patient returns after screening study for evaluation of possible distortion /mass in the right breast and possible masses in the left breast. EXAM: 2D DIGITAL DIAGNOSTIC BILATERAL MAMMOGRAM WITH CAD AND ADJUNCT TOMO ULTRASOUND BILATERAL BREAST COMPARISON: 03/24/2017, 07/02/2008 ACR Breast Density Category b: There are scattered areas of fibroglandular density. FINDINGS: Additional 2-D and 3-D images are performed. In the right breast, there is a spiculated mass in the upper central portion of the breast, further evaluated with ultrasound. Multiple additional spot compression images are performed of the left breast, confirming presence of a discrete nodule with indistinct margins in the upper-outer quadrant of the left breast. There is mild nodularity in the anterior central portion of the left breast without discrete suspicious mass or distortion. Mammographic images were processed with CAD. On physical exam, I palpate no abnormality in the upper central portion of the right breast. I palpate no abnormality in the upper-outer quadrant of the left breast. Targeted ultrasound is performed, showing an irregular taller than wide mass with posterior acoustic shadowing in the 12 o'clock location of the right breast 5 cm from the nipple measuring 0.8 x 1.1 x 0.5 cm. There is associated internal blood flow.  Evaluation of the right axilla is negative for adenopathy. Evaluation of the left breast demonstrates an irregular hypoechoic parallel mass with posterior acoustic enhancement in the 1 o'clock location 5 cm from the nipple which measures 0.6 x 0.5 x 0.3 cm. There is no associated internal blood flow. Evaluation the anterior portion of the breast demonstrates numerous small fibrocystic changes, none suspicious for malignancy. Evaluation of the left axilla is negative for adenopathy. IMPRESSION: 1. Suspicious mass in the 12 o'clock location of the right breast 5 cm from the nipple warranting tissue diagnosis. No evidence for right axillary adenopathy. 2. Indeterminate hypoechoic mass in the 1 o'clock location of the left breast 5 cm from the nipple warranting tissue diagnosis. No evidence for left axillary adenopathy. RECOMMENDATION: 1. Ultrasound-guided core biopsy of mass in the right breast 12 o'clock 5 cm from the nipple. 2. Ultrasound-guided core biopsy of left breast 1 o'clock 5 cm from the nipple. I have discussed the findings and recommendations with the patient. Results were also provided in writing at the conclusion of the visit. If applicable, a reminder letter will be sent to the patient regarding the next appointment. BI-RADS CATEGORY 4: Suspicious. Electronically Signed: By: Nolon Nations M.D. On: 04/12/2017 16:37                 ADDITIONAL INFORMATION: 1. FLUORESCENCE IN-SITU HYBRIDIZATION Results: HER2 - NEGATIVE RATIO OF HER2/CEP17 SIGNALS 1.26 AVERAGE HER2 COPY NUMBER PER CELL 1.95 Reference Range: NEGATIVE HER2/CEP17 Ratio <2.0 and average HER2 copy number <4.0 EQUIVOCAL HER2/CEP17 Ratio <2.0 and average HER2 copy number >=4.0 and <6.0 POSITIVE HER2/CEP17 Ratio >=2.0 or <2.0 and average HER2 copy number >=6.0 Thressa Sheller MD Pathologist, Electronic Signature ( Signed 04/20/2017) 1. PROGNOSTIC INDICATORS Results: IMMUNOHISTOCHEMICAL AND MORPHOMETRIC  ANALYSIS PERFORMED MANUALLY Estrogen  Receptor: 100%, POSITIVE, STRONG STAINING INTENSITY Progesterone Receptor: 100%, POSITIVE, STRONG STAINING INTENSITY Proliferation Marker Ki67: 1% REFERENCE RANGE ESTROGEN RECEPTOR NEGATIVE 0% POSITIVE =>1% REFERENCE RANGE PROGESTERONE RECEPTOR NEGATIVE 0% POSITIVE =>1% 1 of 3 FINAL for Christina Henderson, Christina Henderson A [phone]) ADDITIONAL INFORMATION:(continued) All controls stained appropriately Thressa Sheller MD Pathologist, Electronic Signature ( Signed 04/18/2017) FINAL DIAGNOSIS Diagnosis 1. Breast, right, needle core biopsy, 12:00 o'clock, 5cmfn - INVASIVE DUCTAL CARCINOMA - DUCTAL CARCINOMA IN SITU - SEE COMMENT 2. Breast, left, needle core biopsy, 1:00 o'clock, 5cmfn - PAPILLARY LESION - USUAL DUCTAL HYPERPLASIA - FIBROCYSTIC CHANGES INCLUDING APOCRINE METAPLASIA - SEE COMMENT Microscopic Comment 1. Based on the biopsy, the carcinoma appears Nottingham grade 1 of 3 and measures 1.0 cm in greatest linear extent. Prognostic markers (ER/PR/ki-67/HER2-FISH) are pending and will be reported in an addendum. Dr. Saralyn Pilar has reviewed the case and agrees with above diagnosis. These results were called to [facility] on April 17, 2017. 2. The papillary lesion has focal architectural atypia but is overall favored to an atypical papilloma. Thressa Sheller MD Pathologist, Electronic Signature (Case signed 04/17/2017) Specimen Gross and Clinical Information Specimen Comment 1. TIF: 12:00 PM; extracted < 1 min; bilateral masses 2. TIF: 12:20 PM; extracted < 1 min; bilateral masses Specimen(s) Obtained: 1. Breast, right, needle core biopsy, 12:00 o'clock, 5cmfn 2. Breast, left, needle core biopsy, 1:00 o'clock, 5cmfn Specimen Clinical Information 1. Concern for invasive cancer 2. Cancer vs FCC 2 of 3 FINAL for Christina Henderson, Christina Henderson A [phone]) Gross 1. Received labeled "Christina Henderson,Christina Henderson" and "Right breast 12:00" (TIF 1200 CIT <18mn) are 4  cores of yellow to gray white soft to firm tissue, ranging from 1 x 0.15 x 0.15 cm to 1.5 x 0.15 x 0.15 cm. One block submitted. (SSW 10/5) 2. Received labeled "Christina Henderson,Christina Henderson" and "Left breast 1:00" (TIF 1220 CIT <169m) are 3 cores of yellow to gray white soft to firm tissue, ranging from 1.1 x 0.15 x 0.15 cm to 1.3 x 0.2 x 0.15 cm. One block submitted. (SSW 10/5) Stain(s) used in Diagnosis: The following stain(s) were used in diagnosing the case: Her2 FISH. The control(s) stained appropriately. Disclaimer Estrogen receptor (6F11), immunohistochemical stains are performed on formalin fixed, paraffin embedded tissue using a 3,3"-diaminobenzidine (DAB) chromogen and Leica Bond Autostainer System. The staining intensity of the nucleus is scored manually and is reported as the percentage of tumor cell nuclei demonstrating specific nuclear staining.Specimens are fixed in 10% Neutral Buffered Formalin for at least 6 hours and up to 72 hours. These tests have not be validated on decalcified tissue. Results should be interpreted with caution given the possibility of false negative results on decalcified specimens. HER2 IQFISH pharmDX (code [id number] a direct fluorescence in-situ hybridization assay designed to quantitatively determine HER2 gene amplification in formalin-fixed, paraffin-embedded tissue specimens. [facility] is reported using ASCO/CAP scoring criteria published in 2013. Ki-67 (MM1), immunohistochemical stains are performed on formalin fixed, paraffin embedded tissue using a 3,3"-diaminobenzidine (DAB) chromogen and Leica Bond Autostainer System. The staining intensity of the nucleus is scored manually and is reported as the percentage of tumor cell nuclei demonstrating specific nuclear staining.Specimens are fixed in 10% Neutral Buffered Formalin for at least 6 hours and up to 72 hours. These tests have not be validated on decalcified tissue. Results should be  interpreted with caution given the possibility of false negative results on decalcified specimens. PR progesterone receptor (16), immunohistochemical stains are performed on formalin fixed, paraffin embedded tissue using  a 3,3"-diaminobenzidine (DAB) chromogen and Leica Bond Autostainer System. The staining intensity of the nucleus is scored manually and is reported as the percentage of tumor cell nuclei demonstrating specific nuclear staining.Specimens are fixed in 10% Neutral Buffered Formalin for at least 6 hours and up to 72 hours. These tests have not be validated on decalcified tissue. Results should be interpreted with caution given the possibility of false negative results on decalcified specimens. Report signed out from the following location(s) Technical Component was performed at [facility]. [address],[place] [id number]., Technical component and interpretation was performed at [place], [place], [place] [address]. CLIA #: [id number], 3 of.  The patient is a 81 year old female.   Past Surgical History (Tanisha A. Owens Shark, [place]; 04/20/2017 2:31 PM) Appendectomy Cataract Surgery Bilateral. Tonsillectomy  Diagnostic Studies History (Tanisha A. Owens Shark, [place]; 04/20/2017 2:31 PM) Colonoscopy 1-5 years ago Mammogram within last year Pap Smear >5 years ago  Allergies (Tanisha A. Owens Shark, [place]; 04/20/2017 2:33 PM) Sulfa Antibiotics Statins Allergies Reconciled  Medication History (Tanisha A. Owens Shark, [place]; 04/20/2017 2:34 PM) Losartan Potassium (100MG Tablet, Oral) Active. Hydroxychloroquine Sulfate (200MG Tablet, Oral) Active. Dilt-XR (180MG Capsule ER 24HR, Oral) Active. Probiotic (Oral) Active. Medications Reconciled  Social History (Tanisha A. Owens Shark, [place]; 04/20/2017 2:31 PM) Alcohol use Occasional alcohol use. No caffeine use No drug use Tobacco use Former  smoker.  Family History (Tanisha A. Owens Shark, [place]; 04/20/2017 2:31 PM) Anesthetic complications Brother. Arthritis Mother. Hypertension Mother. Thyroid problems Father.  Pregnancy / Birth History (Tanisha A. Owens Shark, [place]; 04/20/2017 2:31 PM) Age at menarche 40 years. Age of menopause 27-50 Contraceptive History Oral contraceptives. Gravida 2 Length (months) of breastfeeding 3-6 Maternal age 23-25 Para 2  Other Problems (Tanisha A. Owens Shark, [place]; 04/20/2017 2:31 PM) Arthritis Back Pain Breast Cancer High blood pressure Hypercholesterolemia Lump In Breast     Review of Systems (Tanisha A. Brown RMA; 04/20/2017 2:31 PM) General Present- Weight Gain. Not Present- Appetite Loss, Chills, Fatigue, Fever, Night Sweats and Weight Loss. Skin Not Present- Change in Wart/Mole, Dryness, Hives, Jaundice, New Lesions, Non-Healing Wounds, Rash and Ulcer. HEENT Not Present- Earache, Hearing Loss, Hoarseness, Nose Bleed, Oral Ulcers, Ringing in the Ears, Seasonal Allergies, Sinus Pain, Sore Throat, Visual Disturbances, Wears glasses/contact lenses and Yellow Eyes. Respiratory Not Present- Bloody sputum, Chronic Cough, Difficulty Breathing, Snoring and Wheezing. Breast Present- Breast Mass and Breast Pain. Not Present- Nipple Discharge and Skin Changes. Cardiovascular Not Present- Chest Pain, Difficulty Breathing Lying Down, Leg Cramps, Palpitations, Rapid Heart Rate, Shortness of Breath and Swelling of Extremities. Gastrointestinal Not Present- Abdominal Pain, Bloating, Bloody Stool, Change in Bowel Habits, Chronic diarrhea, Constipation, Difficulty Swallowing, Excessive gas, Gets full quickly at meals, Hemorrhoids, Indigestion, Nausea, Rectal Pain and Vomiting. Female Genitourinary Not Present- Frequency, Nocturia, Painful Urination, Pelvic Pain and Urgency. Musculoskeletal Present- Back Pain and Joint Pain. Not Present- Joint Stiffness, Muscle Pain, Muscle Weakness and Swelling of  Extremities. Neurological Not Present- Decreased Memory, Fainting, Headaches, Numbness, Seizures, Tingling, Tremor, Trouble walking and Weakness. Psychiatric Not Present- Anxiety, Bipolar, Change in Sleep Pattern, Depression, Fearful and Frequent crying. Endocrine Not Present- Cold Intolerance, Excessive Hunger, Hair Changes, Heat Intolerance, Hot flashes and New Diabetes. Hematology Not Present- Blood Thinners, Easy Bruising, Excessive bleeding, Gland problems, HIV and Persistent Infections.  Vitals (Tanisha A. Brown RMA; 04/20/2017 2:33 PM) 04/20/2017 2:32 PM Weight: 152.6 lb Height: 62in Body Surface Area: 1.7 m Body Mass Index: 27.91 kg/m  Temp.: 98.77F  Pulse: 72 (  Regular)  BP: 138/82 (Sitting, Left Arm, Standard)      Physical Exam (Yosgart Pavey A. Wilbern Pennypacker MD; 04/20/2017 3:07 PM)  General Mental Status-Alert. General Appearance-Consistent with stated age. Hydration-Well hydrated. Voice-Normal.  Head and Neck Head-normocephalic, atraumatic with no lesions or palpable masses. Trachea-midline. Thyroid Gland Characteristics - normal size and consistency.  Eye Eyeball - Bilateral-Extraocular movements intact. Sclera/Conjunctiva - Bilateral-No scleral icterus.  Chest and Lung Exam Chest and lung exam reveals -quiet, even and easy respiratory effort with no use of accessory muscles and on auscultation, normal breath sounds, no adventitious sounds and normal vocal resonance. Inspection Chest Wall - Normal. Back - normal.  Breast Note: Bilateral bruising in both breasts. This is primarily in the upper outer quadrants.  Cardiovascular Cardiovascular examination reveals -normal heart sounds, regular rate and rhythm with no murmurs and normal pedal pulses bilaterally.  Neurologic Neurologic evaluation reveals -alert and oriented x 3 with no impairment of recent or remote memory. Mental Status-Normal.  Lymphatic Head & Neck  General Head  & Neck Lymphatics: Bilateral - Description - Normal. Axillary  General Axillary Region: Bilateral - Description - Normal. Tenderness - Non Tender.    Assessment & Plan (Ardice Boyan A. Ladye Macnaughton MD; 04/20/2017 4:42 PM)  BREAST CANCER, RIGHT (C50.911)  MALIGNANT NEOPLASM OF UPPER-OUTER QUADRANT OF RIGHT BREAST IN FEMALE, ESTROGEN RECEPTOR POSITIVE (C50.411) Impression: Patient surgeon right breast lumpectomy with sentinel lymph node mapping. We'll refer to medical and radiation oncology as well Risk of lumpectomy include bleeding, infection, seroma, more surgery, use of seed/wire, wound care, cosmetic deformity and the need for other treatments, death , blood clots, death. Pt agrees to proceed. Risk of sentinel lymph node mapping include bleeding, infection, lymphedema, shoulder pain. stiffness, dye allergy. cosmetic deformity , blood clots, death, need for more surgery. Pt agres to proceed.  Current Plans You are being scheduled for surgery- Our schedulers will call you.  You should hear from our office's scheduling department within 5 working days about the location, date, and time of surgery. We try to make accommodations for patient's preferences in scheduling surgery, but sometimes the OR schedule or the surgeon's schedule prevents [place] from making those accommodations.  If you have not heard from our office [phone]) in 5 working days, call the office and ask for your surgeon's nurse.  If you have other questions about your diagnosis, plan, or surgery, call the office and ask for your surgeon's nurse.  Pt Education - [facility] Breast Cancer Information Given - [facility] "Breast Journey" Package We discussed the staging and pathophysiology of breast cancer. We discussed all of the different options for treatment for breast cancer including surgery, chemotherapy, radiation therapy, Herceptin, and antiestrogen therapy. We discussed a sentinel lymph node biopsy as she does not appear to having lymph  node involvement right now. We discussed the performance of that with injection of radioactive tracer and blue dye. We discussed that she would have an incision underneath her axillary hairline. We discussed that there is a bout a 10-20% chance of having a positive node with a sentinel lymph node biopsy and we will await the permanent pathology to make any other first further decisions in terms of her treatment. One of these options might be to return to the operating room to perform an axillary lymph node dissection. We discussed about a 1-2% risk lifetime of chronic shoulder pain as well as lymphedema associated with a sentinel lymph node biopsy. We discussed the options for treatment of the breast cancer which included lumpectomy versus  a mastectomy. We discussed the performance of the lumpectomy with a wire placement. We discussed a 10-20% chance of a positive margin requiring reexcision in the operating room. We also discussed that she may need radiation therapy or antiestrogen therapy or both if she undergoes lumpectomy. We discussed the mastectomy and the postoperative care for that as well. We discussed that there is no difference in her survival whether she undergoes lumpectomy with radiation therapy or antiestrogen therapy versus a mastectomy. There is a slight difference in the local recurrence rate being 3-5% with lumpectomy and about 1% with a mastectomy. We discussed the risks of operation including bleeding, infection, possible reoperation. She understands her further therapy will be based on what her stages at the time of her operation.  Pt Education - Pamphlet Given - Breast Biopsy: discussed with patient and provided information. Pt Education - flb breast cancer surgery: discussed with patient and provided information. Pt Education - [facility] Breast Biopsy HCI: discussed with patient and provided information. PAPILLOMA OF BREAST (D24.9)  PAPILLOMA OF LEFT BREAST (D24.2) Impression: Recommend  excision. Small risk of malignancy discussed Risk of lumpectomy include bleeding, infection, seroma, more surgery, use of seed/wire, wound care, cosmetic deformity and the need for other treatments, death , blood clots, death. Pt agrees to proceed.

## 2017-05-09 NOTE — Anesthesia Postprocedure Evaluation (Signed)
Anesthesia Post Note  Patient: Nadea Kirkland  Procedure(s) Performed: BILATERAL RADIOACTIVE SEED GUIDED LUMPECTOMIES WITH RIGHT SENTINEL LYMPH NODE BIOPSY (Bilateral Breast)     Patient location during evaluation: PACU Anesthesia Type: Regional and General Level of consciousness: awake and alert Pain management: pain level controlled Vital Signs Assessment: post-procedure vital signs reviewed and stable Respiratory status: spontaneous breathing, nonlabored ventilation, respiratory function stable and patient connected to nasal cannula oxygen Cardiovascular status: blood pressure returned to baseline and stable Postop Assessment: no apparent nausea or vomiting Anesthetic complications: no    Last Vitals:  Vitals:   05/09/17 1245 05/09/17 1300  BP: (!) 188/70   Pulse: 73   Resp: 16   Temp: 36.6 C   SpO2: 99% 94%    Last Pain:  Vitals:   05/09/17 1300  TempSrc:   PainSc: 0-No pain                 Ryan P Ellender

## 2017-05-09 NOTE — Op Note (Signed)
Preoperative diagnosis: Stage I right breast cancer and left breast papilloma  Postoperative diagnosis: Same  Procedure: Right breast seed localized partial mastectomy with right axillary deep sentinel lymph node mapping and left breast seed localized lumpectomy  Surgeon: Erroll Luna MD  Anesthesia: General with local and right pectoral block  EBL: 30 cc  Specimen: Right breast mass with seeding clip verified by radiology and for right axillary sentinel nodes hot and left breast mass with seeding clip  Drains: None  Indications for procedure: The patient presents for right breast seed localized partial mastectomy for stage I right breast cancer.  She also had a papilloma in her left breast and excision was recommended as well due to the small but finite risk of  Malignancy.The procedure has been discussed with the patient. Alternatives to surgery have been discussed with the patient.  Risks of surgery include bleeding,  Infection,  Seroma formation, death,  and the need for further surgery.   The patient understands and wishes to proceed.Sentinel lymph node mapping and dissection has been discussed with the patient.  Risk of bleeding,  Infection,  Seroma formation,  Additional procedures, arm swelling ,   Shoulder weakness ,  Shoulder stiffness,  Nerve and blood vessel injury and reaction to the mapping dyes have been discussed.  Alternatives to surgery have been discussed with the patient.  The patient agrees to proceed.  Description of procedure: The patient was met in the holding area.  Both breasts were marked in the right side was marked for the lymph node mapping part of the procedure.  She received a block by anesthesia on the right side.  All questions were answered.  She was then taken back to the operating room and placed supine on the OR table.  After induction of general anesthesia, both breasts were prepped and draped in sterile fashion and timeout was done period proper patient and  procedure were discussed.  Films were available for review.  The left side was done first.  The neoprobe was used and the hotspot was identified in the left breast upper outer quadrant.  Curvilinear incision was made in all tissue around the seating clip were excised.  Radiograph revealed both the seed  in clip to be present.  Hemostasis was achieved and the wound was closed with 3-0 Vicryl and 4-0 Monocryl.  The right side was done next.  The neoprobe was used to identify the location of the right breast cancer in the upper outer quadrant.  Curvilinear incision was made along the superior border of the nipple areolar complex.  Dissection was carried into the right upper quadrant of the breast and all tissue around the seating clip were excised with grossly negative margins.  Radiograph revealed the seating clip to be present.  Additional anterior margin was removed.  The wound was made hemostatic.  Clips were used to mark the cavity.  The wound was closed with 3-0 Vicryl and 4-0 Monocryl.  The neoprobe was then switched to technetium settings hotspot identified in the right axilla.  A 4 cm incision was made in the right axilla.  Dissection was carried down into the level 2 lymph node basin which was deep.  For sentinel nodes were identified and removed.  These were all hot.  Background counts approaches 0.  The long thoracic nerve, thoracodorsal trunk and axillary vein were all preserved.  Irrigation was used.  Hemostasis at closed with 3-0 Vicryl and 4-0 Monocryl.  Dermabond applied to all incisions.  All  final counts were found to be correct.  A breast binder was placed.  The patient was awoke extubated taken to recovery in satisfactory condition.

## 2017-05-09 NOTE — Discharge Instructions (Signed)
[facility] °Office Phone Number [phone] ° °BREAST BIOPSY/ PARTIAL MASTECTOMY: POST OP INSTRUCTIONS ° °Always review your discharge instruction sheet given to you by the facility where your surgery was performed. ° °IF YOU HAVE DISABILITY OR FAMILY LEAVE FORMS, YOU MUST BRING THEM TO THE OFFICE FOR PROCESSING.  DO NOT GIVE THEM TO YOUR DOCTOR. ° °1. A prescription for pain medication may be given to you upon discharge.  Take your pain medication as prescribed, if needed.  If narcotic pain medicine is not needed, then you may take acetaminophen (Tylenol) or ibuprofen (Advil) as needed. °2. Take your usually prescribed medications unless otherwise directed °3. If you need a refill on your pain medication, please contact your pharmacy.  They will contact our office to request authorization.  Prescriptions will not be filled after 5pm or on week-ends. °4. You should eat very light the first 24 hours after surgery, such as soup, crackers, pudding, etc.  Resume your normal diet the day after surgery. °5. Most patients will experience some swelling and bruising in the breast.  Ice packs and a good support bra will help.  Swelling and bruising can take several days to resolve.  °6. It is common to experience some constipation if taking pain medication after surgery.  Increasing fluid intake and taking a stool softener will usually help or prevent this problem from occurring.  A mild laxative (Milk of Magnesia or Miralax) should be taken according to package directions if there are no bowel movements after 48 hours. °7. Unless discharge instructions indicate otherwise, you may remove your bandages 24-48 hours after surgery, and you may shower at that time.  You may have steri-strips (small skin tapes) in place directly over the incision.  These strips should be left on the skin for 7-10 days.  If your surgeon used skin glue on the incision, you may shower in 24 hours.  The glue will flake off over the  next 2-3 weeks.  Any sutures or staples will be removed at the office during your follow-up visit. °8. ACTIVITIES:  You may resume regular daily activities (gradually increasing) beginning the next day.  Wearing a good support bra or sports bra minimizes pain and swelling.  You may have sexual intercourse when it is comfortable. °a. You may drive when you no longer are taking prescription pain medication, you can comfortably wear a seatbelt, and you can safely maneuver your car and apply brakes. °b. RETURN TO WORK:  ______________________________________________________________________________________ °9. You should see your doctor in the office for a follow-up appointment approximately two weeks after your surgery.  Your doctor’s nurse will typically make your follow-up appointment when she calls you with your pathology report.  Expect your pathology report 2-3 business days after your surgery.  You may call to check if you do not hear from us after three days. °10. OTHER INSTRUCTIONS: _______________________________________________________________________________________________ _____________________________________________________________________________________________________________________________________ °_____________________________________________________________________________________________________________________________________ °_____________________________________________________________________________________________________________________________________ ° °WHEN TO CALL YOUR DOCTOR: °1. Fever over 101.0 °2. Nausea and/or vomiting. °3. Extreme swelling or bruising. °4. Continued bleeding from incision. °5. Increased pain, redness, or drainage from the incision. ° °The clinic staff is available to answer your questions during regular business hours.  Please don’t hesitate to call and ask to speak to one of the nurses for clinical concerns.  If you have a medical emergency, go to the nearest  emergency room or call 911.  A surgeon from [facility] is always on call at the hospital. ° °For further questions, please visit [email or web address]  ° ° ° ° °  Post Anesthesia Home Care Instructions ° °Activity: °Get plenty of rest for the remainder of the day. A responsible individual must stay with you for 24 hours following the procedure.  °For the next 24 hours, DO NOT: °-Drive a car °-Operate machinery °-Drink alcoholic beverages °-Take any medication unless instructed by your physician °-Make any legal decisions or sign important papers. ° °Meals: °Start with liquid foods such as gelatin or soup. Progress to regular foods as tolerated. Avoid greasy, spicy, heavy foods. If nausea and/or vomiting occur, drink only clear liquids until the nausea and/or vomiting subsides. Call your physician if vomiting continues. ° °Special Instructions/Symptoms: °Your throat may feel dry or sore from the anesthesia or the breathing tube placed in your throat during surgery. If this causes discomfort, gargle with warm salt water. The discomfort should disappear within 24 hours. ° °If you had a scopolamine patch placed behind your ear for the management of post- operative nausea and/or vomiting: ° °1. The medication in the patch is effective for 72 hours, after which it should be removed.  Wrap patch in a tissue and discard in the trash. Wash hands thoroughly with soap and water. °2. You may remove the patch earlier than 72 hours if you experience unpleasant side effects which may include dry mouth, dizziness or visual disturbances. °3. Avoid touching the patch. Wash your hands with soap and water after contact with the patch. °  ° °

## 2017-05-09 NOTE — Anesthesia Procedure Notes (Signed)
Anesthesia Regional Block: Pectoralis block   Pre-Anesthetic Checklist: ,, timeout performed, Correct Patient, Correct Site, Correct Laterality, Correct Procedure,, site marked, risks and benefits discussed, Surgical consent,  Pre-op evaluation,  At surgeon's request and post-op pain management  Laterality: Right  Prep: chloraprep       Needles:  Injection technique: Single-shot  Needle Type: Echogenic Stimulator Needle     Needle Length: 9cm  Needle Gauge: 21     Additional Needles:   Procedures:,,,, ultrasound used (permanent image in chart),,,,  Narrative:  Start time: 05/09/2017 8:20 AM End time: 05/09/2017 8:30 AM Injection made incrementally with aspirations every 5 mL.  Performed by: Personally  Anesthesiologist: Adele Barthel P  Additional Notes: Functioning IV was confirmed and monitors were applied.  A 35mm 21ga Arrow echogenic stimulator needle was used. Sterile prep, hand hygiene and sterile gloves were used.  Negative aspiration and negative test dose prior to incremental administration of local anesthetic. The patient tolerated the procedure well.

## 2017-05-09 NOTE — Transfer of Care (Signed)
Immediate Anesthesia Transfer of Care Note  Patient: Christina Henderson  Procedure(s) Performed: BILATERAL RADIOACTIVE SEED GUIDED LUMPECTOMIES WITH RIGHT SENTINEL LYMPH NODE BIOPSY (Bilateral Breast)  Patient Location: PACU  Anesthesia Type:GA combined with regional for post-op pain  Level of Consciousness: awake and patient cooperative  Airway & Oxygen Therapy: Patient Spontanous Breathing and Patient connected to face mask oxygen  Post-op Assessment: Report given to RN and Post -op Vital signs reviewed and stable  Post vital signs: Reviewed and stable  Last Vitals:  Vitals:   05/09/17 0900 05/09/17 0905  BP: (!) 176/73   Pulse: (!) 51 (!) 53  Resp: 16 16  Temp:    SpO2: 98% 97%    Last Pain:  Vitals:   05/09/17 0731  TempSrc: Oral         Complications: No apparent anesthesia complications

## 2017-05-09 NOTE — Anesthesia Procedure Notes (Signed)
Procedure Name: LMA Insertion Date/Time: 05/09/2017 9:20 AM Performed by: Berneta Sconyers D Pre-anesthesia Checklist: Patient identified, Emergency Drugs available, Suction available and Patient being monitored Patient Re-evaluated:Patient Re-evaluated prior to induction Oxygen Delivery Method: Circle system utilized Preoxygenation: Pre-oxygenation with 100% oxygen Induction Type: IV induction Ventilation: Mask ventilation without difficulty LMA: LMA inserted LMA Size: 3.0 Number of attempts: 1 Airway Equipment and Method: Bite block Placement Confirmation: positive ETCO2 Tube secured with: Tape Dental Injury: Teeth and Oropharynx as per pre-operative assessment

## 2017-05-09 NOTE — Anesthesia Procedure Notes (Signed)
Procedure Name: Intubation Date/Time: 05/09/2017 9:29 AM Performed by: Bertram Haddix D Pre-anesthesia Checklist: Patient identified, Emergency Drugs available, Suction available and Patient being monitored Patient Re-evaluated:Patient Re-evaluated prior to induction Oxygen Delivery Method: Circle system utilized Preoxygenation: Pre-oxygenation with 100% oxygen Induction Type: IV induction Ventilation: Mask ventilation without difficulty Tube type: Oral Tube size: 7.0 mm Number of attempts: 1 Airway Equipment and Method: Stylet and Oral airway Placement Confirmation: ETT inserted through vocal cords under direct vision,  positive ETCO2 and breath sounds checked- equal and bilateral Secured at: 21 cm Tube secured with: Tape Dental Injury: Teeth and Oropharynx as per pre-operative assessment

## 2017-05-10 ENCOUNTER — Encounter (HOSPITAL_BASED_OUTPATIENT_CLINIC_OR_DEPARTMENT_OTHER): Payer: Self-pay | Admitting: Surgery

## 2017-05-15 DIAGNOSIS — M11262 Other chondrocalcinosis, left knee: Secondary | ICD-10-CM | POA: Diagnosis not present

## 2017-05-15 DIAGNOSIS — M238X2 Other internal derangements of left knee: Secondary | ICD-10-CM | POA: Diagnosis not present

## 2017-05-17 NOTE — Progress Notes (Signed)
Location of Breast Cancer: Right Breast  Histology per Pathology Report:  04/14/17 Diagnosis 1. Breast, right, needle core biopsy, 12:00 o'clock, 5cmfn - INVASIVE DUCTAL CARCINOMA - DUCTAL CARCINOMA IN SITU - SEE COMMENT 2. Breast, left, needle core biopsy, 1:00 o'clock, 5cmfn - PAPILLARY LESION - USUAL DUCTAL HYPERPLASIA - FIBROCYSTIC CHANGES INCLUDING APOCRINE METAPLASIA - SEE COMMENT  Receptor Status: ER(100%), PR (100%), Her2-neu (NEG), Ki-(1%)  05/09/17 Diagnosis 1. Breast, lumpectomy, Left - INTRADUCTAL PAPILLOMA. - ATYPICAL DUCTAL HYPERPLASIA. - BIOPSY SITE. - NO MALIGNANCY IDENTIFIED. 2. Breast, lumpectomy, Right - INVASIVE DUCTAL CARCINOMA, GRADE 1, SPANNING 1.1 CM. - LOW GRADE DUCTAL CARCINOMA IN SITU. - RESECTION MARGINS ARE NEGATIVE FOR CARCINOMA. - BIOPSY SITE. - SEE ONCOLOGY TABLE. 3. Breast, excision, Right additional anterior margin - BENIGN BREAST TISSUE. 4. Lymph node, sentinel, biopsy, Right axillary - ONE OF ONE LYMPH NODES NEGATIVE FOR CARCINOMA (0/1). 5. Lymph node, sentinel, biopsy, Right axillary - ONE OF ONE LYMPH NODES NEGATIVE FOR CARCINOMA (0/1). 6. Lymph node, sentinel, biopsy, Right axillary - ONE OF ONE LYMPH NODES NEGATIVE FOR CARCINOMA (0/1).  Did patient present with symptoms or was this found on screening mammography?: It was found on a screening mammogram.   Past/Anticipated interventions by surgeon, if any: 05/09/17 Procedure: Right breast seed localized partial mastectomy with right axillary deep sentinel lymph node mapping and left breast seed localized lumpectomy Surgeon: Thomas Cornett MD   Past/Anticipated interventions by medical oncology, if any:  She has an appointment with Dr. Gudena today at 3:45  Lymphedema issues, if any:  She denies. She has good arm mobility.   Pain issues, if any:  She has tenderness to her incision site due to the compression bra rubbing against the area.   SAFETY ISSUES:  Prior radiation?  No  Pacemaker/ICD? No  Possible current pregnancy? No  Is the patient on methotrexate? No  Current Complaints / other details:    BP (!) 186/88 (BP Location: Right Arm) Comment (BP Location): manual BP  Pulse 60   Temp 98.2 F (36.8 C)   Ht 5' 1" (1.549 m)   Wt 154 lb 3.2 oz (69.9 kg)   SpO2 98% Comment: room air  BMI 29.14 kg/m    Wt Readings from Last 3 Encounters:  05/23/17 154 lb 3.2 oz (69.9 kg)  05/09/17 153 lb (69.4 kg)      Malmfelt, Jennifer L, RN 05/17/2017,11:21 AM   

## 2017-05-22 DIAGNOSIS — M11262 Other chondrocalcinosis, left knee: Secondary | ICD-10-CM | POA: Diagnosis not present

## 2017-05-23 ENCOUNTER — Telehealth: Payer: Self-pay | Admitting: Hematology and Oncology

## 2017-05-23 ENCOUNTER — Encounter: Payer: Self-pay | Admitting: Hematology and Oncology

## 2017-05-23 ENCOUNTER — Ambulatory Visit
Admission: RE | Admit: 2017-05-23 | Discharge: 2017-05-23 | Disposition: A | Discharge status: Home / Self Care | Payer: Medicare Other | Source: Ambulatory Visit | Attending: Radiation Oncology | Admitting: Radiation Oncology

## 2017-05-23 ENCOUNTER — Encounter: Payer: Self-pay | Admitting: Radiation Oncology

## 2017-05-23 ENCOUNTER — Ambulatory Visit (HOSPITAL_BASED_OUTPATIENT_CLINIC_OR_DEPARTMENT_OTHER): Payer: Medicare Other | Admitting: Hematology and Oncology

## 2017-05-23 VITALS — BP 186/88 | HR 60 | Temp 98.2°F | Ht 61.0 in | Wt 154.2 lb

## 2017-05-23 DIAGNOSIS — Z170 Estrogen receptor positive status [ER+]: Secondary | ICD-10-CM | POA: Diagnosis not present

## 2017-05-23 DIAGNOSIS — C50411 Malignant neoplasm of upper-outer quadrant of right female breast: Secondary | ICD-10-CM

## 2017-05-23 DIAGNOSIS — Z9889 Other specified postprocedural states: Secondary | ICD-10-CM | POA: Diagnosis not present

## 2017-05-23 DIAGNOSIS — Z803 Family history of malignant neoplasm of breast: Secondary | ICD-10-CM

## 2017-05-23 MED ORDER — ANASTROZOLE 1 MG PO TABS: 1.0000 mg | ORAL_TABLET | Freq: Every day | ORAL | 3 refills | Status: DC

## 2017-05-23 NOTE — Assessment & Plan Note (Signed)
05/09/2017: Right lumpectomy: IDC grade 1, 1.1 cm, low-grade DCIS, margins negative, 0/3 lymph nodes negative ER 100%, PR 100%, HER-2 negative ratio 1.26, Ki-67 1%, T1CN0 stage I a; left lumpectomy: Intraductal papilloma, ADH  Pathology counseling: I discussed the final pathology report of the patient provided  a copy of this report. I discussed the margins as well as lymph node surgeries. We also discussed the final staging along with previously performed ER/PR and HER-2/neu testing.  Recommendation: 1.  Adjuvant radiation therapy followed by  2. adjuvant antiestrogen therapy with anastrozole 1 mg daily times 5 years  Return to clinic after radiation to start antiestrogen therapy.

## 2017-05-23 NOTE — Progress Notes (Signed)
[place] CONSULT NOTE  Patient Care Team: Cari Caraway, MD as PCP - General (Family Medicine)  CHIEF COMPLAINTS/PURPOSE OF CONSULTATION:  Newly diagnosed breast cancer  HISTORY OF PRESENTING ILLNESS:  Christina Henderson 81 y.o. female is here because of recent diagnosis of right breast cancer.  Patient had a routine screening mammogram that detected abnormalities in both the breast.  The right breast shows suspicious mass at 12 o'clock position measuring 1.1 cm in the left breast 0.6 cm.  Patient underwent bilateral lumpectomies on 05/09/2017.  Right lumpectomy revealed invasive ductal carcinoma grade 1 1.1 cm in size with low-grade DCIS and 3 lymph nodes were negative and the tumor was ER PR positive and HER-2 negative.  Ki-67 was 1%.  Left lumpectomy with intraductal papilloma.  Since the surgery she met with radiation oncology who discussed the pros and cons of adjuvant radiation and patient decided not to undergo radiation.  She is here today to discuss the role of adjuvant antiestrogen therapy in the treatment plan.  She has recovered very well from the surgeries and appears to be doing quite well.  She has no major health issues or concerns  I reviewed her records extensively and collaborated the history with the patient.  SUMMARY OF ONCOLOGIC HISTORY:   Malignant neoplasm of upper-outer quadrant of right breast in female, estrogen receptor positive ([place])   04/12/2017 Initial Diagnosis    right breast suspicious mass at 12 o'clock position 1.1 cm; left breast 1 o'clock position 0.6 cm      05/09/2017 Surgery    Right lumpectomy: IDC grade 1, 1.1 cm, low-grade DCIS, margins negative, 0/3 lymph nodes negative ER 100%, PR 100%, HER-2 negative ratio 1.26, Ki-67 1%, T1CN0 stage I a; left lumpectomy: Intraductal papilloma, ADH      MEDICAL HISTORY:  Past Medical History:  Diagnosis Date  . Arthritis    rhuematoid arthritis  . Cancer ([place]) 03/2017   right breast cancer  .  Complication of anesthesia    hard to wake up  . GERD (gastroesophageal reflux disease)    food related  . Glaucoma   . Hypertension   . Osteoporosis    osteopenia  . Rosacea     SURGICAL HISTORY: Past Surgical History:  Procedure Laterality Date  . APPENDECTOMY    . BACK SURGERY    . EYE SURGERY     cataract  . SPINE SURGERY     lumbar lamenectomy  . TONSILLECTOMY      SOCIAL HISTORY: Social History   Socioeconomic History  . Marital status: Widowed    Spouse name: Not on file  . Number of children: Not on file  . Years of education: Not on file  . Highest education level: Not on file  Social Needs  . Financial resource strain: Not on file  . Food insecurity - worry: Not on file  . Food insecurity - inability: Not on file  . Transportation needs - medical: Not on file  . Transportation needs - non-medical: Not on file  Occupational History  . Not on file  Tobacco Use  . Smoking status: Former Smoker    Last attempt to quit: 07/11/1968    Years since quitting: 48.8  . Smokeless tobacco: Never Used  Substance and Sexual Activity  . Alcohol use: Yes    Comment: social  . Drug use: No  . Sexual activity: Not on file  Other Topics Concern  . Not on file  Social History Narrative  .  Not on file    FAMILY HISTORY: Family History  Problem Relation Age of Onset  . Breast cancer Maternal Aunt     ALLERGIES:  is allergic to caffeine; statins; and sulfa antibiotics.  MEDICATIONS:  Current Outpatient Medications  Medication Sig Dispense Refill  . acetaminophen (TYLENOL) 650 MG CR tablet Take 650 mg every 8 (eight) hours as needed by mouth for pain (Arthrtis Tylenol).     Marland Kitchen anastrozole (ARIMIDEX) 1 MG tablet Take 1 tablet (1 mg total) daily by mouth. 90 tablet 3  . Calcium Carbonate-Vitamin D (CALCIUM 500+D HIGH POTENCY PO) Take by mouth 3 (three) times daily.    . cholecalciferol (VITAMIN D) 1000 units tablet Take 1,000 Units by mouth daily.    Marland Kitchen diltiazem  (DILACOR XR) 180 MG 24 hr capsule Take 180 mg by mouth daily.    . hydroxychloroquine (PLAQUENIL) 200 MG tablet Take by mouth daily.    Marland Kitchen losartan (COZAAR) 100 MG tablet Take 100 mg by mouth daily.    . Multiple Vitamin (MULTIVITAMIN WITH MINERALS) TABS tablet Take 1 tablet by mouth daily.    . Probiotic Product (PROBIOTIC ACIDOPHILUS BIOBEADS) CAPS Take by mouth.     No current facility-administered medications for this visit.     REVIEW OF SYSTEMS:   Constitutional: Denies fevers, chills or abnormal night sweats Eyes: Denies blurriness of vision, double vision or watery eyes Ears, nose, mouth, throat, and face: Denies mucositis or sore throat Respiratory: Denies cough, dyspnea or wheezes Cardiovascular: Denies palpitation, chest discomfort or lower extremity swelling Gastrointestinal:  Denies nausea, heartburn or change in bowel habits Skin: Denies abnormal skin rashes Lymphatics: Denies new lymphadenopathy or easy bruising Neurological:Denies numbness, tingling or new weaknesses Behavioral/Psych: Mood is stable, no new changes  Breast:  Denies any palpable lumps or discharge All other systems were reviewed with the patient and are negative.  PHYSICAL EXAMINATION: ECOG PERFORMANCE STATUS: 1 - Symptomatic but completely ambulatory  Vitals:   05/23/17 1538  BP: (!) 170/58  Pulse: 62  Resp: 16  Temp: 98.2 F (36.8 C)  SpO2: 98%   Filed Weights   05/23/17 1538  Weight: 154 lb (69.9 kg)    GENERAL:alert, no distress and comfortable SKIN: skin color, texture, turgor are normal, no rashes or significant lesions EYES: normal, conjunctiva are pink and non-injected, sclera clear OROPHARYNX:no exudate, no erythema and lips, buccal mucosa, and tongue normal  NECK: supple, thyroid normal size, non-tender, without nodularity LYMPH:  no palpable lymphadenopathy in the cervical, axillary or inguinal LUNGS: clear to auscultation and percussion with normal breathing effort HEART:  regular rate & rhythm and no murmurs and no lower extremity edema ABDOMEN:abdomen soft, non-tender and normal bowel sounds Musculoskeletal:no cyanosis of digits and no clubbing  PSYCH: alert & oriented x 3 with fluent speech NEURO: no focal motor/sensory deficits  LABORATORY DATA:  I have reviewed the data as listed Lab Results  Component Value Date   WBC 6.7 05/03/2017   HGB 14.3 05/03/2017   HCT 43.9 05/03/2017   MCV 93.8 05/03/2017   PLT 233 05/03/2017   Lab Results  Component Value Date   NA 140 05/03/2017   K 4.2 05/03/2017   CL 105 05/03/2017   CO2 28 05/03/2017    RADIOGRAPHIC STUDIES: I have personally reviewed the radiological reports and agreed with the findings in the report.  ASSESSMENT AND PLAN:  Malignant neoplasm of upper-outer quadrant of right breast in female, estrogen receptor positive ([place]) 05/09/2017: Right lumpectomy: IDC grade 1,  1.1 cm, low-grade DCIS, margins negative, 0/3 lymph nodes negative ER 100%, PR 100%, HER-2 negative ratio 1.26, Ki-67 1%, T1CN0 stage I a; left lumpectomy: Intraductal papilloma, ADH  Pathology counseling: I discussed the final pathology report of the patient provided  a copy of this report. I discussed the margins as well as lymph node surgeries. We also discussed the final staging along with previously performed ER/PR and HER-2/neu testing.  Recommendation: 1.  Adjuvant radiation therapy was discussed and the patient decided not to receive radiation. 2. adjuvant antiestrogen therapy with anastrozole 1 mg daily times 5 years We discussed the risks and benefits of anti-estrogen therapy with aromatase inhibitors. These include but not limited to insomnia, hot flashes, mood changes, vaginal dryness, bone density loss, and weight gain. We strongly believe that the benefits far outweigh the risks. Patient understands these risks and consented to starting treatment. Planned treatment duration is 5 years.  After discussing the pros and  cons she decided to try the antiestrogen therapy.  Return to clinic in 3 months for follow-up for survivorship care plan visit  All questions were answered. The patient knows to call the clinic with any problems, questions or concerns.    Rulon Eisenmenger, MD 05/23/17

## 2017-05-23 NOTE — Progress Notes (Signed)
Radiation Oncology         [phone] ________________________________  Initial outpatient Consultation  Name: Christina Henderson MRN: [id number]  Date: 05/23/2017  DOB: 05-02-1936  KV:[id number], Abigail Butts, MD  Erroll Luna, MD   REFERRING PHYSICIAN: Erroll Luna, MD  DIAGNOSIS:    ICD-10-CM   1. Malignant neoplasm of upper-outer quadrant of right breast in female, estrogen receptor positive ([place]) C50.411    Z17.0    Pathologic Stage I, T1c N0 clinical stage M0 right Breast UOQ Invasive Ductal Carcinoma, ER 100% / PR 100% / Her2 NEG, Grade I  CHIEF COMPLAINT: Here to discuss management of right breast cancer.  HISTORY OF PRESENT ILLNESS::Christina Henderson is a 81 y.o. female who presented for screening mammogram on 03/24/17 which showed possible distortion and mass in the right breast.  Diagnostic US on 04/12/17 confirmed suspicious mass at 12 o'clock position of the right breast 5 cm from nipple. Also seen was an indeterminate hypoechoic mass in the 1 o'clock location of the left breast 5 cm from the nipple. There was no evidence of adenopathy in right or left axilla.   Biopsy on 04/14/17 showed invasive ductal carcinoma and DCIS at 12:00 o'clock position of right breast 5 cm from nipple. Left breast abnormality at 1:00 o'clock position was found to be papillary lesion, usual ductal hyperplasia, and fibrocystic changes including apocrine metaplasia.  Dr. Brantley Stage performed left and right breast lumpectomies on 05/09/17. Pathology showed no malignancy at left biopsy site. It did however confirm RIGHT breast invasive ductal carcinoma, Grade I, spanning 1.1 cm ER 100%, PR 100%, Her-2 NEG on right as well as low grade DCIS. All margins were clear by at least 0.5 cm. 0/3 lymph nodes were positive for malignancy.  On review of systems, patient endorses water aerobics for exercise. She endorses well healed surgical scars.  Of note, the patient will soon be leaving town to return the day after  thanksgiving.  She reports Rheumatoid Arthritis but does not take MTX.  PREVIOUS RADIATION THERAPY: No  PAST MEDICAL HISTORY:  has a past medical history of Arthritis, Cancer ([place]) ([id number]), Complication of anesthesia, GERD (gastroesophageal reflux disease), Glaucoma, Hypertension, Osteoporosis, and Rosacea.    PAST SURGICAL HISTORY: Past Surgical History:  Procedure Laterality Date  . APPENDECTOMY    . BACK SURGERY    . EYE SURGERY     cataract  . SPINE SURGERY     lumbar lamenectomy  . TONSILLECTOMY      FAMILY HISTORY: family history includes Breast cancer in her maternal aunt.  SOCIAL HISTORY:  reports that she quit smoking about 48 years ago. she has never used smokeless tobacco. She reports that she drinks alcohol. She reports that she does not use drugs.  ALLERGIES: Caffeine; Statins; and Sulfa antibiotics  MEDICATIONS:  Current Outpatient Medications  Medication Sig Dispense Refill  . acetaminophen (TYLENOL) 650 MG CR tablet Take 650 mg every 8 (eight) hours as needed by mouth for pain (Arthrtis Tylenol).     . Calcium Carbonate-Vitamin D (CALCIUM 500+D HIGH POTENCY PO) Take by mouth 3 (three) times daily.    . cholecalciferol (VITAMIN D) 1000 units tablet Take 1,000 Units by mouth daily.    Marland Kitchen diltiazem (DILACOR XR) 180 MG 24 hr capsule Take 180 mg by mouth daily.    . hydroxychloroquine (PLAQUENIL) 200 MG tablet Take by mouth daily.    Marland Kitchen losartan (COZAAR) 100 MG tablet Take 100 mg by mouth daily.    . Multiple Vitamin (MULTIVITAMIN WITH MINERALS)  TABS tablet Take 1 tablet by mouth daily.    . Probiotic Product (PROBIOTIC ACIDOPHILUS BIOBEADS) CAPS Take by mouth.     No current facility-administered medications for this encounter.     REVIEW OF SYSTEMS: A 10+ POINT REVIEW OF SYSTEMS WAS OBTAINED including neurology, dermatology, psychiatry, cardiac, respiratory, lymph, extremities, GI, GU, Musculoskeletal, constitutional, breasts, reproductive, HEENT.  All pertinent  positives are noted in the HPI.  All others are negative.    PHYSICAL EXAM:  height is _0  (1.549 m) and weight is 154 lb 3.2 oz (69.9 kg). Her temperature is 98.2 F (36.8 C). Her blood pressure is 186/88 (abnormal) and her pulse is 60. Her oxygen saturation is 98%.   General: Alert and oriented, in no acute distress HEENT: Head is normocephalic. Extraocular movements are intact. Oropharynx is clear. Neck: Neck is supple, no palpable cervical or supraclavicular lymphadenopathy. Heart: Regular in rate and rhythm with audible aortic murmur. Chest: Clear to auscultation bilaterally, with no rhonchi, wheezes, or rales. Abdomen: Soft, nontender, nondistended, with no rigidity or guarding. Extremities: No cyanosis or edema. Lymphatics: see Neck Exam Skin: No concerning lesions. Musculoskeletal: symmetric strength and muscle tone throughout. Neurologic: Cranial nerves II through XII are grossly intact. No obvious focalities. Speech is fluent. Coordination is intact. Psychiatric: Judgment and insight are intact. Affect is appropriate. Breasts: Bilateral postoperative bruising of the breasts. The scars are healing well.   ECOG = 1  0 - Asymptomatic (Fully active, able to carry on all predisease activities without restriction)  1 - Symptomatic but completely ambulatory (Restricted in physically strenuous activity but ambulatory and able to carry out work of a light or sedentary nature. For example, light housework, office work)  2 - Symptomatic, <50% in bed during the day (Ambulatory and capable of all self care but unable to carry out any work activities. Up and about more than 50% of waking hours)  3 - Symptomatic, >50% in bed, but not bedbound (Capable of only limited self-care, confined to bed or chair 50% or more of waking hours)  4 - Bedbound (Completely disabled. Cannot carry on any self-care. Totally confined to bed or chair)  5 - Death   Eustace Pen MM, Creech RH, Tormey DC, et al. [phone]).  "Toxicity and response criteria of the [facility]". [place] Oncol. 5 (6): 649-55   LABORATORY DATA:  Lab Results  Component Value Date   WBC 6.7 05/03/2017   HGB 14.3 05/03/2017   HCT 43.9 05/03/2017   MCV 93.8 05/03/2017   PLT 233 05/03/2017   CMP     Component Value Date/Time   NA 140 05/03/2017 1506   K 4.2 05/03/2017 1506   CL 105 05/03/2017 1506   CO2 28 05/03/2017 1506   GLUCOSE 88 05/03/2017 1506   BUN 24 (H) 05/03/2017 1506   CREATININE 0.74 05/03/2017 1506   CALCIUM 10.0 05/03/2017 1506   PROT 7.1 05/03/2017 1506   ALBUMIN 4.2 05/03/2017 1506   AST 24 05/03/2017 1506   ALT 25 05/03/2017 1506   ALKPHOS 63 05/03/2017 1506   BILITOT 1.0 05/03/2017 1506   GFRNONAA >60 05/03/2017 1506   GFRAA >60 05/03/2017 1506         RADIOGRAPHY: Nm Sentinel Node Inj-no Rpt (breast)  Result Date: 05/09/2017 Sulfur colloid was injected by the nuclear medicine technologist for melanoma sentinel node.   Mm Breast Surgical Specimen  Result Date: 05/09/2017 CLINICAL DATA:  Evaluate surgical specimen following lumpectomy for right breast cancer.  EXAM: SPECIMEN RADIOGRAPH OF THE RIGHT BREAST COMPARISON:  Previous exam(s). FINDINGS: Status post excision of the right breast. The radioactive seed and biopsy marker clip are present, completely intact, and were marked for pathology. IMPRESSION: Specimen radiograph of the right breast. Electronically Signed   By: Margarette Canada M.D.   On: 05/09/2017 10:13   Mm Breast Surgical Specimen  Result Date: 05/09/2017 CLINICAL DATA:  Evaluate surgical specimen following excision of left breast papilloma. EXAM: SPECIMEN RADIOGRAPH OF THE LEFT BREAST COMPARISON:  Previous exam(s). FINDINGS: Status post excision of the left breast. The radioactive seed and biopsy marker clip are present, completely intact, and were marked for pathology. IMPRESSION: Specimen radiograph of the left breast. Electronically Signed   By: Margarette Canada  M.D.   On: 05/09/2017 09:48   Mm Lt Radioactive Seed Loc Mammo Guide  Result Date: 05/05/2017 CLINICAL DATA:  Patient presents for radioactive seed localization of a left breast papillary lesion. EXAM: MAMMOGRAPHIC GUIDED RADIOACTIVE SEED LOCALIZATION OF THE LEFT BREAST COMPARISON:  Previous exam(s). FINDINGS: Patient presents for radioactive seed localization prior to surgical excision. I met with the patient and we discussed the procedure of seed localization including benefits and alternatives. We discussed the high likelihood of a successful procedure. We discussed the risks of the procedure including infection, bleeding, tissue injury and further surgery. We discussed the low dose of radioactivity involved in the procedure. Informed, written consent was given. The usual time-out protocol was performed immediately prior to the procedure. Using mammographic guidance, sterile technique, 1% lidocaine and an I-125 radioactive seed, the coil shaped biopsy clip was localized using a superior approach. The follow-up mammogram images confirm the seed in the expected location and were marked for Dr. Brantley Stage. Follow-up survey of the patient confirms presence of the radioactive seed. Order number of I-125 seed:  [id number]. Total activity:  8.101 millicuries  Reference Date: 04/20/2017 The patient tolerated the procedure well and was released from the [facility]. She was given instructions regarding seed removal. IMPRESSION: Radioactive seed localization of the left breast. No apparent complications. Electronically Signed   By: Lajean Manes M.D.   On: 05/05/2017 13:51   Mm Rt Radioactive Seed Loc Mammo Guide  Result Date: 05/05/2017 CLINICAL DATA:  Patient presents for radioactive seed localization of a right breast carcinoma. EXAM: MAMMOGRAPHIC GUIDED RADIOACTIVE SEED LOCALIZATION OF THE RIGHT BREAST COMPARISON:  Previous exam(s). FINDINGS: Patient presents for radioactive seed localization prior to surgical  excision. I met with the patient and we discussed the procedure of seed localization including benefits and alternatives. We discussed the high likelihood of a successful procedure. We discussed the risks of the procedure including infection, bleeding, tissue injury and further surgery. We discussed the low dose of radioactivity involved in the procedure. Informed, written consent was given. The usual time-out protocol was performed immediately prior to the procedure. Using mammographic guidance, sterile technique, 1% lidocaine and an I-125 radioactive seed, the ribbon shaped biopsy clip was localized using a superior approach. The follow-up mammogram images confirm the seed in the expected location and were marked for Dr. Brantley Stage. Follow-up survey of the patient confirms presence of the radioactive seed. Order number of I-125 seed:  [id number]. Total activity:  7.782 millicuries  Reference Date: 04/21/2017 The patient tolerated the procedure well and was released from the [facility]. She was given instructions regarding seed removal. IMPRESSION: Radioactive seed localization of the right breast. No apparent complications. Electronically Signed   By: Lajean Manes M.D.   On: 05/05/2017  13:53      IMPRESSION/PLAN:  Right breast cancer, ER+ stage I.  She understands she has a good prognosis.  Today, I spoke with the patient about the adjuvant options of radiation, antiestrogen, or use of both treatments. We discussed the data from [place], pertaining to her type of breast cancer. After discussing these options in depth, she is interested in taking the antiestrogen pill alone.  I will schedule no further follow ups for her with radiation oncology. She will see Dr. Lindi Adie later today. She has my contact information in case she changes her mind.  She is aware of her high BP and I also informed her of a heart murmur today. She knows to discuss this with her PCP.     __________________________________________   Eppie Gibson, MD   This document serves as a record of services personally performed by Eppie Gibson, MD. It was created on his behalf by Linward Natal, a trained medical scribe. The creation of this record is based on the scribe's personal observations and the provider's statements to them. This document has been checked and approved by the attending provider.

## 2017-05-23 NOTE — Telephone Encounter (Signed)
Gave patient avs and calendar with appts per 11/13 los.

## 2017-06-05 DIAGNOSIS — Z170 Estrogen receptor positive status [ER+]: Secondary | ICD-10-CM | POA: Diagnosis not present

## 2017-06-05 DIAGNOSIS — C50411 Malignant neoplasm of upper-outer quadrant of right female breast: Secondary | ICD-10-CM | POA: Diagnosis not present

## 2017-06-05 DIAGNOSIS — I10 Essential (primary) hypertension: Secondary | ICD-10-CM | POA: Diagnosis not present

## 2017-06-05 DIAGNOSIS — R8290 Unspecified abnormal findings in urine: Secondary | ICD-10-CM | POA: Diagnosis not present

## 2017-06-05 DIAGNOSIS — K219 Gastro-esophageal reflux disease without esophagitis: Secondary | ICD-10-CM | POA: Diagnosis not present

## 2017-06-07 DIAGNOSIS — M11262 Other chondrocalcinosis, left knee: Secondary | ICD-10-CM | POA: Diagnosis not present

## 2017-08-09 DIAGNOSIS — H16223 Keratoconjunctivitis sicca, not specified as Sjogren's, bilateral: Secondary | ICD-10-CM | POA: Diagnosis not present

## 2017-08-09 DIAGNOSIS — Z79899 Other long term (current) drug therapy: Secondary | ICD-10-CM | POA: Diagnosis not present

## 2017-08-09 DIAGNOSIS — H401132 Primary open-angle glaucoma, bilateral, moderate stage: Secondary | ICD-10-CM | POA: Diagnosis not present

## 2017-08-31 DIAGNOSIS — H409 Unspecified glaucoma: Secondary | ICD-10-CM | POA: Diagnosis not present

## 2017-08-31 DIAGNOSIS — Z170 Estrogen receptor positive status [ER+]: Secondary | ICD-10-CM | POA: Diagnosis not present

## 2017-08-31 DIAGNOSIS — M85852 Other specified disorders of bone density and structure, left thigh: Secondary | ICD-10-CM | POA: Diagnosis not present

## 2017-08-31 DIAGNOSIS — E785 Hyperlipidemia, unspecified: Secondary | ICD-10-CM | POA: Diagnosis not present

## 2017-08-31 DIAGNOSIS — M069 Rheumatoid arthritis, unspecified: Secondary | ICD-10-CM | POA: Diagnosis not present

## 2017-08-31 DIAGNOSIS — G479 Sleep disorder, unspecified: Secondary | ICD-10-CM | POA: Diagnosis not present

## 2017-08-31 DIAGNOSIS — C50411 Malignant neoplasm of upper-outer quadrant of right female breast: Secondary | ICD-10-CM | POA: Diagnosis not present

## 2017-08-31 DIAGNOSIS — I10 Essential (primary) hypertension: Secondary | ICD-10-CM | POA: Diagnosis not present

## 2017-08-31 DIAGNOSIS — K219 Gastro-esophageal reflux disease without esophagitis: Secondary | ICD-10-CM | POA: Diagnosis not present

## 2017-08-31 DIAGNOSIS — G5701 Lesion of sciatic nerve, right lower limb: Secondary | ICD-10-CM | POA: Diagnosis not present

## 2017-08-31 DIAGNOSIS — E663 Overweight: Secondary | ICD-10-CM | POA: Diagnosis not present

## 2017-08-31 DIAGNOSIS — R252 Cramp and spasm: Secondary | ICD-10-CM | POA: Diagnosis not present

## 2017-09-25 ENCOUNTER — Telehealth: Payer: Self-pay

## 2017-09-25 ENCOUNTER — Inpatient Hospital Stay: Payer: Medicare Other | Attending: Adult Health | Admitting: Adult Health

## 2017-09-25 ENCOUNTER — Encounter: Payer: Self-pay | Admitting: Adult Health

## 2017-09-25 ENCOUNTER — Telehealth: Payer: Self-pay | Admitting: Hematology and Oncology

## 2017-09-25 VITALS — BP 145/63 | HR 56 | Temp 98.0°F | Resp 17 | Ht 61.0 in | Wt 151.3 lb

## 2017-09-25 DIAGNOSIS — M069 Rheumatoid arthritis, unspecified: Secondary | ICD-10-CM | POA: Diagnosis not present

## 2017-09-25 DIAGNOSIS — Z170 Estrogen receptor positive status [ER+]: Secondary | ICD-10-CM | POA: Diagnosis not present

## 2017-09-25 DIAGNOSIS — C50411 Malignant neoplasm of upper-outer quadrant of right female breast: Secondary | ICD-10-CM | POA: Diagnosis not present

## 2017-09-25 NOTE — Progress Notes (Signed)
CLINIC:  Survivorship   REASON FOR VISIT:  Routine follow-up post-treatment for a recent history of breast cancer.  BRIEF ONCOLOGIC HISTORY:    Malignant neoplasm of upper-outer quadrant of right breast in female, estrogen receptor positive ([place])   04/12/2017 Initial Diagnosis    right breast suspicious mass at 12 o'clock position 1.1 cm; left breast 1 o'clock position 0.6 cm      05/09/2017 Surgery    Right lumpectomy: IDC grade 1, 1.1 cm, low-grade DCIS, margins negative, 0/3 lymph nodes negative ER 100%, PR 100%, HER-2 negative ratio 1.26, Ki-67 1%, T1CN0 stage I a; left lumpectomy: Intraductal papilloma, ADH      05/2017 -  Anti-estrogen oral therapy    Anastrozole daily       INTERVAL HISTORY:  Christina Henderson presents to the [facility] today for our initial meeting to review her survivorship care plan detailing her treatment course for breast cancer, as well as monitoring long-term side effects of that treatment, education regarding health maintenance, screening, and overall wellness and health promotion.     Overall, Christina Henderson reports feeling quite well today.  She is taking Anastrozole daily and tells me that she is tolerating it well.  She has h/o rheumatoid arthritis, and says she feels no different on the Anastrozole.  She denies hot flashes, joint aches and pains, vaginal dryness, or any other concerns.      REVIEW OF SYSTEMS:  Review of Systems  Constitutional: Negative for appetite change, chills, fatigue, fever and unexpected weight change.  HENT:   Negative for hearing loss, lump/mass and trouble swallowing.   Eyes: Negative for eye problems and icterus.  Respiratory: Negative for chest tightness, cough and shortness of breath.   Cardiovascular: Negative for chest pain, leg swelling and palpitations.  Gastrointestinal: Negative for abdominal distention, abdominal pain, constipation, diarrhea, nausea and vomiting.  Endocrine: Negative for hot flashes.    Musculoskeletal: Positive for arthralgias (at baseline).  Skin: Negative for itching and rash.  Neurological: Negative for dizziness, extremity weakness, headaches and numbness.  Hematological: Negative for adenopathy. Does not bruise/bleed easily.  Psychiatric/Behavioral: The patient is not nervous/anxious.    Breast: Denies any new nodularity, masses, tenderness, nipple changes, or nipple discharge.      ONCOLOGY TREATMENT TEAM:  1. Surgeon:  Dr.  Brantley Stage at [facility] 2. Medical Oncologist: Dr. Lindi Adie  3. Radiation Oncologist: Dr. Isidore Moos    PAST MEDICAL/SURGICAL HISTORY:  Past Medical History:  Diagnosis Date  . Arthritis    rhuematoid arthritis  . Cancer ([place]) 03/2017   right breast cancer  . Complication of anesthesia    hard to wake up  . GERD (gastroesophageal reflux disease)    food related  . Glaucoma   . Hypertension   . Osteoporosis    osteopenia  . Rosacea    Past Surgical History:  Procedure Laterality Date  . APPENDECTOMY    . BACK SURGERY    . BREAST LUMPECTOMY WITH RADIOACTIVE SEED AND SENTINEL LYMPH NODE BIOPSY Bilateral 05/09/2017   Procedure: BILATERAL RADIOACTIVE SEED GUIDED LUMPECTOMIES WITH RIGHT SENTINEL LYMPH NODE BIOPSY;  Surgeon: Erroll Luna, MD;  Location: [place];  Service: General;  Laterality: Bilateral;  . EYE SURGERY     cataract  . SPINE SURGERY     lumbar lamenectomy  . TONSILLECTOMY       ALLERGIES:  Allergies  Allergen Reactions  . Caffeine     Triggers rosacea  . Statins Other (See Comments)  Makes her feel stupid  . Sulfa Antibiotics Other (See Comments)    Not sure, childhood allergy     CURRENT MEDICATIONS:  Outpatient Encounter Medications as of 09/25/2017  Medication Sig  . amLODipine (NORVASC) 2.5 MG tablet daily.   Marland Kitchen anastrozole (ARIMIDEX) 1 MG tablet Take 1 tablet (1 mg total) daily by mouth.  . Calcium Carbonate-Vitamin D (CALCIUM 500+D HIGH POTENCY PO) Take by mouth 3  (three) times daily.  . cholecalciferol (VITAMIN D) 1000 units tablet Take 1,000 Units by mouth daily.  Marland Kitchen diltiazem (DILACOR XR) 180 MG 24 hr capsule Take 180 mg by mouth daily.  . hydroxychloroquine (PLAQUENIL) 200 MG tablet Take by mouth daily.  Marland Kitchen losartan (COZAAR) 100 MG tablet Take 100 mg by mouth daily.  . Multiple Vitamin (MULTIVITAMIN WITH MINERALS) TABS tablet Take 1 tablet by mouth daily.  . Probiotic Product (PROBIOTIC ACIDOPHILUS BIOBEADS) CAPS Take by mouth.  Marland Kitchen acetaminophen (TYLENOL) 650 MG CR tablet Take 650 mg every 8 (eight) hours as needed by mouth for pain (Arthrtis Tylenol).    No facility-administered encounter medications on file as of 09/25/2017.      ONCOLOGIC FAMILY HISTORY:  Family History  Problem Relation Age of Onset  . Breast cancer Maternal Aunt      GENETIC COUNSELING/TESTING: Not at this time  SOCIAL HISTORY:  Social History   Socioeconomic History  . Marital status: Widowed    Spouse name: Not on file  . Number of children: Not on file  . Years of education: Not on file  . Highest education level: Not on file  Social Needs  . Financial resource strain: Not on file  . Food insecurity - worry: Not on file  . Food insecurity - inability: Not on file  . Transportation needs - medical: Not on file  . Transportation needs - non-medical: Not on file  Occupational History  . Not on file  Tobacco Use  . Smoking status: Former Smoker    Last attempt to quit: 07/11/1968    Years since quitting: 49.2  . Smokeless tobacco: Never Used  Substance and Sexual Activity  . Alcohol use: Yes    Comment: social  . Drug use: No  . Sexual activity: Not on file  Other Topics Concern  . Not on file  Social History Narrative  . Not on file     PHYSICAL EXAMINATION:  Vital Signs:   Vitals:   09/25/17 1056  BP: (!) 145/63  Pulse: (!) 56  Resp: 17  Temp: 98 F (36.7 C)  SpO2: 96%   Filed Weights   09/25/17 1056  Weight: 151 lb 4.8 oz (68.6 kg)    General: Well-nourished, well-appearing female in no acute distress.  She is unaccompanied today.   HEENT: Head is normocephalic.  Pupils equal and reactive to light. Conjunctivae clear without exudate.  Sclerae anicteric. Oral mucosa is pink, moist.  Oropharynx is pink without lesions or erythema.  Lymph: No cervical, supraclavicular, or infraclavicular lymphadenopathy noted on palpation.  Cardiovascular: Regular rate and rhythm.Marland Kitchen Respiratory: Clear to auscultation bilaterally. Chest expansion symmetric; breathing non-labored.  Breasts: bilateral breasts are s/p lumpectomy, no nodularity or mass noted in either breast GI: Abdomen soft and round; non-tender, non-distended. Bowel sounds normoactive.  GU: Deferred.  Neuro: No focal deficits. Steady gait.  Psych: Mood and affect normal and appropriate for situation.  Extremities: No edema. MSK: No focal spinal tenderness to palpation.  Full range of motion in bilateral upper extremities Skin: Warm and dry.  LABORATORY DATA:  None for this visit.  DIAGNOSTIC IMAGING:  None for this visit.      ASSESSMENT AND PLAN:  Ms.. Henderson is a pleasant 82 y.o. female with Stage IA right breast invasive ductal carcinoma, ER+/PR+/HER2-, diagnosed in 04/2017, treated with lumpectomy,  and anti-estrogen therapy with Anastrozole beginning in 05/2017.  She presents to the Survivorship Clinic for our initial meeting and routine follow-up post-completion of treatment for breast cancer.    1. Stage IA right breast cancer:  Christina Henderson is continuing to recover from definitive treatment for breast cancer. She will follow-up with her medical oncologist, Dr. Lindi Adie in 8 months with history and physical exam per surveillance protocol.  She will continue her anti-estrogen therapy with Anastrozole. Thus far, she is tolerating the Anastrozole well, with minimal side effects. She was instructed to make Dr. Lindi Adie or myself aware if she begins to experience any  worsening side effects of the medication and I could see her back in clinic to help manage those side effects, as needed.  Today, a comprehensive survivorship care plan and treatment summary was reviewed with the patient today detailing her breast cancer diagnosis, treatment course, potential late/long-term effects of treatment, appropriate follow-up care with recommendations for the future, and patient education resources.  A copy of this summary, along with a letter will be sent to the patient's primary care provider via mail/fax/In Basket message after today's visit.    2. Bone health:  Given Christina Henderson's age/history of breast cancer and her current treatment regimen including anti-estrogen therapy with Anastrozole, she is at risk for bone demineralization. Her PCP has done a bone density recently, and she cannot recall the location, or the results, but does recall being recommended to start taking Prolia.  She is currently in the middle of undergoing dental work.  I recommended that she have clearance from her dentist prior to starting Prolia.  I will defer her Prolia and further bone density testing to her PCP.  In the meantime, she was encouraged to increase her consumption of foods rich in calcium, as well as increase her weight-bearing activities.  She was given education on specific activities to promote bone health.  3. Cancer screening:  Due to Christina Henderson's history and her age, she should receive screening for skin cancers, colon cancer, and gynecologic cancers.  The information and recommendations are listed on the patient's comprehensive care plan/treatment summary and were reviewed in detail with the patient.    4. Health maintenance and wellness promotion: Christina Henderson was encouraged to consume 5-7 servings of fruits and vegetables per day. We reviewed the "Nutrition Rainbow" handout, as well as the handout "Take Control of Your Health and Reduce Your Cancer Risk" from the [place].  She was also encouraged to engage in moderate to vigorous exercise for 30 minutes per day most days of the week. We discussed the [employer] fitness program, which is designed for cancer survivors to help them become more physically fit after cancer treatments.  She was instructed to limit her alcohol consumption and continue to abstain from tobacco use.     5. Support services/counseling: It is not uncommon for this period of the patient's cancer care trajectory to be one of many emotions and stressors.  We discussed an opportunity for her to participate in the next session of [facility] ("Finding Your New Normal") support group series designed for patients after they have completed treatment.   Christina Henderson was encouraged to take advantage  of our many other support services programs, support groups, and/or counseling in coping with her new life as a cancer survivor after completing anti-cancer treatment.  She was offered support today through active listening and expressive supportive counseling.  She was given information regarding our available services and encouraged to contact me with any questions or for help enrolling in any of our support group/programs.    Dispo:   -Return to cancer center in November, 2019 for f/u with Dr. Lindi Adie -Mammogram due in 03/2018 -Follow up with Dr. Brantley Stage in May, 2019. -She is welcome to return back to the Survivorship Clinic at any time; no additional follow-up needed at this time.  -Consider referral back to survivorship as a long-term survivor for continued surveillance  A total of (30) minutes of face-to-face time was spent with this patient with greater than 50% of that time in counseling and care-coordination.   Gardenia Phlegm, NP Survivorship Program [facility] [phone]   Note: PRIMARY CARE PROVIDER Cari Caraway, [place] [phone]

## 2017-09-25 NOTE — Telephone Encounter (Signed)
Per 3/18 patient decline avs

## 2017-09-25 NOTE — Telephone Encounter (Signed)
Patient left her breast cancer survivorship plan of care information folder in the room - she had an visit with Mendel Ryder NP today.  Called her on cell number, no answer, left her a VM letting her know info was mailed to.

## 2017-10-12 DIAGNOSIS — Z79899 Other long term (current) drug therapy: Secondary | ICD-10-CM | POA: Diagnosis not present

## 2017-10-12 DIAGNOSIS — M2550 Pain in unspecified joint: Secondary | ICD-10-CM | POA: Diagnosis not present

## 2017-10-12 DIAGNOSIS — M0579 Rheumatoid arthritis with rheumatoid factor of multiple sites without organ or systems involvement: Secondary | ICD-10-CM | POA: Diagnosis not present

## 2017-10-12 DIAGNOSIS — M150 Primary generalized (osteo)arthritis: Secondary | ICD-10-CM | POA: Diagnosis not present

## 2017-11-13 DIAGNOSIS — Z853 Personal history of malignant neoplasm of breast: Secondary | ICD-10-CM | POA: Diagnosis not present

## 2017-12-27 DIAGNOSIS — H43813 Vitreous degeneration, bilateral: Secondary | ICD-10-CM | POA: Diagnosis not present

## 2017-12-27 DIAGNOSIS — H401132 Primary open-angle glaucoma, bilateral, moderate stage: Secondary | ICD-10-CM | POA: Diagnosis not present

## 2017-12-27 DIAGNOSIS — Z79899 Other long term (current) drug therapy: Secondary | ICD-10-CM | POA: Diagnosis not present

## 2018-01-17 DIAGNOSIS — Z853 Personal history of malignant neoplasm of breast: Secondary | ICD-10-CM | POA: Diagnosis not present

## 2018-02-26 DIAGNOSIS — Z0000 Encounter for general adult medical examination without abnormal findings: Secondary | ICD-10-CM | POA: Diagnosis not present

## 2018-02-26 DIAGNOSIS — K219 Gastro-esophageal reflux disease without esophagitis: Secondary | ICD-10-CM | POA: Diagnosis not present

## 2018-02-26 DIAGNOSIS — I10 Essential (primary) hypertension: Secondary | ICD-10-CM | POA: Diagnosis not present

## 2018-02-26 DIAGNOSIS — H409 Unspecified glaucoma: Secondary | ICD-10-CM | POA: Diagnosis not present

## 2018-02-26 DIAGNOSIS — M25562 Pain in left knee: Secondary | ICD-10-CM | POA: Diagnosis not present

## 2018-02-26 DIAGNOSIS — E785 Hyperlipidemia, unspecified: Secondary | ICD-10-CM | POA: Diagnosis not present

## 2018-02-26 DIAGNOSIS — Z1231 Encounter for screening mammogram for malignant neoplasm of breast: Secondary | ICD-10-CM | POA: Diagnosis not present

## 2018-02-26 DIAGNOSIS — R05 Cough: Secondary | ICD-10-CM | POA: Diagnosis not present

## 2018-02-26 DIAGNOSIS — M069 Rheumatoid arthritis, unspecified: Secondary | ICD-10-CM | POA: Diagnosis not present

## 2018-02-26 DIAGNOSIS — E663 Overweight: Secondary | ICD-10-CM | POA: Diagnosis not present

## 2018-02-26 DIAGNOSIS — M85852 Other specified disorders of bone density and structure, left thigh: Secondary | ICD-10-CM | POA: Diagnosis not present

## 2018-03-05 DIAGNOSIS — Z1389 Encounter for screening for other disorder: Secondary | ICD-10-CM | POA: Diagnosis not present

## 2018-03-05 DIAGNOSIS — M069 Rheumatoid arthritis, unspecified: Secondary | ICD-10-CM | POA: Diagnosis not present

## 2018-03-05 DIAGNOSIS — D0511 Intraductal carcinoma in situ of right breast: Secondary | ICD-10-CM | POA: Diagnosis not present

## 2018-03-05 DIAGNOSIS — Z0000 Encounter for general adult medical examination without abnormal findings: Secondary | ICD-10-CM | POA: Diagnosis not present

## 2018-03-05 DIAGNOSIS — K219 Gastro-esophageal reflux disease without esophagitis: Secondary | ICD-10-CM | POA: Diagnosis not present

## 2018-03-05 DIAGNOSIS — I10 Essential (primary) hypertension: Secondary | ICD-10-CM | POA: Diagnosis not present

## 2018-03-05 DIAGNOSIS — E785 Hyperlipidemia, unspecified: Secondary | ICD-10-CM | POA: Diagnosis not present

## 2018-03-05 DIAGNOSIS — M85852 Other specified disorders of bone density and structure, left thigh: Secondary | ICD-10-CM | POA: Diagnosis not present

## 2018-03-05 DIAGNOSIS — H409 Unspecified glaucoma: Secondary | ICD-10-CM | POA: Diagnosis not present

## 2018-03-05 DIAGNOSIS — M25562 Pain in left knee: Secondary | ICD-10-CM | POA: Diagnosis not present

## 2018-03-05 DIAGNOSIS — M1990 Unspecified osteoarthritis, unspecified site: Secondary | ICD-10-CM | POA: Diagnosis not present

## 2018-03-05 DIAGNOSIS — Z170 Estrogen receptor positive status [ER+]: Secondary | ICD-10-CM | POA: Diagnosis not present

## 2018-03-26 ENCOUNTER — Ambulatory Visit
Admission: RE | Admit: 2018-03-26 | Discharge: 2018-03-26 | Disposition: A | Discharge status: Home / Self Care | Payer: Medicare Other | Source: Ambulatory Visit | Attending: Adult Health | Admitting: Adult Health

## 2018-03-26 DIAGNOSIS — Z170 Estrogen receptor positive status [ER+]: Secondary | ICD-10-CM

## 2018-03-26 DIAGNOSIS — M778 Other enthesopathies, not elsewhere classified: Secondary | ICD-10-CM | POA: Diagnosis not present

## 2018-03-26 DIAGNOSIS — C50411 Malignant neoplasm of upper-outer quadrant of right female breast: Secondary | ICD-10-CM

## 2018-03-26 DIAGNOSIS — R928 Other abnormal and inconclusive findings on diagnostic imaging of breast: Secondary | ICD-10-CM | POA: Diagnosis not present

## 2018-03-26 DIAGNOSIS — Z853 Personal history of malignant neoplasm of breast: Secondary | ICD-10-CM | POA: Diagnosis not present

## 2018-03-26 HISTORY — DX: Malignant neoplasm of unspecified site of unspecified female breast: C50.919

## 2018-04-13 DIAGNOSIS — M545 Low back pain: Secondary | ICD-10-CM | POA: Diagnosis not present

## 2018-04-13 DIAGNOSIS — M0579 Rheumatoid arthritis with rheumatoid factor of multiple sites without organ or systems involvement: Secondary | ICD-10-CM | POA: Diagnosis not present

## 2018-04-13 DIAGNOSIS — M2550 Pain in unspecified joint: Secondary | ICD-10-CM | POA: Diagnosis not present

## 2018-04-13 DIAGNOSIS — M654 Radial styloid tenosynovitis [de Quervain]: Secondary | ICD-10-CM | POA: Diagnosis not present

## 2018-04-13 DIAGNOSIS — Z79899 Other long term (current) drug therapy: Secondary | ICD-10-CM | POA: Diagnosis not present

## 2018-04-13 DIAGNOSIS — M150 Primary generalized (osteo)arthritis: Secondary | ICD-10-CM | POA: Diagnosis not present

## 2018-04-13 DIAGNOSIS — Z6827 Body mass index (BMI) 27.0-27.9, adult: Secondary | ICD-10-CM | POA: Diagnosis not present

## 2018-04-13 DIAGNOSIS — E663 Overweight: Secondary | ICD-10-CM | POA: Diagnosis not present

## 2018-04-19 DIAGNOSIS — Z23 Encounter for immunization: Secondary | ICD-10-CM | POA: Diagnosis not present

## 2018-05-07 ENCOUNTER — Encounter: Payer: Self-pay | Admitting: Cardiology

## 2018-05-07 DIAGNOSIS — R05 Cough: Secondary | ICD-10-CM | POA: Diagnosis not present

## 2018-05-07 DIAGNOSIS — R079 Chest pain, unspecified: Secondary | ICD-10-CM | POA: Diagnosis not present

## 2018-05-14 DIAGNOSIS — S63641A Sprain of metacarpophalangeal joint of right thumb, initial encounter: Secondary | ICD-10-CM | POA: Diagnosis not present

## 2018-05-14 NOTE — Progress Notes (Signed)
Cardiology Office Note   Date:  05/16/2018   ID:  Christina Henderson, DOB 1936-02-23, MRN [id number]  PCP:  Cari Caraway, MD  Cardiologist:   No primary care provider on file. Referring:  Cari Caraway, MD   Chief Complaint  Patient presents with  . Chest Pain     History of Present Illness: Christina Henderson is a 82 y.o. female who presents for evaluation of chest pain.  She has no prior cardiac history other than a nuclear stress test many years ago.  She says a few weeks ago she had some chest discomfort.  She had some coughing and it was a deep kind of "strangling" cough.  She lost her breath.  She had some discomfort around this.  She is had a slight cough otherwise but it is not been that severe.  She was worried about the chest discomfort.  She does have long-standing high blood pressure.  However, she is active.  She swims for 45 minutes every morning.  With this she has not been able to bring on any chest discomfort, neck or arm discomfort.  She does not have palpitations, presyncope or syncope.  She has no shortness of breath, PND or orthopnea.  Of note when she saw her primary care doctor she had a normal troponin and no acute EKG changes.   Past Medical History:  Diagnosis Date  . Arthritis    rhuematoid arthritis  . Breast cancer ([place])   . Cancer ([place]) 03/2017   right breast cancer  . Complication of anesthesia    hard to wake up  . GERD (gastroesophageal reflux disease)    food related  . Glaucoma   . Hypertension   . Osteoporosis    osteopenia  . Rosacea     Past Surgical History:  Procedure Laterality Date  . APPENDECTOMY    . BACK SURGERY    . BREAST BIOPSY    . BREAST LUMPECTOMY Right 04/2017  . BREAST LUMPECTOMY WITH RADIOACTIVE SEED AND SENTINEL LYMPH NODE BIOPSY Bilateral 05/09/2017   Procedure: BILATERAL RADIOACTIVE SEED GUIDED LUMPECTOMIES WITH RIGHT SENTINEL LYMPH NODE BIOPSY;  Surgeon: Erroll Luna, MD;  Location: [place];   Service: General;  Laterality: Bilateral;  . EYE SURGERY     cataract  . SPINE SURGERY     lumbar lamenectomy  . TONSILLECTOMY       Current Outpatient Medications  Medication Sig Dispense Refill  . anastrozole (ARIMIDEX) 1 MG tablet Take 1 tablet (1 mg total) daily by mouth. 90 tablet 3  . Calcium Carbonate-Vitamin D (CALCIUM 500+D HIGH POTENCY PO) Take by mouth 3 (three) times daily.    . cholecalciferol (VITAMIN D) 1000 units tablet Take 1,000 Units by mouth daily.    Marland Kitchen diltiazem (DILACOR XR) 240 MG 24 hr capsule Take 240 mg by mouth daily.     . hydroxychloroquine (PLAQUENIL) 200 MG tablet Take 200 mg by mouth 2 (two) times daily.     Marland Kitchen losartan (COZAAR) 100 MG tablet Take 100 mg by mouth daily.    . Multiple Vitamin (MULTIVITAMIN WITH MINERALS) TABS tablet Take 1 tablet by mouth daily.    . Probiotic Product (PROBIOTIC ACIDOPHILUS BIOBEADS) CAPS Take by mouth.     No current facility-administered medications for this visit.     Allergies:   Caffeine; Statins; and Sulfa antibiotics    Social History:  The patient  reports that she quit smoking about 49 years ago. She has never used smokeless  tobacco. She reports that she drinks alcohol. She reports that she does not use drugs.   Family History:  The patient's family history includes Breast cancer in her maternal aunt; Heart attack (age of onset: 26) in her father; Heart failure in her mother.    ROS:  Please see the history of present illness.   Otherwise, review of systems are positive for none.   All other systems are reviewed and negative.    PHYSICAL EXAM: VS:  BP (!) 210/90   Pulse (!) 54   Ht 5' 1.5" (1.562 m)   Wt 150 lb 12.8 oz (68.4 kg)   BMI 28.03 kg/m  , BMI Body mass index is 28.03 kg/m. GENERAL:  Well appearing HEENT:  Pupils equal round and reactive, fundi not visualized, oral mucosa unremarkable NECK:  No jugular venous distention, waveform within normal limits, carotid upstroke brisk and symmetric, no  bruits, no thyromegaly LYMPHATICS:  No cervical, inguinal adenopathy LUNGS:  Clear to auscultation bilaterally BACK:  No CVA tenderness CHEST:  Unremarkable HEART:  PMI not displaced or sustained,S1 and S2 within normal limits, no S3, no S4, no clicks, no rubs, no murmurs ABD:  Flat, positive bowel sounds normal in frequency in pitch, no bruits, no rebound, no guarding, no midline pulsatile mass, no hepatomegaly, no splenomegaly EXT:  2 plus pulses throughout, no edema, no cyanosis no clubbing SKIN:  No rashes no nodules NEURO:  Cranial nerves II through XII grossly intact, motor grossly intact throughout PSYCH:  Cognitively intact, oriented to person place and time    EKG:  EKG is ordered today. The ekg ordered today demonstrates sinus rhythm, rate 54, axis within normal limits, intervals within normal limits, no acute ST-T wave changes.   Recent Labs: No results found for requested labs within last 8760 hours.    Lipid Panel No results found for: CHOL, TRIG, HDL, CHOLHDL, VLDL, LDLCALC, LDLDIRECT    Wt Readings from Last 3 Encounters:  05/16/18 150 lb 12.8 oz (68.4 kg)  09/25/17 151 lb 4.8 oz (68.6 kg)  05/23/17 154 lb 3.2 oz (69.9 kg)      Other studies Reviewed: Additional studies/ records that were reviewed today include: Labs. Review of the above records demonstrates:  Please see elsewhere in the note.     ASSESSMENT AND PLAN:  CHEST PAIN:   Her chest pain is somewhat atypical.  I like to try to walk her on a treadmill.  I would suggest a modified Bruce protocol.  She will be on her calcium channel blocker so it might be difficult to get her heart rate elevated.  I be looking for any high risk features and also watching her blood pressure.  HTN: Her blood pressure is elevated but it came down the line was watching.  She says at home is 170/130 always.  DYSLIPIDEMIA: The LDL is 147.  HDL 52.  In the absence of documented coronary disease I would not suggest management  unless patient prefers.   Current medicines are reviewed at length with the patient today.  The patient does not have concerns regarding medicines.  The following changes have been made:  no change  Labs/ tests ordered today include:   Orders Placed This Encounter  Procedures  . EXERCISE TOLERANCE TEST (ETT)  . EKG 12-Lead     Disposition:   FU with me in two months.     Signed, Minus Breeding, MD  05/16/2018 4:52 PM    [facility]

## 2018-05-15 ENCOUNTER — Encounter: Payer: Self-pay | Admitting: Cardiology

## 2018-05-16 ENCOUNTER — Ambulatory Visit (INDEPENDENT_AMBULATORY_CARE_PROVIDER_SITE_OTHER): Payer: Medicare Other | Admitting: Cardiology

## 2018-05-16 ENCOUNTER — Encounter: Payer: Self-pay | Admitting: Cardiology

## 2018-05-16 VITALS — BP 210/90 | HR 54 | Ht 61.5 in | Wt 150.8 lb

## 2018-05-16 DIAGNOSIS — R079 Chest pain, unspecified: Secondary | ICD-10-CM

## 2018-05-16 DIAGNOSIS — R0602 Shortness of breath: Secondary | ICD-10-CM | POA: Diagnosis not present

## 2018-05-16 NOTE — Patient Instructions (Signed)
Medication Instructions:  Continue current medications  If you need a refill on your cardiac medications before your next appointment, please call your pharmacy.  Labwork: None Ordered   If you have labs (blood work) drawn today and your tests are completely normal, you will receive your results only by: Marland Kitchen MyChart Message (if you have MyChart) OR . A paper copy in the mail If you have any lab test that is abnormal or we need to change your treatment, we will call you to review the results.  Testing/Procedures: Your physician has requested that you have an exercise tolerance test. For further information please visit [email or web address]. Please also follow instruction sheet, as given.  Follow-Up: . You will need a follow up appointment in 2 Months.   At [facility], you and your health needs are our priority.  As part of our continuing mission to provide you with exceptional heart care, we have created designated Provider Care Teams.  These Care Teams include your primary Cardiologist (physician) and Advanced Practice Providers (APPs -  Physician Assistants and Nurse Practitioners) who all work together to provide you with the care you need, when you need it.    Thank you for choosing [facility] at [facility]!!

## 2018-05-22 ENCOUNTER — Telehealth (HOSPITAL_COMMUNITY): Payer: Self-pay

## 2018-05-22 NOTE — Telephone Encounter (Signed)
Encounter complete. 

## 2018-05-24 ENCOUNTER — Inpatient Hospital Stay: Payer: Medicare Other | Attending: Hematology and Oncology | Admitting: Hematology and Oncology

## 2018-05-24 ENCOUNTER — Telehealth: Payer: Self-pay | Admitting: Hematology and Oncology

## 2018-05-24 ENCOUNTER — Ambulatory Visit (HOSPITAL_COMMUNITY)
Admission: RE | Admit: 2018-05-24 | Discharge: 2018-05-24 | Disposition: A | Discharge status: Home / Self Care | Payer: Medicare Other | Source: Ambulatory Visit | Attending: Cardiology | Admitting: Cardiology

## 2018-05-24 DIAGNOSIS — M069 Rheumatoid arthritis, unspecified: Secondary | ICD-10-CM

## 2018-05-24 DIAGNOSIS — R079 Chest pain, unspecified: Secondary | ICD-10-CM

## 2018-05-24 DIAGNOSIS — R0602 Shortness of breath: Secondary | ICD-10-CM

## 2018-05-24 DIAGNOSIS — Z79899 Other long term (current) drug therapy: Secondary | ICD-10-CM | POA: Insufficient documentation

## 2018-05-24 DIAGNOSIS — Z170 Estrogen receptor positive status [ER+]: Secondary | ICD-10-CM | POA: Diagnosis not present

## 2018-05-24 DIAGNOSIS — C50411 Malignant neoplasm of upper-outer quadrant of right female breast: Secondary | ICD-10-CM

## 2018-05-24 LAB — EXERCISE TOLERANCE TEST
CSEPED: 6 min
CSEPEDS: 7 s
CSEPEW: 7.1 METS
CSEPHR: 69 %
CSEPPHR: 97 {beats}/min
MPHR: 140 {beats}/min
RPE: 19
Rest HR: 56 {beats}/min

## 2018-05-24 MED ORDER — ANASTROZOLE 1 MG PO TABS: 1.0000 mg | ORAL_TABLET | Freq: Every day | ORAL | 3 refills | Status: DC

## 2018-05-24 NOTE — Telephone Encounter (Signed)
Printed calendar and avs. °

## 2018-05-24 NOTE — Assessment & Plan Note (Signed)
05/09/2017: Right lumpectomy: IDC grade 1, 1.1 cm, low-grade DCIS, margins negative, 0/3 lymph nodes negative ER 100%, PR 100%, HER-2 negative ratio 1.26, Ki-67 1%, T1CN0 stage I a; left lumpectomy: Intraductal papilloma, ADH  Current treatment: Anastrozole 1 mg p.o. daily started 05/23/2017  Anastrozole toxicities:  Breast cancer surveillance: 1.  Mammogram 03/26/2018: Benign breast density category B 2.  Breast exam 2019: Benign  Return to clinic in 1 year for follow-up

## 2018-05-24 NOTE — Progress Notes (Signed)
Patient Care Team: Cari Caraway, MD as PCP - General (Family Medicine) Nicholas Lose, MD as Consulting Physician (Hematology and Oncology) Delice Bison Charlestine Massed, NP as Nurse Practitioner (Hematology and Oncology) Erroll Luna, MD as Consulting Physician (General Surgery) Eppie Gibson, MD as Attending Physician (Radiation Oncology)  DIAGNOSIS:  Encounter Diagnosis  Name Primary?  . Malignant neoplasm of upper-outer quadrant of right breast in female, estrogen receptor positive ([place])     SUMMARY OF ONCOLOGIC HISTORY:   Malignant neoplasm of upper-outer quadrant of right breast in female, estrogen receptor positive ([place])   04/12/2017 Initial Diagnosis    right breast suspicious mass at 12 o'clock position 1.1 cm; left breast 1 o'clock position 0.6 cm    05/09/2017 Surgery    Right lumpectomy: IDC grade 1, 1.1 cm, low-grade DCIS, margins negative, 0/3 lymph nodes negative ER 100%, PR 100%, HER-2 negative ratio 1.26, Ki-67 1%, T1CN0 stage I a; left lumpectomy: Intraductal papilloma, ADH    05/2017 -  Anti-estrogen oral therapy    Anastrozole daily     CHIEF COMPLIANT: Follow-up on anastrozole therapy  INTERVAL HISTORY: Christina Henderson is a 82 year old with above-mentioned history of right breast cancer treated with lumpectomy and is currently on antiestrogen therapy with anastrozole.  She is tolerating anastrozole extremely well.  She has had no hot flashes.  She has on rheumatoid arthritis and has some joint aches and pains but has not noticed any worsening symptoms.  She in fact exercises 45 minutes by swimming in the swimming pool every day.  REVIEW OF SYSTEMS:   Constitutional: Denies fevers, chills or abnormal weight loss Eyes: Denies blurriness of vision Ears, nose, mouth, throat, and face: Denies mucositis or sore throat Respiratory: Denies cough, dyspnea or wheezes Cardiovascular: Denies palpitation, chest discomfort Gastrointestinal:  Denies nausea, heartburn or  change in bowel habits Skin: Denies abnormal skin rashes Lymphatics: Denies new lymphadenopathy or easy bruising Neurological:Denies numbness, tingling or new weaknesses Behavioral/Psych: Mood is stable, no new changes  Extremities: No lower extremity edema Breast:  denies any pain or lumps or nodules in either breasts: Mild discomfort in the left lateral aspect of the breast All other systems were reviewed with the patient and are negative.  I have reviewed the past medical history, past surgical history, social history and family history with the patient and they are unchanged from previous note.  ALLERGIES:  is allergic to caffeine; statins; and sulfa antibiotics.  MEDICATIONS:  Current Outpatient Medications  Medication Sig Dispense Refill  . anastrozole (ARIMIDEX) 1 MG tablet Take 1 tablet (1 mg total) daily by mouth. 90 tablet 3  . Calcium Carbonate-Vitamin D (CALCIUM 500+D HIGH POTENCY PO) Take by mouth 3 (three) times daily.    . cholecalciferol (VITAMIN D) 1000 units tablet Take 1,000 Units by mouth daily.    Marland Kitchen diltiazem (DILACOR XR) 240 MG 24 hr capsule Take 240 mg by mouth daily.     . hydroxychloroquine (PLAQUENIL) 200 MG tablet Take 200 mg by mouth 2 (two) times daily.     Marland Kitchen losartan (COZAAR) 100 MG tablet Take 100 mg by mouth daily.    . Multiple Vitamin (MULTIVITAMIN WITH MINERALS) TABS tablet Take 1 tablet by mouth daily.    . Probiotic Product (PROBIOTIC ACIDOPHILUS BIOBEADS) CAPS Take by mouth.     No current facility-administered medications for this visit.     PHYSICAL EXAMINATION: ECOG PERFORMANCE STATUS: 1 - Symptomatic but completely ambulatory  Vitals:   05/24/18 1016  BP: (!) 158/63  Pulse: Marland Kitchen)  53  Resp: 17  Temp: 98.2 F (36.8 C)  SpO2: 99%   Filed Weights   05/24/18 1016  Weight: 149 lb 11.2 oz (67.9 kg)    GENERAL:alert, no distress and comfortable SKIN: skin color, texture, turgor are normal, no rashes or significant lesions EYES: normal,  Conjunctiva are pink and non-injected, sclera clear OROPHARYNX:no exudate, no erythema and lips, buccal mucosa, and tongue normal  NECK: supple, thyroid normal size, non-tender, without nodularity LYMPH:  no palpable lymphadenopathy in the cervical, axillary or inguinal LUNGS: clear to auscultation and percussion with normal breathing effort HEART: regular rate & rhythm and no murmurs and no lower extremity edema ABDOMEN:abdomen soft, non-tender and normal bowel sounds MUSCULOSKELETAL:no cyanosis of digits and no clubbing  NEURO: alert & oriented x 3 with fluent speech, no focal motor/sensory deficits EXTREMITIES: No lower extremity edema BREAST: No palpable masses or nodules in either right or left breasts. No palpable axillary supraclavicular or infraclavicular adenopathy no breast tenderness or nipple discharge. (exam performed in the presence of a chaperone)  LABORATORY DATA:  I have reviewed the data as listed CMP Latest Ref Rng & Units 05/03/2017  Glucose 65 - 99 mg/dL 88  BUN 6 - 20 mg/dL 24(H)  Creatinine 0.44 - 1.00 mg/dL 0.74  Sodium 135 - 145 mmol/L 140  Potassium 3.5 - 5.1 mmol/L 4.2  Chloride 101 - 111 mmol/L 105  CO2 22 - 32 mmol/L 28  Calcium 8.9 - 10.3 mg/dL 10.0  Total Protein 6.5 - 8.1 g/dL 7.1  Total Bilirubin 0.3 - 1.2 mg/dL 1.0  Alkaline Phos 38 - 126 U/L 63  AST 15 - 41 U/L 24  ALT 14 - 54 U/L 25    Lab Results  Component Value Date   WBC 6.7 05/03/2017   HGB 14.3 05/03/2017   HCT 43.9 05/03/2017   MCV 93.8 05/03/2017   PLT 233 05/03/2017   NEUTROABS 4.1 05/03/2017    ASSESSMENT & PLAN:  Malignant neoplasm of upper-outer quadrant of right breast in female, estrogen receptor positive ([place]) 05/09/2017: Right lumpectomy: IDC grade 1, 1.1 cm, low-grade DCIS, margins negative, 0/3 lymph nodes negative ER 100%, PR 100%, HER-2 negative ratio 1.26, Ki-67 1%, T1CN0 stage I a; left lumpectomy: Intraductal papilloma, ADH  Current treatment: Anastrozole 1 mg  p.o. daily started 05/23/2017  Anastrozole toxicities: Denies any side effects.  She has rheumatoid arthritis which causes her some joint aches and stiffness.  Breast cancer surveillance: 1.  Mammogram 03/26/2018: Benign breast density category B 2.  Breast exam 2019: Benign  Return to clinic in 1 year for follow-up    No orders of the defined types were placed in this encounter.  The patient has a good understanding of the overall plan. she agrees with it. she will call with any problems that may develop before the next visit here.   Harriette Ohara, MD 05/24/18

## 2018-05-30 ENCOUNTER — Telehealth: Payer: Self-pay | Admitting: Cardiology

## 2018-05-30 NOTE — Telephone Encounter (Signed)
New Message:     Pt said she received a letter telling her to check My-Chart. She says she does not have a My-Chart Acct. She thinks it might be her Stress Test results.

## 2018-05-30 NOTE — Telephone Encounter (Signed)
Advised patient, verbalized understanding. Sent to PCP   Notes recorded by Minus Breeding, MD on 05/27/2018 at 11:50 AM EST There wwas no evidence of ischemia on the POET (Plain Old Exercise Treadmill) although it was a submaximal test. No further testing is planned at this time. Call Ms. Tessa Lerner with the results and send results to Cari Caraway, MD

## 2018-06-26 DIAGNOSIS — H401132 Primary open-angle glaucoma, bilateral, moderate stage: Secondary | ICD-10-CM | POA: Diagnosis not present

## 2018-06-26 DIAGNOSIS — Z79899 Other long term (current) drug therapy: Secondary | ICD-10-CM | POA: Diagnosis not present

## 2018-06-26 DIAGNOSIS — H35033 Hypertensive retinopathy, bilateral: Secondary | ICD-10-CM | POA: Diagnosis not present

## 2018-06-26 DIAGNOSIS — H43813 Vitreous degeneration, bilateral: Secondary | ICD-10-CM | POA: Diagnosis not present

## 2018-07-15 NOTE — Progress Notes (Signed)
Cardiology Office Note   Date:  07/17/2018   ID:  Christina Henderson, DOB [birth year], MRN [id number]  PCP:  Cari Caraway, MD  Cardiologist:   No primary care provider on file. Referring:  Cari Caraway, MD   Chief Complaint  Patient presents with  . Chest Pain     History of Present Illness: Christina Henderson is a 83 y.o. female who presents for evaluation of chest pain.  I saw her in Nov for this and sent her for a POET (Plain Old Exercise Treadmill).  This was negative for ischemia.  She was able to go for over 6 minutes and she had no chest pain.  She had a bit of a hypertensive blood pressure response.  She has not had any of the chest discomfort that she had.  She is still having a cough that was described in the previous note.  Her blood pressures actually been elevated recently and consistently kind of going up throughout the day.  A peak yesterday was 169/70.  She still able to swim routinely.   Past Medical History:  Diagnosis Date  . Arthritis    rhuematoid arthritis  . Breast cancer ([place])   . Cancer ([place]) 03/2017   right breast cancer  . Complication of anesthesia    hard to wake up  . GERD (gastroesophageal reflux disease)    food related  . Glaucoma   . Hypertension   . Osteoporosis    osteopenia  . Rosacea     Past Surgical History:  Procedure Laterality Date  . APPENDECTOMY    . BACK SURGERY    . BREAST BIOPSY    . BREAST LUMPECTOMY Right 04/2017  . BREAST LUMPECTOMY WITH RADIOACTIVE SEED AND SENTINEL LYMPH NODE BIOPSY Bilateral 05/09/2017   Procedure: BILATERAL RADIOACTIVE SEED GUIDED LUMPECTOMIES WITH RIGHT SENTINEL LYMPH NODE BIOPSY;  Surgeon: Erroll Luna, MD;  Location: [facility];  Service: General;  Laterality: Bilateral;  . EYE SURGERY     cataract  . SPINE SURGERY     lumbar lamenectomy  . TONSILLECTOMY       Current Outpatient Medications  Medication Sig Dispense Refill  . anastrozole (ARIMIDEX) 1 MG tablet Take 1 tablet  (1 mg total) by mouth daily. 90 tablet 3  . Calcium Carbonate-Vitamin D (CALCIUM 500+D HIGH POTENCY PO) Take by mouth 3 (three) times daily.    . cholecalciferol (VITAMIN D) 1000 units tablet Take 1,000 Units by mouth daily.    Marland Kitchen diltiazem (DILACOR XR) 240 MG 24 hr capsule Take 240 mg by mouth daily.     . hydroxychloroquine (PLAQUENIL) 200 MG tablet Take 200 mg by mouth 2 (two) times daily.     Marland Kitchen losartan (COZAAR) 100 MG tablet Take 100 mg by mouth daily.    . Multiple Vitamin (MULTIVITAMIN WITH MINERALS) TABS tablet Take 1 tablet by mouth daily.    . Probiotic Product (PROBIOTIC ACIDOPHILUS BIOBEADS) CAPS Take by mouth.    Marland Kitchen omeprazole (PRILOSEC) 20 MG capsule Take 1 capsule (20 mg total) by mouth daily. 30 capsule 11   No current facility-administered medications for this visit.     Allergies:   Caffeine; Statins; and Sulfa antibiotics    ROS:  Please see the history of present illness.   Otherwise, review of systems are positive for occasional tingling in her fingers.   All other systems are reviewed and negative.    PHYSICAL EXAM: VS:  BP (!) 162/74   Pulse (!) 57  Ht 5' 1.5" (1.562 m)   Wt 153 lb 9.6 oz (69.7 kg)   SpO2 95%   BMI 28.55 kg/m  , BMI Body mass index is 28.55 kg/m. GENERAL:  Well appearing NECK:  No jugular venous distention, waveform within normal limits, carotid upstroke brisk and symmetric, no bruits, no thyromegaly LUNGS:  Clear to auscultation bilaterally CHEST:  Unremarkable HEART:  PMI not displaced or sustained,S1 and S2 within normal limits, no S3, no S4, no clicks, no rubs, no murmurs ABD:  Flat, positive bowel sounds normal in frequency in pitch, no bruits, no rebound, no guarding, no midline pulsatile mass, no hepatomegaly, no splenomegaly EXT:  2 plus pulses throughout, no edema, no cyanosis no clubbing   EKG:  EKG is note ordered today.    Recent Labs: No results found for requested labs within last 8760 hours.    Lipid Panel No results  found for: CHOL, TRIG, HDL, CHOLHDL, VLDL, LDLCALC, LDLDIRECT    Wt Readings from Last 3 Encounters:  07/17/18 153 lb 9.6 oz (69.7 kg)  05/24/18 149 lb 11.2 oz (67.9 kg)  05/16/18 150 lb 12.8 oz (68.4 kg)      Other studies Reviewed: Additional studies/ records that were reviewed today include: POET (Plain Old Exercise Treadmill). Review of the above records demonstrates: See above   ASSESSMENT AND PLAN:  CHEST PAIN:   He had a negative POET (Plain Old Exercise Treadmill).  She is not had any recurrent pain.  No change in therapy.  No further evaluation.  HTN: Her blood pressure is slightly elevated.  I am going to change her Cozaar to p.m. to see if that smooths out her blood pressure.  If not she will probably need another agent.  She will send me her blood pressure readings and I be happy to review these and make further adjustments.   DYSLIPIDEMIA: The LDL is 147.  HDL 52.  No change in therapy.  In the absence of documented coronary disease we are treating this conservatively.   COUGH: She does have a slight cough.  Possibly this could be related to the losartan.  First I like an empiric treatment with Prilosec to see you she might have some reflux causing this though she is not having other symptoms consistent.  She will take this just for a month and then if she is better she will stop and see if it returns.  If she is no better we might need to hold the Cozaar to see if it is related.  Current medicines are reviewed at length with the patient today.  The patient does not have concerns regarding medicines.  The following changes have been made:  As above.   Labs/ tests ordered today include: None  No orders of the defined types were placed in this encounter.    Disposition:   FU with me in 4 months.     Signed, Minus Breeding, MD  07/17/2018 2:14 PM    [facility]

## 2018-07-17 ENCOUNTER — Encounter: Payer: Self-pay | Admitting: Cardiology

## 2018-07-17 ENCOUNTER — Ambulatory Visit (INDEPENDENT_AMBULATORY_CARE_PROVIDER_SITE_OTHER): Payer: Medicare Other | Admitting: Cardiology

## 2018-07-17 VITALS — BP 162/74 | HR 57 | Ht 61.5 in | Wt 153.6 lb

## 2018-07-17 DIAGNOSIS — R059 Cough, unspecified: Secondary | ICD-10-CM

## 2018-07-17 DIAGNOSIS — R079 Chest pain, unspecified: Secondary | ICD-10-CM | POA: Diagnosis not present

## 2018-07-17 DIAGNOSIS — R05 Cough: Secondary | ICD-10-CM

## 2018-07-17 DIAGNOSIS — I10 Essential (primary) hypertension: Secondary | ICD-10-CM | POA: Diagnosis not present

## 2018-07-17 MED ORDER — OMEPRAZOLE 20 MG PO CPDR: 20.0000 mg | DELAYED_RELEASE_CAPSULE | Freq: Every day | ORAL | 11 refills | Status: DC

## 2018-07-17 NOTE — Patient Instructions (Addendum)
Medication Instructions:  START- Prilosec 20 mg daily for 1 Month  If you need a refill on your cardiac medications before your next appointment, please call your pharmacy.   Lab work: None Ordered  If you have labs (blood work) drawn today and your tests are completely normal, you will receive your results only by: Marland Kitchen MyChart Message (if you have MyChart) OR . A paper copy in the mail If you have any lab test that is abnormal or we need to change your treatment, we will call you to review the results.  Testing/Procedures: None Ordered  Follow-Up: At [facility], you and your health needs are our priority.  As part of our continuing mission to provide you with exceptional heart care, we have created designated Provider Care Teams.  These Care Teams include your primary Cardiologist (physician) and Advanced Practice Providers (APPs -  Physician Assistants and Nurse Practitioners) who all work together to provide you with the care you need, when you need it. . Your physician recommends that you schedule a follow-up appointment in: 4 Months with Dr Percival Spanish

## 2018-09-07 DIAGNOSIS — E785 Hyperlipidemia, unspecified: Secondary | ICD-10-CM | POA: Diagnosis not present

## 2018-09-07 DIAGNOSIS — M85852 Other specified disorders of bone density and structure, left thigh: Secondary | ICD-10-CM | POA: Diagnosis not present

## 2018-09-07 DIAGNOSIS — Z170 Estrogen receptor positive status [ER+]: Secondary | ICD-10-CM | POA: Diagnosis not present

## 2018-09-07 DIAGNOSIS — G479 Sleep disorder, unspecified: Secondary | ICD-10-CM | POA: Diagnosis not present

## 2018-09-07 DIAGNOSIS — M069 Rheumatoid arthritis, unspecified: Secondary | ICD-10-CM | POA: Diagnosis not present

## 2018-09-07 DIAGNOSIS — M1990 Unspecified osteoarthritis, unspecified site: Secondary | ICD-10-CM | POA: Diagnosis not present

## 2018-09-07 DIAGNOSIS — H409 Unspecified glaucoma: Secondary | ICD-10-CM | POA: Diagnosis not present

## 2018-09-07 DIAGNOSIS — K219 Gastro-esophageal reflux disease without esophagitis: Secondary | ICD-10-CM | POA: Diagnosis not present

## 2018-09-07 DIAGNOSIS — I10 Essential (primary) hypertension: Secondary | ICD-10-CM | POA: Diagnosis not present

## 2018-09-07 DIAGNOSIS — Z86010 Personal history of colonic polyps: Secondary | ICD-10-CM | POA: Diagnosis not present

## 2018-09-12 ENCOUNTER — Telehealth: Payer: Self-pay | Admitting: Hematology and Oncology

## 2018-09-12 NOTE — Telephone Encounter (Signed)
Tried to reach regarding voicemail, phone kept ringing

## 2018-09-18 ENCOUNTER — Other Ambulatory Visit: Payer: Self-pay | Admitting: Family Medicine

## 2018-09-18 DIAGNOSIS — N6310 Unspecified lump in the right breast, unspecified quadrant: Secondary | ICD-10-CM

## 2018-09-21 DIAGNOSIS — L308 Other specified dermatitis: Secondary | ICD-10-CM | POA: Diagnosis not present

## 2018-09-21 DIAGNOSIS — L720 Epidermal cyst: Secondary | ICD-10-CM | POA: Diagnosis not present

## 2018-09-24 ENCOUNTER — Ambulatory Visit
Admission: RE | Admit: 2018-09-24 | Discharge: 2018-09-24 | Disposition: A | Discharge status: Home / Self Care | Payer: Medicare Other | Source: Ambulatory Visit | Attending: Family Medicine | Admitting: Family Medicine

## 2018-09-24 ENCOUNTER — Other Ambulatory Visit: Payer: Self-pay

## 2018-09-24 DIAGNOSIS — N6489 Other specified disorders of breast: Secondary | ICD-10-CM | POA: Diagnosis not present

## 2018-09-24 DIAGNOSIS — Z853 Personal history of malignant neoplasm of breast: Secondary | ICD-10-CM | POA: Diagnosis not present

## 2018-09-24 DIAGNOSIS — N6310 Unspecified lump in the right breast, unspecified quadrant: Secondary | ICD-10-CM

## 2018-09-24 DIAGNOSIS — R928 Other abnormal and inconclusive findings on diagnostic imaging of breast: Secondary | ICD-10-CM | POA: Diagnosis not present

## 2018-11-02 DIAGNOSIS — M79672 Pain in left foot: Secondary | ICD-10-CM | POA: Diagnosis not present

## 2018-11-21 ENCOUNTER — Telehealth: Payer: Self-pay | Admitting: Cardiology

## 2018-11-22 NOTE — Progress Notes (Signed)
Virtual Visit via Video Note   This visit type was conducted due to national recommendations for restrictions regarding the COVID-19 Pandemic (e.g. social distancing) in an effort to limit this patient's exposure and mitigate transmission in our community.  Due to her co-morbid illnesses, this patient is at least at moderate risk for complications without adequate follow up.  This format is felt to be most appropriate for this patient at this time.  All issues noted in this document were discussed and addressed.  A limited physical exam was performed with this format.  Please refer to the patient's chart for her consent to telehealth for [facility].   Date:  11/23/2018   ID:  Christina Henderson, DOB [birth year], MRN [id number]  Patient Location: Home Provider Location: Home  PCP:  Cari Caraway, MD  Cardiologist:  Minus Breeding, MD  Electrophysiologist:  None   Evaluation Performed:  Follow-Up Visit  Chief Complaint:  Chest pain  History of Present Illness:    Christina Henderson is a 83 y.o. female with I saw her in Nov for this and sent her for a POET (Plain Old Exercise Treadmill).  This was negative for ischemia.  She was able to go for over 6 minutes and she had no chest pain.  She had a bit of a hypertensive blood pressure response.  She was having a cough recently and took I gave her a short course of Prilosec.    She fee mother with revision of her chest pain and cough.  She thinks he got better on the Prilosec and she ascribed to the food she was eating.  She is unfortunately not able to exercise as much because they have closed the pool and she has an Achilles tear.  However, with her activities she denies any cardiovascular symptoms such as chest pressure, neck or arm discomfort.  At the last visit I did change her Cozaar to nighttime.  Her blood pressures are smooth.  She does report heart rates that are in the 40s and 50s quite frequently and this morning she was a little dizzy.     The patient does not have symptoms concerning for COVID-19 infection (fever, chills, cough, or new shortness of breath).    Past Medical History:  Diagnosis Date  . Arthritis    rhuematoid arthritis  . Breast cancer ([place])   . Cancer ([place]) 03/2017   right breast cancer  . Complication of anesthesia    hard to wake up  . GERD (gastroesophageal reflux disease)    food related  . Glaucoma   . Hypertension   . Osteoporosis    osteopenia  . Rosacea    Past Surgical History:  Procedure Laterality Date  . APPENDECTOMY    . BACK SURGERY    . BREAST BIOPSY    . BREAST LUMPECTOMY Right 04/2017  . BREAST LUMPECTOMY WITH RADIOACTIVE SEED AND SENTINEL LYMPH NODE BIOPSY Bilateral 05/09/2017   Procedure: BILATERAL RADIOACTIVE SEED GUIDED LUMPECTOMIES WITH RIGHT SENTINEL LYMPH NODE BIOPSY;  Surgeon: Erroll Luna, MD;  Location: [place];  Service: General;  Laterality: Bilateral;  . EYE SURGERY     cataract  . SPINE SURGERY     lumbar lamenectomy  . TONSILLECTOMY       Current Meds  Medication Sig  . amLODipine (NORVASC) 2.5 MG tablet Take 1 tablet by mouth daily.  Marland Kitchen anastrozole (ARIMIDEX) 1 MG tablet Take 1 tablet (1 mg total) by mouth daily.  . Calcium Carbonate-Vitamin D (CALCIUM  500+D HIGH POTENCY PO) Take by mouth 3 (three) times daily.  . cholecalciferol (VITAMIN D) 1000 units tablet Take 1,000 Units by mouth daily.  Marland Kitchen diltiazem (DILACOR XR) 240 MG 24 hr capsule Take 240 mg by mouth daily.   . hydroxychloroquine (PLAQUENIL) 200 MG tablet Take 200 mg by mouth 2 (two) times daily.   Marland Kitchen ibandronate (BONIVA) 150 MG tablet Take 1 tablet by mouth every 30 (thirty) days.  Marland Kitchen losartan (COZAAR) 100 MG tablet Take 100 mg by mouth daily.  . Multiple Vitamin (MULTIVITAMIN WITH MINERALS) TABS tablet Take 1 tablet by mouth daily.  . Probiotic Product (PROBIOTIC ACIDOPHILUS BIOBEADS) CAPS Take by mouth daily.      Allergies:   Caffeine; Statins; and Sulfa antibiotics    Social History   Tobacco Use  . Smoking status: Former Smoker    Last attempt to quit: 07/11/1968    Years since quitting: 50.4  . Smokeless tobacco: Never Used  Substance Use Topics  . Alcohol use: Yes    Comment: social  . Drug use: No     Family Hx: The patient's family history includes Breast cancer in her maternal aunt; Heart attack (age of onset: 69) in her father; Heart failure in her mother.  ROS:   Please see the history of present illness.    As stated in the HPI and negative for all other systems.   Prior CV studies:   The following studies were reviewed today:   Labs/Other Tests and Data Reviewed:    EKG:  NSR, rate 54, axis within normal limits, intervals within normal limits, no acute ST-T wave changes  05/17/18  Recent Labs: No results found for requested labs within last 8760 hours.   Recent Lipid Panel No results found for: CHOL, TRIG, HDL, CHOLHDL, LDLCALC, LDLDIRECT  Wt Readings from Last 3 Encounters:  11/23/18 145 lb 9.6 oz (66 kg)  07/17/18 153 lb 9.6 oz (69.7 kg)  05/24/18 149 lb 11.2 oz (67.9 kg)     Objective:    Vital Signs:  BP (!) 122/93   Pulse (!) 43   Ht 5' 1.5" (1.562 m)   Wt 145 lb 9.6 oz (66 kg)   BMI 27.07 kg/m    VITAL SIGNS:  reviewed GEN:  no acute distress EYES:  sclerae anicteric, EOMI - Extraocular Movements Intact RESPIRATORY:  normal respiratory effort, symmetric expansion NEURO:  alert and oriented x 3, no obvious focal deficit PSYCH:  normal affect  ASSESSMENT & PLAN:    CHEST PAIN:    This was likely related to reflux.  No change in therapy.  Evaluation.  HTN: Her blood pressure is  at target.  I am a little bit worried about her bradycardia and she did have a little lightheadedness.  Therefore I am going to reduce her down to 180 mg daily.  She will let me know what her blood pressure reading is.  We reviewed her blood pressure diary and spent 101/60- 126/63  DYSLIPIDEMIA:      She has been medications.  Were  going to manage this conservatively.  COUGH:   This improved with Prilosec and with change in her diet.   COVID-19 Education: The signs and symptoms of COVID-19 were discussed with the patient and how to seek care for testing (follow up with PCP or arrange E-visit).  The importance of social distancing was discussed today.  Time:   Today, I have spent 20 minutes with the patient with telehealth technology discussing the  above problems.     Medication Adjustments/Labs and Tests Ordered: Current medicines are reviewed at length with the patient today.  Concerns regarding medicines are outlined above.   Tests Ordered: No orders of the defined types were placed in this encounter.   Medication Changes: No orders of the defined types were placed in this encounter.   Disposition:  Follow up with me in one year.   Signed, Minus Breeding, MD  11/23/2018 10:55 AM    [facility]

## 2018-11-23 ENCOUNTER — Encounter: Payer: Self-pay | Admitting: Cardiology

## 2018-11-23 ENCOUNTER — Telehealth (INDEPENDENT_AMBULATORY_CARE_PROVIDER_SITE_OTHER): Payer: Medicare Other | Admitting: Cardiology

## 2018-11-23 VITALS — BP 122/93 | HR 43 | Ht 61.5 in | Wt 145.6 lb

## 2018-11-23 DIAGNOSIS — Z7189 Other specified counseling: Secondary | ICD-10-CM

## 2018-11-23 DIAGNOSIS — R059 Cough, unspecified: Secondary | ICD-10-CM

## 2018-11-23 DIAGNOSIS — R079 Chest pain, unspecified: Secondary | ICD-10-CM

## 2018-11-23 DIAGNOSIS — I10 Essential (primary) hypertension: Secondary | ICD-10-CM

## 2018-11-23 DIAGNOSIS — R05 Cough: Secondary | ICD-10-CM

## 2018-11-23 MED ORDER — DILTIAZEM HCL ER COATED BEADS 180 MG PO CP24: 180.0000 mg | ORAL_CAPSULE | Freq: Every day | ORAL | 3 refills | Status: DC

## 2018-11-23 NOTE — Patient Instructions (Signed)
Medication Instructions:  STOP- Diltiazem 240 mg START- Cardizem 180 mg daily  If you need a refill on your cardiac medications before your next appointment, please call your pharmacy.  Labwork: None Ordered   Testing/Procedures: None Ordered  Follow-Up: You will need a follow up appointment in 1 Year.  Please call our office 2 months in advance to schedule this appointment.  You may see Minus Breeding, MD or one of the following Advanced Practice Providers on your designated Care Team:   Rosaria Ferries, PA-C . Jory Sims, DNP, ANP      At [facility], you and your health needs are our priority.  As part of our continuing mission to provide you with exceptional heart care, we have created designated Provider Care Teams.  These Care Teams include your primary Cardiologist (physician) and Advanced Practice Providers (APPs -  Physician Assistants and Nurse Practitioners) who all work together to provide you with the care you need, when you need it.  Thank you for choosing [facility] at [facility]!!

## 2018-12-07 DIAGNOSIS — M25572 Pain in left ankle and joints of left foot: Secondary | ICD-10-CM | POA: Diagnosis not present

## 2018-12-07 DIAGNOSIS — M7662 Achilles tendinitis, left leg: Secondary | ICD-10-CM | POA: Diagnosis not present

## 2018-12-17 DIAGNOSIS — M7662 Achilles tendinitis, left leg: Secondary | ICD-10-CM | POA: Diagnosis not present

## 2018-12-24 DIAGNOSIS — M7662 Achilles tendinitis, left leg: Secondary | ICD-10-CM | POA: Diagnosis not present

## 2019-01-02 DIAGNOSIS — M545 Low back pain: Secondary | ICD-10-CM | POA: Diagnosis not present

## 2019-01-02 DIAGNOSIS — M0579 Rheumatoid arthritis with rheumatoid factor of multiple sites without organ or systems involvement: Secondary | ICD-10-CM | POA: Diagnosis not present

## 2019-01-02 DIAGNOSIS — M150 Primary generalized (osteo)arthritis: Secondary | ICD-10-CM | POA: Diagnosis not present

## 2019-01-02 DIAGNOSIS — E663 Overweight: Secondary | ICD-10-CM | POA: Diagnosis not present

## 2019-01-02 DIAGNOSIS — Z6827 Body mass index (BMI) 27.0-27.9, adult: Secondary | ICD-10-CM | POA: Diagnosis not present

## 2019-01-02 DIAGNOSIS — R202 Paresthesia of skin: Secondary | ICD-10-CM | POA: Diagnosis not present

## 2019-01-02 DIAGNOSIS — R5383 Other fatigue: Secondary | ICD-10-CM | POA: Diagnosis not present

## 2019-01-02 DIAGNOSIS — M7660 Achilles tendinitis, unspecified leg: Secondary | ICD-10-CM | POA: Diagnosis not present

## 2019-01-02 DIAGNOSIS — M2550 Pain in unspecified joint: Secondary | ICD-10-CM | POA: Diagnosis not present

## 2019-01-02 DIAGNOSIS — Z79899 Other long term (current) drug therapy: Secondary | ICD-10-CM | POA: Diagnosis not present

## 2019-01-04 DIAGNOSIS — M67879 Other specified disorders of synovium and tendon, unspecified ankle and foot: Secondary | ICD-10-CM | POA: Diagnosis not present

## 2019-01-08 NOTE — Telephone Encounter (Signed)
Opened in error

## 2019-01-10 DIAGNOSIS — M67879 Other specified disorders of synovium and tendon, unspecified ankle and foot: Secondary | ICD-10-CM | POA: Diagnosis not present

## 2019-01-16 DIAGNOSIS — M67879 Other specified disorders of synovium and tendon, unspecified ankle and foot: Secondary | ICD-10-CM | POA: Diagnosis not present

## 2019-01-23 DIAGNOSIS — M67879 Other specified disorders of synovium and tendon, unspecified ankle and foot: Secondary | ICD-10-CM | POA: Diagnosis not present

## 2019-01-29 DIAGNOSIS — M67879 Other specified disorders of synovium and tendon, unspecified ankle and foot: Secondary | ICD-10-CM | POA: Diagnosis not present

## 2019-02-05 DIAGNOSIS — M67879 Other specified disorders of synovium and tendon, unspecified ankle and foot: Secondary | ICD-10-CM | POA: Diagnosis not present

## 2019-02-07 ENCOUNTER — Other Ambulatory Visit: Payer: Self-pay | Admitting: Family Medicine

## 2019-02-07 DIAGNOSIS — C50411 Malignant neoplasm of upper-outer quadrant of right female breast: Secondary | ICD-10-CM

## 2019-02-07 DIAGNOSIS — D0511 Intraductal carcinoma in situ of right breast: Secondary | ICD-10-CM

## 2019-02-19 DIAGNOSIS — M67879 Other specified disorders of synovium and tendon, unspecified ankle and foot: Secondary | ICD-10-CM | POA: Diagnosis not present

## 2019-03-04 DIAGNOSIS — M67879 Other specified disorders of synovium and tendon, unspecified ankle and foot: Secondary | ICD-10-CM | POA: Diagnosis not present

## 2019-03-05 DIAGNOSIS — L404 Guttate psoriasis: Secondary | ICD-10-CM | POA: Diagnosis not present

## 2019-03-08 DIAGNOSIS — K219 Gastro-esophageal reflux disease without esophagitis: Secondary | ICD-10-CM | POA: Diagnosis not present

## 2019-03-08 DIAGNOSIS — M85852 Other specified disorders of bone density and structure, left thigh: Secondary | ICD-10-CM | POA: Diagnosis not present

## 2019-03-08 DIAGNOSIS — M1990 Unspecified osteoarthritis, unspecified site: Secondary | ICD-10-CM | POA: Diagnosis not present

## 2019-03-08 DIAGNOSIS — M069 Rheumatoid arthritis, unspecified: Secondary | ICD-10-CM | POA: Diagnosis not present

## 2019-03-08 DIAGNOSIS — M25562 Pain in left knee: Secondary | ICD-10-CM | POA: Diagnosis not present

## 2019-03-08 DIAGNOSIS — H409 Unspecified glaucoma: Secondary | ICD-10-CM | POA: Diagnosis not present

## 2019-03-08 DIAGNOSIS — I10 Essential (primary) hypertension: Secondary | ICD-10-CM | POA: Diagnosis not present

## 2019-03-08 DIAGNOSIS — R05 Cough: Secondary | ICD-10-CM | POA: Diagnosis not present

## 2019-03-08 DIAGNOSIS — R079 Chest pain, unspecified: Secondary | ICD-10-CM | POA: Diagnosis not present

## 2019-03-08 DIAGNOSIS — E785 Hyperlipidemia, unspecified: Secondary | ICD-10-CM | POA: Diagnosis not present

## 2019-03-08 DIAGNOSIS — Z0000 Encounter for general adult medical examination without abnormal findings: Secondary | ICD-10-CM | POA: Diagnosis not present

## 2019-03-08 DIAGNOSIS — D0511 Intraductal carcinoma in situ of right breast: Secondary | ICD-10-CM | POA: Diagnosis not present

## 2019-03-08 DIAGNOSIS — Z170 Estrogen receptor positive status [ER+]: Secondary | ICD-10-CM | POA: Diagnosis not present

## 2019-03-11 DIAGNOSIS — M67879 Other specified disorders of synovium and tendon, unspecified ankle and foot: Secondary | ICD-10-CM | POA: Diagnosis not present

## 2019-03-14 DIAGNOSIS — M1990 Unspecified osteoarthritis, unspecified site: Secondary | ICD-10-CM | POA: Diagnosis not present

## 2019-03-14 DIAGNOSIS — E785 Hyperlipidemia, unspecified: Secondary | ICD-10-CM | POA: Diagnosis not present

## 2019-03-14 DIAGNOSIS — D0511 Intraductal carcinoma in situ of right breast: Secondary | ICD-10-CM | POA: Diagnosis not present

## 2019-03-14 DIAGNOSIS — Z1389 Encounter for screening for other disorder: Secondary | ICD-10-CM | POA: Diagnosis not present

## 2019-03-14 DIAGNOSIS — L409 Psoriasis, unspecified: Secondary | ICD-10-CM | POA: Diagnosis not present

## 2019-03-14 DIAGNOSIS — M069 Rheumatoid arthritis, unspecified: Secondary | ICD-10-CM | POA: Diagnosis not present

## 2019-03-14 DIAGNOSIS — M7662 Achilles tendinitis, left leg: Secondary | ICD-10-CM | POA: Diagnosis not present

## 2019-03-14 DIAGNOSIS — I10 Essential (primary) hypertension: Secondary | ICD-10-CM | POA: Diagnosis not present

## 2019-03-14 DIAGNOSIS — R413 Other amnesia: Secondary | ICD-10-CM | POA: Diagnosis not present

## 2019-03-14 DIAGNOSIS — M85852 Other specified disorders of bone density and structure, left thigh: Secondary | ICD-10-CM | POA: Diagnosis not present

## 2019-03-14 DIAGNOSIS — Z0000 Encounter for general adult medical examination without abnormal findings: Secondary | ICD-10-CM | POA: Diagnosis not present

## 2019-03-20 DIAGNOSIS — D0511 Intraductal carcinoma in situ of right breast: Secondary | ICD-10-CM | POA: Diagnosis not present

## 2019-03-20 DIAGNOSIS — E785 Hyperlipidemia, unspecified: Secondary | ICD-10-CM | POA: Diagnosis not present

## 2019-03-20 DIAGNOSIS — H409 Unspecified glaucoma: Secondary | ICD-10-CM | POA: Diagnosis not present

## 2019-03-20 DIAGNOSIS — M67879 Other specified disorders of synovium and tendon, unspecified ankle and foot: Secondary | ICD-10-CM | POA: Diagnosis not present

## 2019-03-20 DIAGNOSIS — I10 Essential (primary) hypertension: Secondary | ICD-10-CM | POA: Diagnosis not present

## 2019-03-20 DIAGNOSIS — M069 Rheumatoid arthritis, unspecified: Secondary | ICD-10-CM | POA: Diagnosis not present

## 2019-03-20 DIAGNOSIS — M1990 Unspecified osteoarthritis, unspecified site: Secondary | ICD-10-CM | POA: Diagnosis not present

## 2019-03-27 DIAGNOSIS — M7662 Achilles tendinitis, left leg: Secondary | ICD-10-CM | POA: Diagnosis not present

## 2019-03-28 ENCOUNTER — Other Ambulatory Visit: Payer: Self-pay

## 2019-03-28 ENCOUNTER — Ambulatory Visit
Admission: RE | Admit: 2019-03-28 | Discharge: 2019-03-28 | Disposition: A | Discharge status: Home / Self Care | Payer: Medicare Other | Source: Ambulatory Visit | Attending: Family Medicine | Admitting: Family Medicine

## 2019-03-28 DIAGNOSIS — R928 Other abnormal and inconclusive findings on diagnostic imaging of breast: Secondary | ICD-10-CM | POA: Diagnosis not present

## 2019-03-28 DIAGNOSIS — D0511 Intraductal carcinoma in situ of right breast: Secondary | ICD-10-CM

## 2019-03-28 DIAGNOSIS — C50411 Malignant neoplasm of upper-outer quadrant of right female breast: Secondary | ICD-10-CM

## 2019-04-06 DIAGNOSIS — Z23 Encounter for immunization: Secondary | ICD-10-CM | POA: Diagnosis not present

## 2019-04-11 DIAGNOSIS — H903 Sensorineural hearing loss, bilateral: Secondary | ICD-10-CM | POA: Diagnosis not present

## 2019-05-08 DIAGNOSIS — M7662 Achilles tendinitis, left leg: Secondary | ICD-10-CM | POA: Diagnosis not present

## 2019-05-23 DIAGNOSIS — H35033 Hypertensive retinopathy, bilateral: Secondary | ICD-10-CM | POA: Diagnosis not present

## 2019-05-23 DIAGNOSIS — H401132 Primary open-angle glaucoma, bilateral, moderate stage: Secondary | ICD-10-CM | POA: Diagnosis not present

## 2019-05-23 DIAGNOSIS — Z79899 Other long term (current) drug therapy: Secondary | ICD-10-CM | POA: Diagnosis not present

## 2019-05-23 DIAGNOSIS — H43813 Vitreous degeneration, bilateral: Secondary | ICD-10-CM | POA: Diagnosis not present

## 2019-05-26 NOTE — Progress Notes (Signed)
Patient Care Team: Cari Caraway, MD as PCP - General (Family Medicine) Minus Breeding, MD as PCP - Cardiology (Cardiology) Nicholas Lose, MD as Consulting Physician (Hematology and Oncology) Delice Bison, Charlestine Massed, NP as Nurse Practitioner (Hematology and Oncology) Erroll Luna, MD as Consulting Physician (General Surgery) Eppie Gibson, MD as Attending Physician (Radiation Oncology)  DIAGNOSIS:    ICD-10-CM   1. Malignant neoplasm of upper-outer quadrant of right breast in female, estrogen receptor positive ([place])  C50.411    Z17.0     SUMMARY OF ONCOLOGIC HISTORY: Oncology History  Malignant neoplasm of upper-outer quadrant of right breast in female, estrogen receptor positive ([place])  04/12/2017 Initial Diagnosis   right breast suspicious mass at 12 o'clock position 1.1 cm; left breast 1 o'clock position 0.6 cm   05/09/2017 Surgery   Right lumpectomy: IDC grade 1, 1.1 cm, low-grade DCIS, margins negative, 0/3 lymph nodes negative ER 100%, PR 100%, HER-2 negative ratio 1.26, Ki-67 1%, T1CN0 stage I a; left lumpectomy: Intraductal papilloma, ADH   05/2017 -  Anti-estrogen oral therapy   Anastrozole daily     CHIEF COMPLIANT: Follow-up on anastrozole therapy  INTERVAL HISTORY: Christina Henderson is a 83 y.o. with above-mentioned history of right breast cancer treated with lumpectomy and who is currently on antiestrogen therapy with anastrozole. I last saw her a year ago. Mammogram on 03/28/19 showed no evidence of malignancy bilaterally. She presents to the clinic today for follow-up.  She is staying very active and can swim up to 5 miles a week.   REVIEW OF SYSTEMS:   Constitutional: Denies fevers, chills or abnormal weight loss Eyes: Denies blurriness of vision Ears, nose, mouth, throat, and face: Denies mucositis or sore throat Respiratory: Denies cough, dyspnea or wheezes Cardiovascular: Denies palpitation, chest discomfort Gastrointestinal: Denies nausea,  heartburn or change in bowel habits Skin: Denies abnormal skin rashes Lymphatics: Denies new lymphadenopathy or easy bruising Neurological: Denies numbness, tingling or new weaknesses Behavioral/Psych: Mood is stable, no new changes  Extremities: No lower extremity edema Breast: denies any pain or lumps or nodules in either breasts All other systems were reviewed with the patient and are negative.  I have reviewed the past medical history, past surgical history, social history and family history with the patient and they are unchanged from previous note.  ALLERGIES:  is allergic to caffeine; statins; and sulfa antibiotics.  MEDICATIONS:  Current Outpatient Medications  Medication Sig Dispense Refill  . amLODipine (NORVASC) 2.5 MG tablet Take 1 tablet by mouth daily.    Marland Kitchen anastrozole (ARIMIDEX) 1 MG tablet Take 1 tablet (1 mg total) by mouth daily. 90 tablet 3  . Calcium Carbonate-Vitamin D (CALCIUM 500+D HIGH POTENCY PO) Take by mouth 3 (three) times daily.    . cholecalciferol (VITAMIN D) 1000 units tablet Take 1,000 Units by mouth daily.    Marland Kitchen diltiazem (CARDIZEM CD) 180 MG 24 hr capsule Take 1 capsule (180 mg total) by mouth daily. 90 capsule 3  . fluocinolone (VANOS) 0.01 % cream Apply topically 2 (two) times daily. 30 g 0  . hydroxychloroquine (PLAQUENIL) 200 MG tablet Take 1 tablet (200 mg total) by mouth daily.    Marland Kitchen ibandronate (BONIVA) 150 MG tablet Take 1 tablet by mouth every 30 (thirty) days.    Marland Kitchen losartan (COZAAR) 100 MG tablet Take 100 mg by mouth daily.    . Multiple Vitamin (MULTIVITAMIN WITH MINERALS) TABS tablet Take 1 tablet by mouth daily.    . nitroGLYCERIN (NITRODUR - DOSED IN MG/24  HR) 0.2 mg/hr patch Place 1 patch (0.2 mg total) onto the skin daily. 30 patch 12  . Probiotic Product (PROBIOTIC ACIDOPHILUS BIOBEADS) CAPS Take by mouth daily.      No current facility-administered medications for this visit.     PHYSICAL EXAMINATION: ECOG PERFORMANCE STATUS: 1 -  Symptomatic but completely ambulatory  Vitals:   05/27/19 1047  BP: (!) 144/80  Pulse: (!) 50  Resp: 17  SpO2: 97%   Filed Weights   05/27/19 1047  Weight: 147 lb 12.8 oz (67 kg)    GENERAL: alert, no distress and comfortable SKIN: skin color, texture, turgor are normal, no rashes or significant lesions EYES: normal, Conjunctiva are pink and non-injected, sclera clear OROPHARYNX: no exudate, no erythema and lips, buccal mucosa, and tongue normal  NECK: supple, thyroid normal size, non-tender, without nodularity LYMPH: no palpable lymphadenopathy in the cervical, axillary or inguinal LUNGS: clear to auscultation and percussion with normal breathing effort HEART: regular rate & rhythm and no murmurs and no lower extremity edema ABDOMEN: abdomen soft, non-tender and normal bowel sounds MUSCULOSKELETAL: no cyanosis of digits and no clubbing  NEURO: alert & oriented x 3 with fluent speech, no focal motor/sensory deficits EXTREMITIES: No lower extremity edema BREAST: No palpable masses or nodules in either right or left breasts. No palpable axillary supraclavicular or infraclavicular adenopathy no breast tenderness or nipple discharge. (exam performed in the presence of a chaperone)  LABORATORY DATA:  I have reviewed the data as listed CMP Latest Ref Rng & Units 05/03/2017  Glucose 65 - 99 mg/dL 88  BUN 6 - 20 mg/dL 24(H)  Creatinine 0.44 - 1.00 mg/dL 0.74  Sodium 135 - 145 mmol/L 140  Potassium 3.5 - 5.1 mmol/L 4.2  Chloride 101 - 111 mmol/L 105  CO2 22 - 32 mmol/L 28  Calcium 8.9 - 10.3 mg/dL 10.0  Total Protein 6.5 - 8.1 g/dL 7.1  Total Bilirubin 0.3 - 1.2 mg/dL 1.0  Alkaline Phos 38 - 126 U/L 63  AST 15 - 41 U/L 24  ALT 14 - 54 U/L 25    Lab Results  Component Value Date   WBC 6.7 05/03/2017   HGB 14.3 05/03/2017   HCT 43.9 05/03/2017   MCV 93.8 05/03/2017   PLT 233 05/03/2017   NEUTROABS 4.1 05/03/2017    ASSESSMENT & PLAN:  Malignant neoplasm of upper-outer  quadrant of right breast in female, estrogen receptor positive ([facility]) 05/09/2017:Right lumpectomy: IDC grade 1, 1.1 cm, low-grade DCIS, margins negative, 0/3 lymph nodes negative ER 100%, PR 100%, HER-2 negative ratio 1.26, Ki-67 1%, T1CN0 stage I a; left lumpectomy: Intraductal papilloma, ADH  Current treatment: Anastrozole 1 mg p.o. daily started 05/23/2017  Anastrozole toxicities: Denies any side effects.  She has rheumatoid arthritis which causes her some joint aches and stiffness.  Breast cancer surveillance: 1.  Mammogram 03/28/2019: Benign breast density category B 2.  Breast exam 05/27/2019: Benign  Return to clinic in 1 year for follow-up    No orders of the defined types were placed in this encounter.  The patient has a good understanding of the overall plan. she agrees with it. she will call with any problems that may develop before the next visit here.  Gudena, Vinay, MD 05/27/2019  I, Molly Dorshimer, am acting as scribe for Dr. Vinay Gudena.  I have reviewed the above documentation for accuracy and completeness, and I agree with the above.       

## 2019-05-27 ENCOUNTER — Telehealth: Payer: Self-pay | Admitting: Hematology and Oncology

## 2019-05-27 ENCOUNTER — Inpatient Hospital Stay: Payer: Medicare Other | Attending: Hematology and Oncology | Admitting: Hematology and Oncology

## 2019-05-27 ENCOUNTER — Other Ambulatory Visit: Payer: Self-pay

## 2019-05-27 DIAGNOSIS — C50411 Malignant neoplasm of upper-outer quadrant of right female breast: Secondary | ICD-10-CM

## 2019-05-27 DIAGNOSIS — Z170 Estrogen receptor positive status [ER+]: Secondary | ICD-10-CM | POA: Insufficient documentation

## 2019-05-27 DIAGNOSIS — M069 Rheumatoid arthritis, unspecified: Secondary | ICD-10-CM | POA: Insufficient documentation

## 2019-05-27 MED ORDER — FLUOCINOLONE ACETONIDE 0.01 % EX CREA: TOPICAL_CREAM | Freq: Two times a day (BID) | CUTANEOUS | 0 refills | Status: DC

## 2019-05-27 MED ORDER — NITROGLYCERIN 0.2 MG/HR TD PT24: 0.2000 mg | MEDICATED_PATCH | Freq: Every day | TRANSDERMAL | 12 refills | Status: DC

## 2019-05-27 MED ORDER — ANASTROZOLE 1 MG PO TABS: 1.0000 mg | ORAL_TABLET | Freq: Every day | ORAL | 3 refills | Status: DC

## 2019-05-27 MED ORDER — HYDROXYCHLOROQUINE SULFATE 200 MG PO TABS: 200.0000 mg | ORAL_TABLET | Freq: Every day | ORAL | Status: DC

## 2019-05-27 NOTE — Assessment & Plan Note (Signed)
05/09/2017:Right lumpectomy: IDC grade 1, 1.1 cm, low-grade DCIS, margins negative, 0/3 lymph nodes negative ER 100%, PR 100%, HER-2 negative ratio 1.26, Ki-67 1%, T1CN0 stage I a; left lumpectomy: Intraductal papilloma, ADH  Current treatment: Anastrozole 1 mg p.o. daily started 05/23/2017  Anastrozole toxicities: Denies any side effects.  She has rheumatoid arthritis which causes her some joint aches and stiffness.  Breast cancer surveillance: 1.  Mammogram 03/28/2019: Benign breast density category B 2.  Breast exam 05/27/2019: Benign  Return to clinic in 1 year for follow-up

## 2019-05-27 NOTE — Telephone Encounter (Signed)
I talk with patient regarding schedule  

## 2019-06-05 DIAGNOSIS — M25561 Pain in right knee: Secondary | ICD-10-CM | POA: Diagnosis not present

## 2019-06-12 DIAGNOSIS — D692 Other nonthrombocytopenic purpura: Secondary | ICD-10-CM | POA: Diagnosis not present

## 2019-06-12 DIAGNOSIS — L404 Guttate psoriasis: Secondary | ICD-10-CM | POA: Diagnosis not present

## 2019-07-19 DIAGNOSIS — M7662 Achilles tendinitis, left leg: Secondary | ICD-10-CM | POA: Diagnosis not present

## 2019-07-19 DIAGNOSIS — M25561 Pain in right knee: Secondary | ICD-10-CM | POA: Diagnosis not present

## 2019-07-19 DIAGNOSIS — M79672 Pain in left foot: Secondary | ICD-10-CM | POA: Diagnosis not present

## 2019-07-25 DIAGNOSIS — Z23 Encounter for immunization: Secondary | ICD-10-CM | POA: Diagnosis not present

## 2019-07-29 DIAGNOSIS — M84361A Stress fracture, right tibia, initial encounter for fracture: Secondary | ICD-10-CM | POA: Diagnosis not present

## 2019-07-29 DIAGNOSIS — S83241A Other tear of medial meniscus, current injury, right knee, initial encounter: Secondary | ICD-10-CM | POA: Diagnosis not present

## 2019-08-06 DIAGNOSIS — X58XXXA Exposure to other specified factors, initial encounter: Secondary | ICD-10-CM | POA: Diagnosis not present

## 2019-08-06 DIAGNOSIS — G8918 Other acute postprocedural pain: Secondary | ICD-10-CM | POA: Diagnosis not present

## 2019-08-06 DIAGNOSIS — S83231A Complex tear of medial meniscus, current injury, right knee, initial encounter: Secondary | ICD-10-CM | POA: Diagnosis not present

## 2019-08-06 DIAGNOSIS — M84361A Stress fracture, right tibia, initial encounter for fracture: Secondary | ICD-10-CM | POA: Diagnosis not present

## 2019-08-06 DIAGNOSIS — Y999 Unspecified external cause status: Secondary | ICD-10-CM | POA: Diagnosis not present

## 2019-08-06 DIAGNOSIS — M2241 Chondromalacia patellae, right knee: Secondary | ICD-10-CM | POA: Diagnosis not present

## 2019-08-06 DIAGNOSIS — M94261 Chondromalacia, right knee: Secondary | ICD-10-CM | POA: Diagnosis not present

## 2019-08-06 DIAGNOSIS — M84352A Stress fracture, left femur, initial encounter for fracture: Secondary | ICD-10-CM | POA: Diagnosis not present

## 2019-08-08 DIAGNOSIS — I10 Essential (primary) hypertension: Secondary | ICD-10-CM | POA: Diagnosis not present

## 2019-08-08 DIAGNOSIS — H409 Unspecified glaucoma: Secondary | ICD-10-CM | POA: Diagnosis not present

## 2019-08-08 DIAGNOSIS — M1990 Unspecified osteoarthritis, unspecified site: Secondary | ICD-10-CM | POA: Diagnosis not present

## 2019-08-08 DIAGNOSIS — M069 Rheumatoid arthritis, unspecified: Secondary | ICD-10-CM | POA: Diagnosis not present

## 2019-08-08 DIAGNOSIS — E785 Hyperlipidemia, unspecified: Secondary | ICD-10-CM | POA: Diagnosis not present

## 2019-08-08 DIAGNOSIS — D0511 Intraductal carcinoma in situ of right breast: Secondary | ICD-10-CM | POA: Diagnosis not present

## 2019-08-13 DIAGNOSIS — M25561 Pain in right knee: Secondary | ICD-10-CM | POA: Diagnosis not present

## 2019-08-16 DIAGNOSIS — M25561 Pain in right knee: Secondary | ICD-10-CM | POA: Diagnosis not present

## 2019-08-19 DIAGNOSIS — M25561 Pain in right knee: Secondary | ICD-10-CM | POA: Diagnosis not present

## 2019-08-21 DIAGNOSIS — Z23 Encounter for immunization: Secondary | ICD-10-CM | POA: Diagnosis not present

## 2019-08-23 DIAGNOSIS — M25561 Pain in right knee: Secondary | ICD-10-CM | POA: Diagnosis not present

## 2019-08-27 DIAGNOSIS — M25561 Pain in right knee: Secondary | ICD-10-CM | POA: Diagnosis not present

## 2019-08-30 DIAGNOSIS — M25561 Pain in right knee: Secondary | ICD-10-CM | POA: Diagnosis not present

## 2019-09-03 DIAGNOSIS — M25561 Pain in right knee: Secondary | ICD-10-CM | POA: Diagnosis not present

## 2019-09-06 DIAGNOSIS — M25561 Pain in right knee: Secondary | ICD-10-CM | POA: Diagnosis not present

## 2019-09-12 ENCOUNTER — Other Ambulatory Visit: Payer: Self-pay | Admitting: Family Medicine

## 2019-09-12 DIAGNOSIS — M069 Rheumatoid arthritis, unspecified: Secondary | ICD-10-CM | POA: Diagnosis not present

## 2019-09-12 DIAGNOSIS — L409 Psoriasis, unspecified: Secondary | ICD-10-CM | POA: Diagnosis not present

## 2019-09-12 DIAGNOSIS — M85852 Other specified disorders of bone density and structure, left thigh: Secondary | ICD-10-CM | POA: Diagnosis not present

## 2019-09-12 DIAGNOSIS — H409 Unspecified glaucoma: Secondary | ICD-10-CM | POA: Diagnosis not present

## 2019-09-12 DIAGNOSIS — R413 Other amnesia: Secondary | ICD-10-CM | POA: Diagnosis not present

## 2019-09-12 DIAGNOSIS — M1990 Unspecified osteoarthritis, unspecified site: Secondary | ICD-10-CM | POA: Diagnosis not present

## 2019-09-12 DIAGNOSIS — E785 Hyperlipidemia, unspecified: Secondary | ICD-10-CM | POA: Diagnosis not present

## 2019-09-12 DIAGNOSIS — M8580 Other specified disorders of bone density and structure, unspecified site: Secondary | ICD-10-CM

## 2019-09-12 DIAGNOSIS — K219 Gastro-esophageal reflux disease without esophagitis: Secondary | ICD-10-CM | POA: Diagnosis not present

## 2019-09-12 DIAGNOSIS — I10 Essential (primary) hypertension: Secondary | ICD-10-CM | POA: Diagnosis not present

## 2019-09-12 DIAGNOSIS — D0511 Intraductal carcinoma in situ of right breast: Secondary | ICD-10-CM | POA: Diagnosis not present

## 2019-09-13 DIAGNOSIS — M7662 Achilles tendinitis, left leg: Secondary | ICD-10-CM | POA: Diagnosis not present

## 2019-09-17 DIAGNOSIS — L404 Guttate psoriasis: Secondary | ICD-10-CM | POA: Diagnosis not present

## 2019-09-17 DIAGNOSIS — L821 Other seborrheic keratosis: Secondary | ICD-10-CM | POA: Diagnosis not present

## 2019-10-14 DIAGNOSIS — M7662 Achilles tendinitis, left leg: Secondary | ICD-10-CM | POA: Diagnosis not present

## 2019-10-24 DIAGNOSIS — H5213 Myopia, bilateral: Secondary | ICD-10-CM | POA: Diagnosis not present

## 2019-10-24 DIAGNOSIS — Z79899 Other long term (current) drug therapy: Secondary | ICD-10-CM | POA: Diagnosis not present

## 2019-10-24 DIAGNOSIS — H43813 Vitreous degeneration, bilateral: Secondary | ICD-10-CM | POA: Diagnosis not present

## 2019-10-24 DIAGNOSIS — H52223 Regular astigmatism, bilateral: Secondary | ICD-10-CM | POA: Diagnosis not present

## 2019-10-24 DIAGNOSIS — H35033 Hypertensive retinopathy, bilateral: Secondary | ICD-10-CM | POA: Diagnosis not present

## 2019-10-24 DIAGNOSIS — H401132 Primary open-angle glaucoma, bilateral, moderate stage: Secondary | ICD-10-CM | POA: Diagnosis not present

## 2019-11-25 DIAGNOSIS — L404 Guttate psoriasis: Secondary | ICD-10-CM | POA: Diagnosis not present

## 2019-11-25 DIAGNOSIS — E785 Hyperlipidemia, unspecified: Secondary | ICD-10-CM | POA: Insufficient documentation

## 2019-11-25 DIAGNOSIS — Z7189 Other specified counseling: Secondary | ICD-10-CM | POA: Insufficient documentation

## 2019-11-25 DIAGNOSIS — L821 Other seborrheic keratosis: Secondary | ICD-10-CM | POA: Diagnosis not present

## 2019-11-25 NOTE — Progress Notes (Signed)
Cardiology Office Note   Date:  11/26/2019   ID:  Christina Henderson, DOB 10/14/1935, MRN [id number]  PCP:  Cari Caraway, MD  Cardiologist:   Minus Breeding, MD   No chief complaint on file.     History of Present Illness: Christina Henderson is a 84 y.o. female who presents for follow up of chest pain.   She had a negative POET (Plain Old Exercise Treadmill) in 2019.  Since I last saw her she is going back to swimming.  She had some problems such as tearing her Achilles.  She developed psoriasis.  She had arthroscopic knee surgery.  She had some cramping in her toes.  With all of this has been doing well otherwise.  She denies any chest pressure, neck or arm discomfort.  Has had no palpitations, presyncope or syncope.  She does not have any significant shortness of breath.  Past Medical History:  Diagnosis Date  . Arthritis    rhuematoid arthritis  . Breast cancer ([place])   . Cancer ([place]) 03/2017   right breast cancer  . Complication of anesthesia    hard to wake up  . GERD (gastroesophageal reflux disease)    food related  . Glaucoma   . Hypertension   . Osteoporosis    osteopenia  . Rosacea     Past Surgical History:  Procedure Laterality Date  . APPENDECTOMY    . BACK SURGERY    . BREAST BIOPSY    . BREAST LUMPECTOMY Right 04/2017  . BREAST LUMPECTOMY WITH RADIOACTIVE SEED AND SENTINEL LYMPH NODE BIOPSY Bilateral 05/09/2017   Procedure: BILATERAL RADIOACTIVE SEED GUIDED LUMPECTOMIES WITH RIGHT SENTINEL LYMPH NODE BIOPSY;  Surgeon: Erroll Luna, MD;  Location: [facility];  Service: General;  Laterality: Bilateral;  . EYE SURGERY     cataract  . SPINE SURGERY     lumbar lamenectomy  . TONSILLECTOMY       Current Outpatient Medications  Medication Sig Dispense Refill  . amLODipine (NORVASC) 2.5 MG tablet Take 1 tablet by mouth daily.    Marland Kitchen anastrozole (ARIMIDEX) 1 MG tablet Take 1 tablet (1 mg total) by mouth daily. 90 tablet 3  .  COVID-19 mRNA vaccine, Moderna, 100 MCG/0.5ML SUSP Moderna COVID-19 Vaccine (PF) 100 mcg/0.5 mL intramuscular susp. (EUA)  PHARMACY ADMINISTERED    . fluocinolone (VANOS) 0.01 % cream Apply topically 2 (two) times daily. 30 g 0  . hydrocortisone 2.5 % cream hydrocortisone 2.5 % topical cream    . hydroxychloroquine (PLAQUENIL) 200 MG tablet Take 1 tablet (200 mg total) by mouth daily.    Marland Kitchen ibandronate (BONIVA) 150 MG tablet Take 1 tablet by mouth every 30 (thirty) days.    Marland Kitchen losartan (COZAAR) 100 MG tablet Take 1 tablet (100 mg total) by mouth daily. 90 tablet 3  . Multiple Vitamin (MULTIVITAMIN WITH MINERALS) TABS tablet Take 1 tablet by mouth daily.    . Probiotic Product (PROBIOTIC ACIDOPHILUS BIOBEADS) CAPS Take by mouth daily.     Marland Kitchen diltiazem (CARDIZEM CD) 180 MG 24 hr capsule Take 1 capsule (180 mg total) by mouth daily. 90 capsule 3   No current facility-administered medications for this visit.    Allergies:   Caffeine, Statins, and Sulfa antibiotics    ROS:  Please see the history of present illness.   Otherwise, review of systems are positive for none.   All other systems are reviewed and negative.    PHYSICAL EXAM: VS:  BP Marland Kitchen)  165/67   Pulse 60   Temp (!) 97.5 F (36.4 C)   Resp 14   Ht 5' 1.5" (1.562 m)   Wt 150 lb 9.6 oz (68.3 kg)   SpO2 96%   BMI 27.99 kg/m  , BMI Body mass index is 27.99 kg/m. GENERAL:  Well appearing NECK:  No jugular venous distention, waveform within normal limits, carotid upstroke brisk and symmetric, no bruits, no thyromegaly LUNGS:  Clear to auscultation bilaterally CHEST:  Unremarkable HEART:  PMI not displaced or sustained,S1 and S2 within normal limits, no S3, no S4, no clicks, no rubs, no murmurs ABD:  Flat, positive bowel sounds normal in frequency in pitch, no bruits, no rebound, no guarding, no midline pulsatile mass, no hepatomegaly, no splenomegaly EXT:  2 plus pulses throughout, no edema, no cyanosis no clubbing   EKG:  EKG is  ordered today. The ekg ordered today demonstrates normal sinus rhythm, rate 57, axis within normal limits, intervals within normal limits, no acute ST-T wave changes.   Recent Labs: No results found for requested labs within last 8760 hours.    Lipid Panel No results found for: CHOL, TRIG, HDL, CHOLHDL, VLDL, LDLCALC, LDLDIRECT    Wt Readings from Last 3 Encounters:  11/26/19 150 lb 9.6 oz (68.3 kg)  05/27/19 147 lb 12.8 oz (67 kg)  11/23/18 145 lb 9.6 oz (66 kg)      Other studies Reviewed: Additional studies/ records that were reviewed today include: Labs. Review of the above records demonstrates:  Please see elsewhere in the note.     ASSESSMENT AND PLAN:  CHEST PAIN:   The patient is no longer having chest discomfort.  No further work-up is suggested.   HTN: Her blood pressure isrunning high.  She is going to keep an eye on this because it is typically in the 130s over 70s at home.  I would adjust her medications as needed.  Of note I did reduce her Cardizem because of bradycardia.  She is oddly on both Cardizem and Norvasc but it really seems to be working so unless I need to I will not make a change at which point I might switch her completely to Norvasc possibly discontinuing the Cardizem.\  DYSLIPIDEMIA:      She is going to get a recent lipid profile.  She has been intolerant of statins.  However, we would discuss further therapies if she is much above where she was at the last visit which was in the 160s with an LDL.  Current medicines are reviewed at length with the patient today.  The patient does not have concerns regarding medicines.  The following changes have been made:  no change  Labs/ tests ordered today include: None  Orders Placed This Encounter  Procedures  . EKG 12-Lead     Disposition:   FU with me in one year.     Signed, Minus Breeding, MD  11/26/2019 3:06 PM    [facility]

## 2019-11-26 ENCOUNTER — Encounter: Payer: Self-pay | Admitting: Cardiology

## 2019-11-26 ENCOUNTER — Other Ambulatory Visit: Payer: Self-pay

## 2019-11-26 ENCOUNTER — Ambulatory Visit (INDEPENDENT_AMBULATORY_CARE_PROVIDER_SITE_OTHER): Payer: Medicare Other | Admitting: Cardiology

## 2019-11-26 VITALS — BP 165/67 | HR 60 | Temp 97.5°F | Resp 14 | Ht 61.5 in | Wt 150.6 lb

## 2019-11-26 DIAGNOSIS — R079 Chest pain, unspecified: Secondary | ICD-10-CM

## 2019-11-26 DIAGNOSIS — I10 Essential (primary) hypertension: Secondary | ICD-10-CM | POA: Diagnosis not present

## 2019-11-26 DIAGNOSIS — R059 Cough, unspecified: Secondary | ICD-10-CM

## 2019-11-26 DIAGNOSIS — E785 Hyperlipidemia, unspecified: Secondary | ICD-10-CM

## 2019-11-26 DIAGNOSIS — R05 Cough: Secondary | ICD-10-CM | POA: Diagnosis not present

## 2019-11-26 DIAGNOSIS — Z7189 Other specified counseling: Secondary | ICD-10-CM

## 2019-11-26 MED ORDER — DILTIAZEM HCL ER COATED BEADS 180 MG PO CP24: 180.0000 mg | ORAL_CAPSULE | Freq: Every day | ORAL | 3 refills | Status: DC

## 2019-11-26 MED ORDER — LOSARTAN POTASSIUM 100 MG PO TABS: 100.0000 mg | ORAL_TABLET | Freq: Every day | ORAL | 3 refills | Status: DC

## 2019-11-26 NOTE — Patient Instructions (Signed)
Medication Instructions:  NO CHANGES *If you need a refill on your cardiac medications before your next appointment, please call your pharmacy*  Lab Work: NONE ORDERED THIS VISIT If you have labs (blood work) drawn today and your tests are completely normal, you will receive your results only by: Marland Kitchen MyChart Message (if you have MyChart) OR . A paper copy in the mail If you have any lab test that is abnormal or we need to change your treatment, we will call you to review the results.  Testing/Procedures: NONE ORDERED THIS VISIT  Follow-Up: At [facility], you and your health needs are our priority.  As part of our continuing mission to provide you with exceptional heart care, we have created designated Provider Care Teams.  These Care Teams include your primary Cardiologist (physician) and Advanced Practice Providers (APPs -  Physician Assistants and Nurse Practitioners) who all work together to provide you with the care you need, when you need it.  Your next appointment:   12 month(s)  You will receive a reminder letter in the mail two months in advance. If you don't receive a letter, please call our office to schedule the follow-up appointment.  The format for your next appointment:   In Person  Provider:   Minus Breeding, MD

## 2019-12-04 DIAGNOSIS — M1711 Unilateral primary osteoarthritis, right knee: Secondary | ICD-10-CM | POA: Diagnosis not present

## 2019-12-11 ENCOUNTER — Ambulatory Visit
Admission: RE | Admit: 2019-12-11 | Discharge: 2019-12-11 | Disposition: A | Discharge status: Home / Self Care | Payer: Medicare Other | Source: Ambulatory Visit | Attending: Family Medicine | Admitting: Family Medicine

## 2019-12-11 ENCOUNTER — Other Ambulatory Visit: Payer: Self-pay

## 2019-12-11 DIAGNOSIS — Z780 Asymptomatic menopausal state: Secondary | ICD-10-CM | POA: Diagnosis not present

## 2019-12-11 DIAGNOSIS — M8580 Other specified disorders of bone density and structure, unspecified site: Secondary | ICD-10-CM

## 2019-12-11 DIAGNOSIS — M85852 Other specified disorders of bone density and structure, left thigh: Secondary | ICD-10-CM | POA: Diagnosis not present

## 2019-12-30 DIAGNOSIS — M0579 Rheumatoid arthritis with rheumatoid factor of multiple sites without organ or systems involvement: Secondary | ICD-10-CM | POA: Diagnosis not present

## 2019-12-30 DIAGNOSIS — E663 Overweight: Secondary | ICD-10-CM | POA: Diagnosis not present

## 2019-12-30 DIAGNOSIS — Z6826 Body mass index (BMI) 26.0-26.9, adult: Secondary | ICD-10-CM | POA: Diagnosis not present

## 2019-12-30 DIAGNOSIS — Z79899 Other long term (current) drug therapy: Secondary | ICD-10-CM | POA: Diagnosis not present

## 2019-12-30 DIAGNOSIS — G5601 Carpal tunnel syndrome, right upper limb: Secondary | ICD-10-CM | POA: Diagnosis not present

## 2019-12-30 DIAGNOSIS — M2550 Pain in unspecified joint: Secondary | ICD-10-CM | POA: Diagnosis not present

## 2019-12-30 DIAGNOSIS — L409 Psoriasis, unspecified: Secondary | ICD-10-CM | POA: Diagnosis not present

## 2019-12-30 DIAGNOSIS — M150 Primary generalized (osteo)arthritis: Secondary | ICD-10-CM | POA: Diagnosis not present

## 2020-01-29 DIAGNOSIS — L821 Other seborrheic keratosis: Secondary | ICD-10-CM | POA: Diagnosis not present

## 2020-01-29 DIAGNOSIS — L404 Guttate psoriasis: Secondary | ICD-10-CM | POA: Diagnosis not present

## 2020-02-21 ENCOUNTER — Other Ambulatory Visit: Payer: Self-pay | Admitting: Hematology and Oncology

## 2020-02-21 DIAGNOSIS — Z9889 Other specified postprocedural states: Secondary | ICD-10-CM

## 2020-02-26 DIAGNOSIS — M1711 Unilateral primary osteoarthritis, right knee: Secondary | ICD-10-CM | POA: Diagnosis not present

## 2020-03-13 DIAGNOSIS — L409 Psoriasis, unspecified: Secondary | ICD-10-CM | POA: Diagnosis not present

## 2020-03-13 DIAGNOSIS — M1990 Unspecified osteoarthritis, unspecified site: Secondary | ICD-10-CM | POA: Diagnosis not present

## 2020-03-13 DIAGNOSIS — R413 Other amnesia: Secondary | ICD-10-CM | POA: Diagnosis not present

## 2020-03-13 DIAGNOSIS — D0511 Intraductal carcinoma in situ of right breast: Secondary | ICD-10-CM | POA: Diagnosis not present

## 2020-03-13 DIAGNOSIS — E785 Hyperlipidemia, unspecified: Secondary | ICD-10-CM | POA: Diagnosis not present

## 2020-03-13 DIAGNOSIS — I10 Essential (primary) hypertension: Secondary | ICD-10-CM | POA: Diagnosis not present

## 2020-03-13 DIAGNOSIS — M069 Rheumatoid arthritis, unspecified: Secondary | ICD-10-CM | POA: Diagnosis not present

## 2020-03-13 DIAGNOSIS — Z0000 Encounter for general adult medical examination without abnormal findings: Secondary | ICD-10-CM | POA: Diagnosis not present

## 2020-03-13 DIAGNOSIS — M7662 Achilles tendinitis, left leg: Secondary | ICD-10-CM | POA: Diagnosis not present

## 2020-03-13 DIAGNOSIS — M85852 Other specified disorders of bone density and structure, left thigh: Secondary | ICD-10-CM | POA: Diagnosis not present

## 2020-03-19 DIAGNOSIS — L409 Psoriasis, unspecified: Secondary | ICD-10-CM | POA: Diagnosis not present

## 2020-03-19 DIAGNOSIS — Z23 Encounter for immunization: Secondary | ICD-10-CM | POA: Diagnosis not present

## 2020-03-19 DIAGNOSIS — K219 Gastro-esophageal reflux disease without esophagitis: Secondary | ICD-10-CM | POA: Diagnosis not present

## 2020-03-19 DIAGNOSIS — M1990 Unspecified osteoarthritis, unspecified site: Secondary | ICD-10-CM | POA: Diagnosis not present

## 2020-03-19 DIAGNOSIS — E785 Hyperlipidemia, unspecified: Secondary | ICD-10-CM | POA: Diagnosis not present

## 2020-03-19 DIAGNOSIS — D0511 Intraductal carcinoma in situ of right breast: Secondary | ICD-10-CM | POA: Diagnosis not present

## 2020-03-19 DIAGNOSIS — R413 Other amnesia: Secondary | ICD-10-CM | POA: Diagnosis not present

## 2020-03-19 DIAGNOSIS — I10 Essential (primary) hypertension: Secondary | ICD-10-CM | POA: Diagnosis not present

## 2020-03-19 DIAGNOSIS — M069 Rheumatoid arthritis, unspecified: Secondary | ICD-10-CM | POA: Diagnosis not present

## 2020-03-19 DIAGNOSIS — Z1389 Encounter for screening for other disorder: Secondary | ICD-10-CM | POA: Diagnosis not present

## 2020-03-19 DIAGNOSIS — Z0000 Encounter for general adult medical examination without abnormal findings: Secondary | ICD-10-CM | POA: Diagnosis not present

## 2020-03-19 DIAGNOSIS — M85852 Other specified disorders of bone density and structure, left thigh: Secondary | ICD-10-CM | POA: Diagnosis not present

## 2020-03-27 IMAGING — MG MM DIGITAL DIAGNOSTIC BILAT W/ TOMO W/ CAD
8 of 13 series · 8 of 37 positions shown · non-contrast
Comparison: Previous exam(s).

CLINICAL DATA: History of treated right breast cancer, status post
lumpectomy in April 2017. History of benign excisional biopsy of
the left breast for a papilloma in April 2017

EXAM:
DIGITAL DIAGNOSTIC BILATERAL MAMMOGRAM WITH CAD AND TOMO

[R CC]
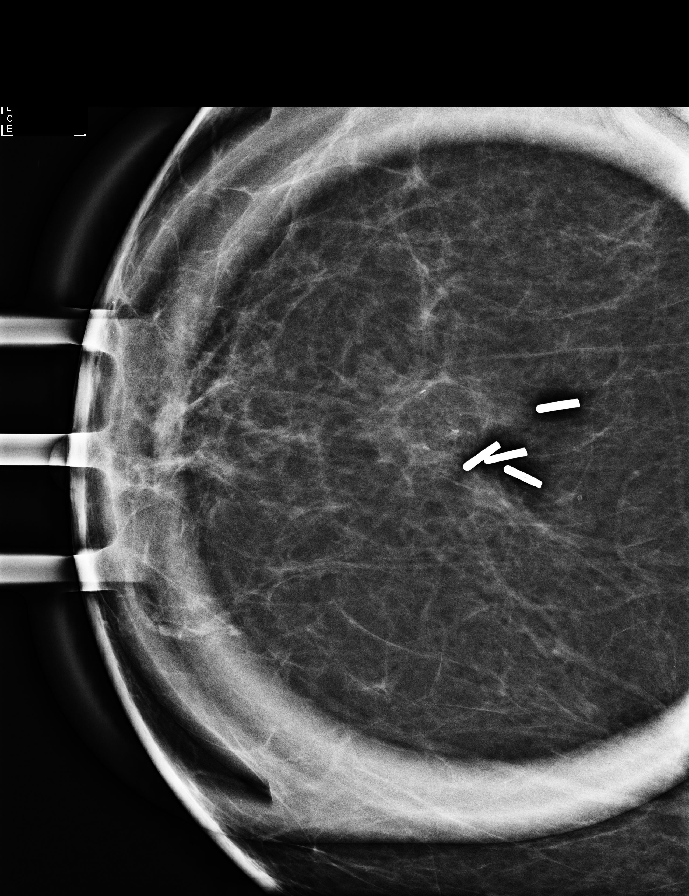

[L MLO synth-2D (1 of 2)]
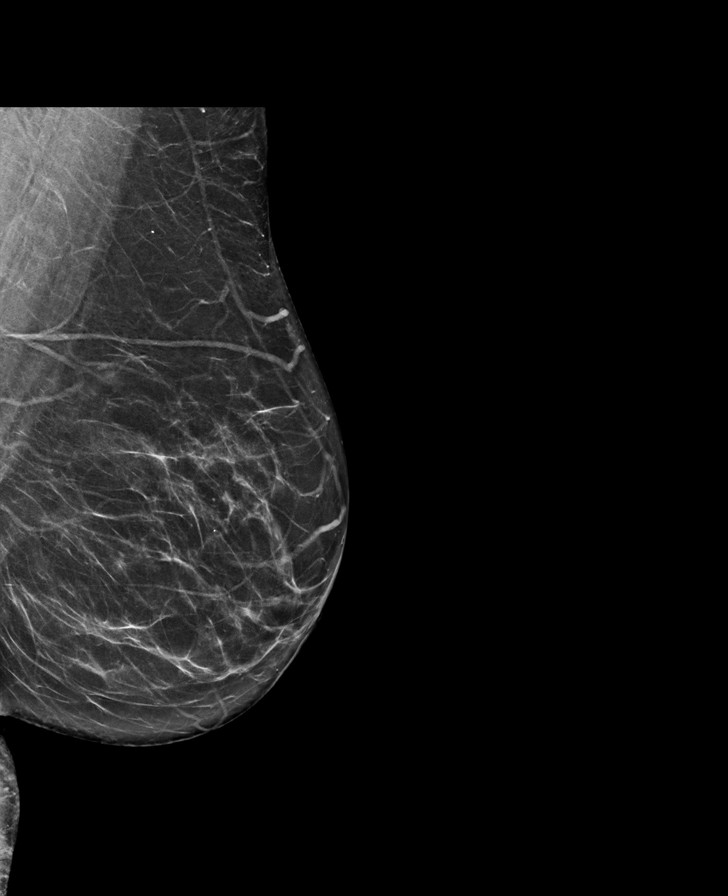

[L MLO synth-2D (2 of 2)]
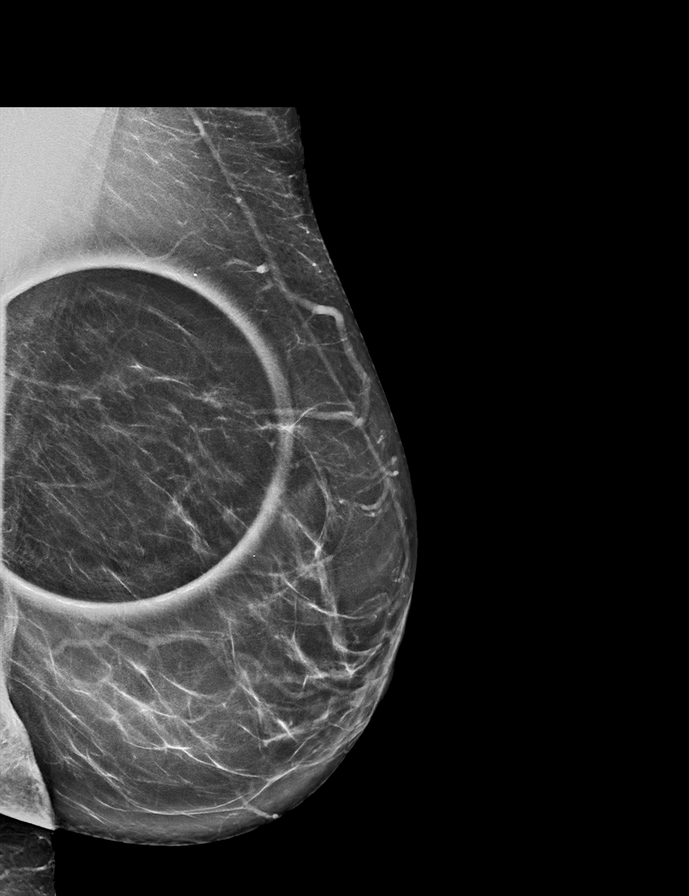

[R CC synth-2D]
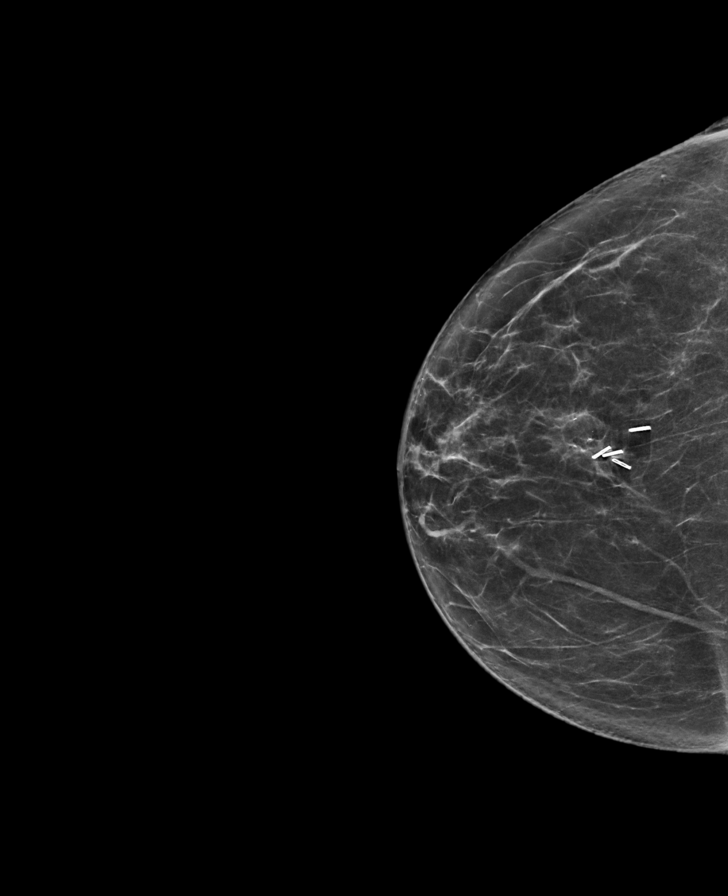

[L CC synth-2D]
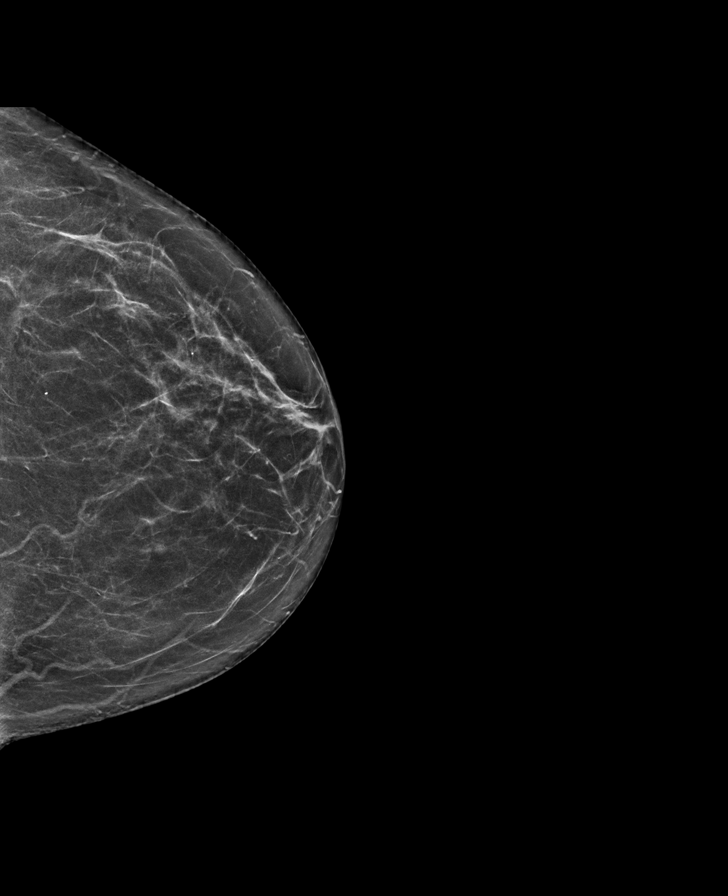

[L ML synth-2D]
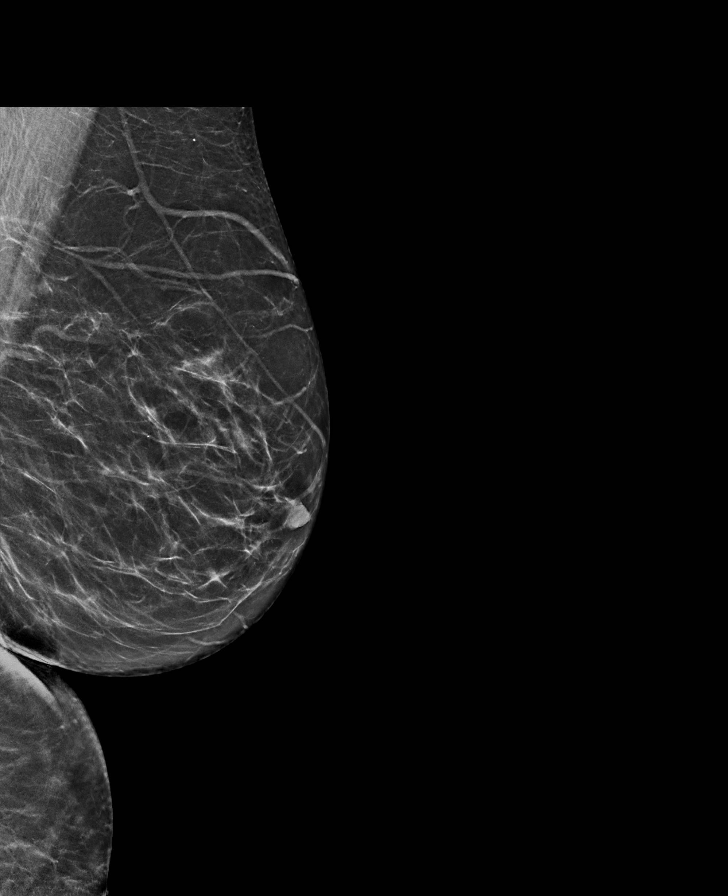

[R MLO synth-2D]
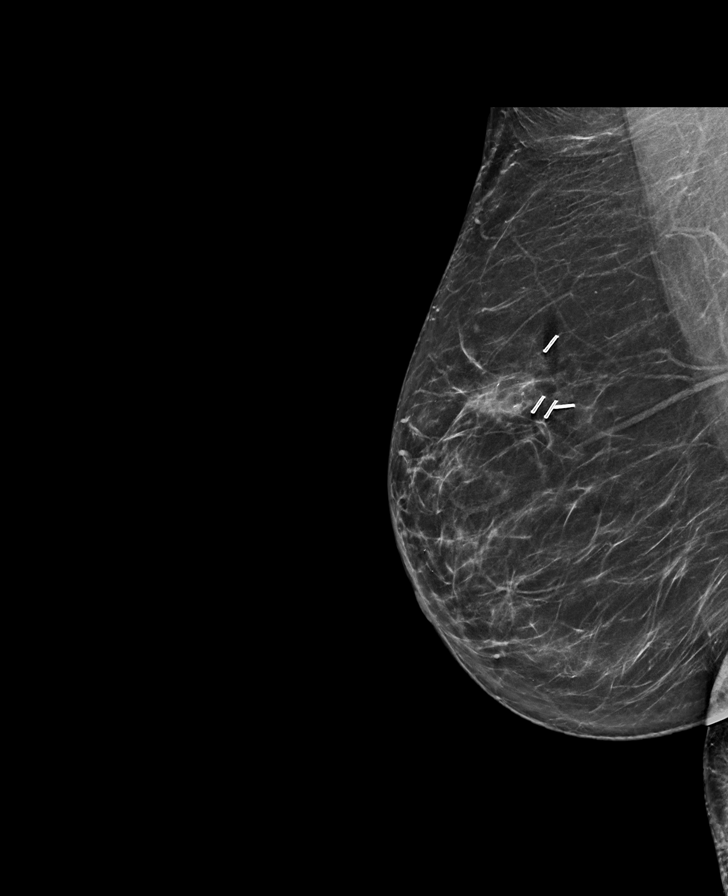

[R CC tomo · tomo slice 36/71.0]
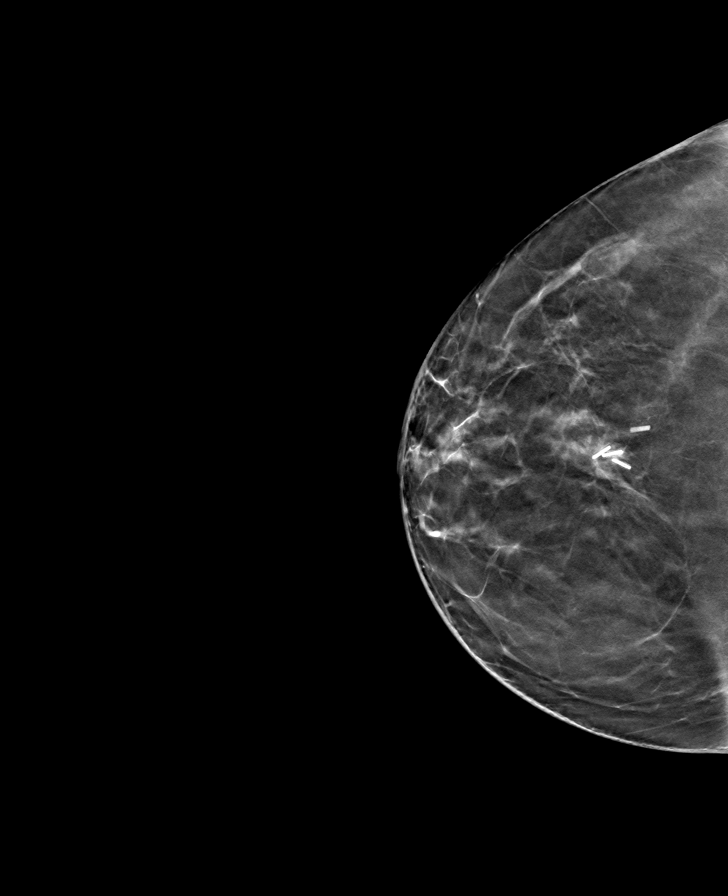

[8 of 37 positions shown; findings below may reference images not displayed]

ACR Breast Density Category b: There are scattered areas of
fibroglandular density.
FINDINGS: There are expected post lumpectomy changes in the right breast. No
suspicious mass, distortion, or microcalcifications are identified
to suggest presence of malignancy.

Mammographic images were processed with CAD.
IMPRESSION: No mammographic evidence of malignancy in the bilateral breast.
Expected post lumpectomy changes in the right breast.

RECOMMENDATION:
Diagnostic bilateral mammogram in 1 year.

I have discussed the findings and recommendations with the patient.
If applicable, a reminder letter will be sent to the patient
regarding the next appointment.

BI-RADS CATEGORY  2: Benign.

## 2020-04-01 ENCOUNTER — Ambulatory Visit
Admission: RE | Admit: 2020-04-01 | Discharge: 2020-04-01 | Disposition: A | Discharge status: Home / Self Care | Payer: Medicare Other | Source: Ambulatory Visit | Attending: Hematology and Oncology | Admitting: Hematology and Oncology

## 2020-04-01 ENCOUNTER — Other Ambulatory Visit: Payer: Self-pay | Admitting: Hematology and Oncology

## 2020-04-01 ENCOUNTER — Other Ambulatory Visit: Payer: Self-pay

## 2020-04-01 DIAGNOSIS — N6320 Unspecified lump in the left breast, unspecified quadrant: Secondary | ICD-10-CM

## 2020-04-01 DIAGNOSIS — Z9889 Other specified postprocedural states: Secondary | ICD-10-CM

## 2020-04-01 DIAGNOSIS — N6489 Other specified disorders of breast: Secondary | ICD-10-CM | POA: Diagnosis not present

## 2020-04-01 DIAGNOSIS — R928 Other abnormal and inconclusive findings on diagnostic imaging of breast: Secondary | ICD-10-CM | POA: Diagnosis not present

## 2020-04-29 DIAGNOSIS — Z79899 Other long term (current) drug therapy: Secondary | ICD-10-CM | POA: Diagnosis not present

## 2020-04-29 DIAGNOSIS — H401132 Primary open-angle glaucoma, bilateral, moderate stage: Secondary | ICD-10-CM | POA: Diagnosis not present

## 2020-04-29 DIAGNOSIS — H35033 Hypertensive retinopathy, bilateral: Secondary | ICD-10-CM | POA: Diagnosis not present

## 2020-04-29 DIAGNOSIS — H43813 Vitreous degeneration, bilateral: Secondary | ICD-10-CM | POA: Diagnosis not present

## 2020-05-14 DIAGNOSIS — L404 Guttate psoriasis: Secondary | ICD-10-CM | POA: Diagnosis not present

## 2020-05-15 DIAGNOSIS — L400 Psoriasis vulgaris: Secondary | ICD-10-CM | POA: Diagnosis not present

## 2020-05-18 DIAGNOSIS — L400 Psoriasis vulgaris: Secondary | ICD-10-CM | POA: Diagnosis not present

## 2020-05-20 DIAGNOSIS — L42 Pityriasis rosea: Secondary | ICD-10-CM | POA: Diagnosis not present

## 2020-05-22 DIAGNOSIS — L400 Psoriasis vulgaris: Secondary | ICD-10-CM | POA: Diagnosis not present

## 2020-05-25 DIAGNOSIS — L400 Psoriasis vulgaris: Secondary | ICD-10-CM | POA: Diagnosis not present

## 2020-05-25 NOTE — Progress Notes (Signed)
Patient Care Team: Cari Caraway, MD as PCP - General (Family Medicine) Minus Breeding, MD as PCP - Cardiology (Cardiology) Nicholas Lose, MD as Consulting Physician (Hematology and Oncology) Delice Bison, Charlestine Massed, NP as Nurse Practitioner (Hematology and Oncology) Erroll Luna, MD as Consulting Physician (General Surgery) Eppie Gibson, MD as Attending Physician (Radiation Oncology)  DIAGNOSIS:    ICD-10-CM   1. Malignant neoplasm of upper-outer quadrant of right breast in female, estrogen receptor positive ([place])  C50.411    Z17.0     SUMMARY OF ONCOLOGIC HISTORY: Oncology History  Malignant neoplasm of upper-outer quadrant of right breast in female, estrogen receptor positive ([place])  04/12/2017 Initial Diagnosis   right breast suspicious mass at 12 o'clock position 1.1 cm; left breast 1 o'clock position 0.6 cm   05/09/2017 Surgery   Right lumpectomy: IDC grade 1, 1.1 cm, low-grade DCIS, margins negative, 0/3 lymph nodes negative ER 100%, PR 100%, HER-2 negative ratio 1.26, Ki-67 1%, T1CN0 stage I a; left lumpectomy: Intraductal papilloma, ADH   05/2017 -  Anti-estrogen oral therapy   Anastrozole daily     CHIEF COMPLIANT: Follow-up of right breast cancer on anastrozole therapy  INTERVAL HISTORY: Christina Henderson is a 84 y.o. with above-mentioned history of right breast cancer treated with lumpectomy and who is currently on antiestrogen therapy with anastrozole. Mammogram on 04/01/20 showed no evidence of malignancy bilaterally. She presents to the clinic today for follow-up.   She is living at [place] and the have taken great care snowboarding COVID-19.  She has gotten her booster as well.  ALLERGIES:  is allergic to caffeine, statins, and sulfa antibiotics.  MEDICATIONS:  Current Outpatient Medications  Medication Sig Dispense Refill  . amLODipine (NORVASC) 2.5 MG tablet Take 1 tablet by mouth daily.    Marland Kitchen anastrozole (ARIMIDEX) 1 MG tablet Take 1 tablet (1  mg total) by mouth daily. 90 tablet 3  . COVID-19 mRNA vaccine, Moderna, 100 MCG/0.5ML SUSP Moderna COVID-19 Vaccine (PF) 100 mcg/0.5 mL intramuscular susp. (EUA)  PHARMACY ADMINISTERED    . diltiazem (CARDIZEM CD) 180 MG 24 hr capsule Take 1 capsule (180 mg total) by mouth daily. 90 capsule 3  . fluocinolone (VANOS) 0.01 % cream Apply topically 2 (two) times daily. 30 g 0  . hydrocortisone 2.5 % cream hydrocortisone 2.5 % topical cream    . hydroxychloroquine (PLAQUENIL) 200 MG tablet Take 1 tablet (200 mg total) by mouth daily.    Marland Kitchen ibandronate (BONIVA) 150 MG tablet Take 1 tablet by mouth every 30 (thirty) days.    Marland Kitchen losartan (COZAAR) 100 MG tablet Take 1 tablet (100 mg total) by mouth daily. 90 tablet 3  . Multiple Vitamin (MULTIVITAMIN WITH MINERALS) TABS tablet Take 1 tablet by mouth daily.    . Probiotic Product (PROBIOTIC ACIDOPHILUS BIOBEADS) CAPS Take by mouth daily.      No current facility-administered medications for this visit.    PHYSICAL EXAMINATION: ECOG PERFORMANCE STATUS: 1 - Symptomatic but completely ambulatory  Vitals:   05/26/20 1140  BP: (!) 164/67  Pulse: (!) 55  Resp: 17  Temp: 98.3 F (36.8 C)  SpO2: 96%   Filed Weights   05/26/20 1140  Weight: 146 lb 11.2 oz (66.5 kg)    BREAST: No palpable masses or nodules in either right or left breasts. No palpable axillary supraclavicular or infraclavicular adenopathy no breast tenderness or nipple discharge. (exam performed in the presence of a chaperone)  LABORATORY DATA:  I have reviewed the data as listed CMP  Latest Ref Rng & Units 05/03/2017  Glucose 65 - 99 mg/dL 88  BUN 6 - 20 mg/dL 24(H)  Creatinine 0.44 - 1.00 mg/dL 0.74  Sodium 135 - 145 mmol/L 140  Potassium 3.5 - 5.1 mmol/L 4.2  Chloride 101 - 111 mmol/L 105  CO2 22 - 32 mmol/L 28  Calcium 8.9 - 10.3 mg/dL 10.0  Total Protein 6.5 - 8.1 g/dL 7.1  Total Bilirubin 0.3 - 1.2 mg/dL 1.0  Alkaline Phos 38 - 126 U/L 63  AST 15 - 41 U/L 24  ALT 14 -  54 U/L 25    Lab Results  Component Value Date   WBC 6.7 05/03/2017   HGB 14.3 05/03/2017   HCT 43.9 05/03/2017   MCV 93.8 05/03/2017   PLT 233 05/03/2017   NEUTROABS 4.1 05/03/2017    ASSESSMENT & PLAN:  Malignant neoplasm of upper-outer quadrant of right breast in female, estrogen receptor positive ([place]) 05/09/2017:Right lumpectomy: IDC grade 1, 1.1 cm, low-grade DCIS, margins negative, 0/3 lymph nodes negative ER 100%, PR 100%, HER-2 negative ratio 1.26, Ki-67 1%, T1CN0 stage I a; left lumpectomy: Intraductal papilloma, ADH  Current treatment: Anastrozole 1 mg p.o. daily started 05/23/2017  Anastrozole toxicities:Denies any side effects. She has rheumatoid arthritis which causes her some joint aches and stiffness. She also has psoriasis for which she is receiving light therapy.  She is hoping that this will work to get rid of the psoriasis.  Breast cancer surveillance: 1.Mammogram 04/01/2020: Benign breast density category B 2.Breast exam  05/26/2020: Benign 3. Bone density 04/01/2020: T score -2.3: Osteopenia: She is not taking bisphosphonate therapy.  Return to clinic in 1 year for follow-up    No orders of the defined types were placed in this encounter.  The patient has a good understanding of the overall plan. she agrees with it. she will call with any problems that may develop before the next visit here.  Total time spent: 20 mins including face to face time and time spent for planning, charting and coordination of care  Nicholas Lose, MD 05/26/2020  I, Cloyde Reams Dorshimer, am acting as scribe for Dr. Nicholas Lose.  I have reviewed the above documentation for accuracy and completeness, and I agree with the above.

## 2020-05-26 ENCOUNTER — Other Ambulatory Visit: Payer: Self-pay

## 2020-05-26 ENCOUNTER — Inpatient Hospital Stay: Payer: Medicare Other | Attending: Hematology and Oncology | Admitting: Hematology and Oncology

## 2020-05-26 ENCOUNTER — Telehealth: Payer: Self-pay | Admitting: Hematology and Oncology

## 2020-05-26 DIAGNOSIS — M069 Rheumatoid arthritis, unspecified: Secondary | ICD-10-CM | POA: Diagnosis not present

## 2020-05-26 DIAGNOSIS — Z170 Estrogen receptor positive status [ER+]: Secondary | ICD-10-CM

## 2020-05-26 DIAGNOSIS — M8580 Other specified disorders of bone density and structure, unspecified site: Secondary | ICD-10-CM | POA: Insufficient documentation

## 2020-05-26 DIAGNOSIS — C50411 Malignant neoplasm of upper-outer quadrant of right female breast: Secondary | ICD-10-CM | POA: Diagnosis not present

## 2020-05-26 DIAGNOSIS — L409 Psoriasis, unspecified: Secondary | ICD-10-CM | POA: Diagnosis not present

## 2020-05-26 MED ORDER — ANASTROZOLE 1 MG PO TABS
1.0000 mg | ORAL_TABLET | Freq: Every day | ORAL | 3 refills | Status: DC
Start: 2020-05-26 — End: 2021-05-17

## 2020-05-26 MED ORDER — FOLIC ACID 1 MG PO TABS: 1.0000 mg | ORAL_TABLET | Freq: Every day | ORAL | Status: DC

## 2020-05-26 MED ORDER — HYDROXYCHLOROQUINE SULFATE 200 MG PO TABS: 400.0000 mg | ORAL_TABLET | Freq: Every day | ORAL | Status: AC

## 2020-05-26 NOTE — Assessment & Plan Note (Signed)
05/09/2017:Right lumpectomy: IDC grade 1, 1.1 cm, low-grade DCIS, margins negative, 0/3 lymph nodes negative ER 100%, PR 100%, HER-2 negative ratio 1.26, Ki-67 1%, T1CN0 stage I a; left lumpectomy: Intraductal papilloma, ADH  Current treatment: Anastrozole 1 mg p.o. daily started 05/23/2017  Anastrozole toxicities:Denies any side effects. She has rheumatoid arthritis which causes her some joint aches and stiffness.  Breast cancer surveillance: 1.Mammogram 04/01/2020: Benign breast density category B 2.Breast exam  05/26/2020: Benign 3. Bone density 04/01/2020: T score -2.3: Osteopenia: Recommended bisphosphonate therapy  Return to clinic in 1 year for follow-up

## 2020-05-26 NOTE — Telephone Encounter (Signed)
Scheduled appts per 11/16 los. Pt declined print out of AVS and stated she would refer to mychart.

## 2020-05-27 DIAGNOSIS — L42 Pityriasis rosea: Secondary | ICD-10-CM | POA: Diagnosis not present

## 2020-05-28 DIAGNOSIS — R413 Other amnesia: Secondary | ICD-10-CM | POA: Diagnosis not present

## 2020-05-28 DIAGNOSIS — I10 Essential (primary) hypertension: Secondary | ICD-10-CM | POA: Diagnosis not present

## 2020-05-28 DIAGNOSIS — R519 Headache, unspecified: Secondary | ICD-10-CM | POA: Diagnosis not present

## 2020-05-28 DIAGNOSIS — E785 Hyperlipidemia, unspecified: Secondary | ICD-10-CM | POA: Diagnosis not present

## 2020-05-29 DIAGNOSIS — L400 Psoriasis vulgaris: Secondary | ICD-10-CM | POA: Diagnosis not present

## 2020-06-01 DIAGNOSIS — L42 Pityriasis rosea: Secondary | ICD-10-CM | POA: Diagnosis not present

## 2020-06-03 DIAGNOSIS — L42 Pityriasis rosea: Secondary | ICD-10-CM | POA: Diagnosis not present

## 2020-06-08 DIAGNOSIS — L42 Pityriasis rosea: Secondary | ICD-10-CM | POA: Diagnosis not present

## 2020-06-10 DIAGNOSIS — L42 Pityriasis rosea: Secondary | ICD-10-CM | POA: Diagnosis not present

## 2020-06-12 DIAGNOSIS — L42 Pityriasis rosea: Secondary | ICD-10-CM | POA: Diagnosis not present

## 2020-06-15 DIAGNOSIS — L42 Pityriasis rosea: Secondary | ICD-10-CM | POA: Diagnosis not present

## 2020-06-17 DIAGNOSIS — L42 Pityriasis rosea: Secondary | ICD-10-CM | POA: Diagnosis not present

## 2020-06-22 DIAGNOSIS — L400 Psoriasis vulgaris: Secondary | ICD-10-CM | POA: Diagnosis not present

## 2020-06-24 DIAGNOSIS — L42 Pityriasis rosea: Secondary | ICD-10-CM | POA: Diagnosis not present

## 2020-06-26 DIAGNOSIS — L400 Psoriasis vulgaris: Secondary | ICD-10-CM | POA: Diagnosis not present

## 2020-06-29 DIAGNOSIS — L42 Pityriasis rosea: Secondary | ICD-10-CM | POA: Diagnosis not present

## 2020-07-01 DIAGNOSIS — L42 Pityriasis rosea: Secondary | ICD-10-CM | POA: Diagnosis not present

## 2020-07-06 DIAGNOSIS — M150 Primary generalized (osteo)arthritis: Secondary | ICD-10-CM | POA: Diagnosis not present

## 2020-07-06 DIAGNOSIS — Z79899 Other long term (current) drug therapy: Secondary | ICD-10-CM | POA: Diagnosis not present

## 2020-07-06 DIAGNOSIS — E663 Overweight: Secondary | ICD-10-CM | POA: Diagnosis not present

## 2020-07-06 DIAGNOSIS — L409 Psoriasis, unspecified: Secondary | ICD-10-CM | POA: Diagnosis not present

## 2020-07-06 DIAGNOSIS — M0579 Rheumatoid arthritis with rheumatoid factor of multiple sites without organ or systems involvement: Secondary | ICD-10-CM | POA: Diagnosis not present

## 2020-07-06 DIAGNOSIS — Z6827 Body mass index (BMI) 27.0-27.9, adult: Secondary | ICD-10-CM | POA: Diagnosis not present

## 2020-07-06 DIAGNOSIS — L400 Psoriasis vulgaris: Secondary | ICD-10-CM | POA: Diagnosis not present

## 2020-07-06 DIAGNOSIS — M2550 Pain in unspecified joint: Secondary | ICD-10-CM | POA: Diagnosis not present

## 2020-07-08 DIAGNOSIS — L42 Pityriasis rosea: Secondary | ICD-10-CM | POA: Diagnosis not present

## 2020-07-16 NOTE — Progress Notes (Signed)
Cardiology Office Note   Date:  07/17/2020   ID:  Christina Henderson, DOB 01-03-1936, MRN [id number]  PCP:  Cari Caraway, MD  Cardiologist:   Minus Breeding, MD  Chief Complaint  Patient presents with  . Headache      History of Present Illness: Christina Henderson is a 85 y.o. female who who presents for follow up of chest pain.   She had a negative POET (Plain Old Exercise Treadmill) in 2019.  Since I last saw her she had a couple of episodes of the pain in her Z.  It is sharp.  It was fleeting.  This happened November.  No other associated symptoms.  She had not had this before.  She did feel palpitations.  She did know what her blood pressure was at that time but she is not noted it to be extraordinarily high.  She has not had any chest pressure, neck or arm discomfort.  She has not had any new shortness of breath, PND or orthopnea.  She exercises routinely swims 3 times a day.  With this she has no cardiovascular symptoms.   Past Medical History:  Diagnosis Date  . Arthritis    rhuematoid arthritis  . Breast cancer ([place]) 2018   Right Breast Cancer  . Cancer ([place]) 03/2017   right breast cancer  . Complication of anesthesia    hard to wake up  . GERD (gastroesophageal reflux disease)    food related  . Glaucoma   . Hypertension   . Osteoporosis    osteopenia  . Rosacea     Past Surgical History:  Procedure Laterality Date  . APPENDECTOMY    . BACK SURGERY    . BREAST BIOPSY    . BREAST LUMPECTOMY Right 04/2017  . BREAST LUMPECTOMY WITH RADIOACTIVE SEED AND SENTINEL LYMPH NODE BIOPSY Bilateral 05/09/2017   Procedure: BILATERAL RADIOACTIVE SEED GUIDED LUMPECTOMIES WITH RIGHT SENTINEL LYMPH NODE BIOPSY;  Surgeon: Erroll Luna, MD;  Location: [facility];  Service: General;  Laterality: Bilateral;  . EYE SURGERY     cataract  . SPINE SURGERY     lumbar lamenectomy  . TONSILLECTOMY       Current Outpatient Medications  Medication Sig  Dispense Refill  . amLODipine (NORVASC) 2.5 MG tablet Take 1 tablet by mouth daily.    Marland Kitchen anastrozole (ARIMIDEX) 1 MG tablet Take 1 tablet (1 mg total) by mouth daily. 90 tablet 3  . COVID-19 mRNA vaccine, Moderna, 100 MCG/0.5ML SUSP Moderna COVID-19 Vaccine (PF) 100 mcg/0.5 mL intramuscular susp. (EUA)  PHARMACY ADMINISTERED    . diltiazem (DILTIAZEM CD) 180 MG 24 hr capsule Take 180 mg by mouth daily.    . folic acid (FOLVITE) 1 MG tablet Take 1 tablet (1 mg total) by mouth daily.    . hydroxychloroquine (PLAQUENIL) 200 MG tablet Take 2 tablets (400 mg total) by mouth daily.    Marland Kitchen ibandronate (BONIVA) 150 MG tablet Take 1 tablet by mouth every 30 (thirty) days.    Marland Kitchen losartan (COZAAR) 100 MG tablet Take 1 tablet (100 mg total) by mouth daily. 90 tablet 3  . Multiple Vitamin (MULTIVITAMIN WITH MINERALS) TABS tablet Take 1 tablet by mouth daily.    . Probiotic Product (PROBIOTIC ACIDOPHILUS BIOBEADS) CAPS Take by mouth daily.     . rosuvastatin (CRESTOR) 10 MG tablet Take 10 mg by mouth daily.     No current facility-administered medications for this visit.    Allergies:  Caffeine, Statins, and Sulfa antibiotics    ROS:  Please see the history of present illness.   Otherwise, review of systems are positive for none.   All other systems are reviewed and negative.    PHYSICAL EXAM: VS:  BP 138/60 (BP Location: Left Arm, Patient Position: Sitting, Cuff Size: Normal)   Pulse 60   Ht 5' 1.5" (1.562 m)   Wt 148 lb (67.1 kg)   BMI 27.51 kg/m  , BMI Body mass index is 27.51 kg/m. GENERAL:  Well appearing NECK:  No jugular venous distention, waveform within normal limits, carotid upstroke brisk and symmetric, no bruits, no thyromegaly LUNGS:  Clear to auscultation bilaterally CHEST:  Unremarkable HEART:  PMI not displaced or sustained,S1 and S2 within normal limits, no S3, no S4, no clicks, no rubs, no murmurs ABD:  Flat, positive bowel sounds normal in frequency in pitch, no bruits, no  rebound, no guarding, no midline pulsatile mass, no hepatomegaly, no splenomegaly EXT:  2 plus pulses throughout, no edema, no cyanosis no clubbing  EKG:  EKG is ordered today. The ekg ordered today demonstrates sinus rhythm, rate 60, axis within normal limits, intervals within normal limits, no acute ST-T wave changes.   Recent Labs: No results found for requested labs within last 8760 hours.    Lipid Panel No results found for: CHOL, TRIG, HDL, CHOLHDL, VLDL, LDLCALC, LDLDIRECT    Wt Readings from Last 3 Encounters:  07/17/20 148 lb (67.1 kg)  05/26/20 146 lb 11.2 oz (66.5 kg)  11/26/19 150 lb 9.6 oz (68.3 kg)      Other studies Reviewed: Additional studies/ records that were reviewed today include: Primary care office records. Review of the above records demonstrates:  Please see elsewhere in the note.     ASSESSMENT AND PLAN:  HEAD PAIN: This was unusual and she has not had any further symptoms.  No further work-up is suggested.   HTN: Her blood pressure isat target.  No change in therapy.   DYSLIPIDEMIA: There was some mention of her not being on statin.  However, her LDL was 173 and she reports that she is taking her Crestor and tolerating it.  I will defer follow-up of her lipids to Gweneth Dimitri, MD.  She had question of some symptoms related to the statin from the notes that I can see but again seems to be tolerating it currently.  Current medicines are reviewed at length with the patient today.  The patient does not have concerns regarding medicines.  The following changes have been made:  no change  Labs/ tests ordered today include: None  Orders Placed This Encounter  Procedures  . EKG 12-Lead     Disposition:   FU with me in one year or sooner if needed   Signed, Rollene Rotunda, MD  07/17/2020 5:26 PM    [facility]

## 2020-07-17 ENCOUNTER — Ambulatory Visit (INDEPENDENT_AMBULATORY_CARE_PROVIDER_SITE_OTHER): Payer: Medicare Other | Admitting: Cardiology

## 2020-07-17 ENCOUNTER — Other Ambulatory Visit: Payer: Self-pay

## 2020-07-17 ENCOUNTER — Encounter: Payer: Self-pay | Admitting: Cardiology

## 2020-07-17 VITALS — BP 138/60 | HR 60 | Ht 61.5 in | Wt 148.0 lb

## 2020-07-17 DIAGNOSIS — R072 Precordial pain: Secondary | ICD-10-CM

## 2020-07-17 DIAGNOSIS — I10 Essential (primary) hypertension: Secondary | ICD-10-CM | POA: Diagnosis not present

## 2020-07-17 DIAGNOSIS — L400 Psoriasis vulgaris: Secondary | ICD-10-CM | POA: Diagnosis not present

## 2020-07-17 DIAGNOSIS — E785 Hyperlipidemia, unspecified: Secondary | ICD-10-CM

## 2020-07-17 NOTE — Patient Instructions (Addendum)
Medication Instructions:  Your physician recommends that you continue on your current medications as directed. Please refer to the Current Medication list given to you today.  *If you need a refill on your cardiac medications before your next appointment, please call your pharmacy*  Lab Work: none  Testing/Procedures: none  Follow-Up: At [facility], you and your health needs are our priority.  As part of our continuing mission to provide you with exceptional heart care, we have created designated Provider Care Teams.  These Care Teams include your primary Cardiologist (physician) and Advanced Practice Providers (APPs -  Physician Assistants and Nurse Practitioners) who all work together to provide you with the care you need, when you need it.  We recommend signing up for the patient portal called "MyChart".  Sign up information is provided on this After Visit Summary.  MyChart is used to connect with patients for Virtual Visits (Telemedicine).  Patients are able to view lab/test results, encounter notes, upcoming appointments, etc.  Non-urgent messages can be sent to your provider as well.   To learn more about what you can do with MyChart, go to [email or web address].    Your next appointment:   12 month(s)  You will receive a reminder letter in the mail two months in advance. If you don't receive a letter, please call our office to schedule the follow-up appointment.  The format for your next appointment:   In Person  Provider:   You may see Minus Breeding, MD or one of the following Advanced Practice Providers on your designated Care Team:    Rosaria Ferries, PA-C  Jory Sims, DNP, ANP

## 2020-07-20 DIAGNOSIS — L42 Pityriasis rosea: Secondary | ICD-10-CM | POA: Diagnosis not present

## 2020-07-24 DIAGNOSIS — L42 Pityriasis rosea: Secondary | ICD-10-CM | POA: Diagnosis not present

## 2020-07-30 DIAGNOSIS — H401132 Primary open-angle glaucoma, bilateral, moderate stage: Secondary | ICD-10-CM | POA: Diagnosis not present

## 2020-07-30 DIAGNOSIS — H43813 Vitreous degeneration, bilateral: Secondary | ICD-10-CM | POA: Diagnosis not present

## 2020-07-30 DIAGNOSIS — H52223 Regular astigmatism, bilateral: Secondary | ICD-10-CM | POA: Diagnosis not present

## 2020-07-30 DIAGNOSIS — Z79899 Other long term (current) drug therapy: Secondary | ICD-10-CM | POA: Diagnosis not present

## 2020-07-30 DIAGNOSIS — H04123 Dry eye syndrome of bilateral lacrimal glands: Secondary | ICD-10-CM | POA: Diagnosis not present

## 2020-07-30 DIAGNOSIS — H5213 Myopia, bilateral: Secondary | ICD-10-CM | POA: Diagnosis not present

## 2020-07-30 DIAGNOSIS — H35033 Hypertensive retinopathy, bilateral: Secondary | ICD-10-CM | POA: Diagnosis not present

## 2020-08-03 DIAGNOSIS — L42 Pityriasis rosea: Secondary | ICD-10-CM | POA: Diagnosis not present

## 2020-08-05 DIAGNOSIS — L42 Pityriasis rosea: Secondary | ICD-10-CM | POA: Diagnosis not present

## 2020-08-07 DIAGNOSIS — L42 Pityriasis rosea: Secondary | ICD-10-CM | POA: Diagnosis not present

## 2020-08-10 DIAGNOSIS — L42 Pityriasis rosea: Secondary | ICD-10-CM | POA: Diagnosis not present

## 2020-08-12 DIAGNOSIS — M542 Cervicalgia: Secondary | ICD-10-CM | POA: Diagnosis not present

## 2020-08-14 DIAGNOSIS — L42 Pityriasis rosea: Secondary | ICD-10-CM | POA: Diagnosis not present

## 2020-08-19 DIAGNOSIS — L42 Pityriasis rosea: Secondary | ICD-10-CM | POA: Diagnosis not present

## 2020-08-21 DIAGNOSIS — L42 Pityriasis rosea: Secondary | ICD-10-CM | POA: Diagnosis not present

## 2020-08-26 DIAGNOSIS — L659 Nonscarring hair loss, unspecified: Secondary | ICD-10-CM | POA: Diagnosis not present

## 2020-08-26 DIAGNOSIS — L404 Guttate psoriasis: Secondary | ICD-10-CM | POA: Diagnosis not present

## 2020-08-26 DIAGNOSIS — L400 Psoriasis vulgaris: Secondary | ICD-10-CM | POA: Diagnosis not present

## 2020-08-28 DIAGNOSIS — L400 Psoriasis vulgaris: Secondary | ICD-10-CM | POA: Diagnosis not present

## 2020-08-31 DIAGNOSIS — L400 Psoriasis vulgaris: Secondary | ICD-10-CM | POA: Diagnosis not present

## 2020-09-02 DIAGNOSIS — L400 Psoriasis vulgaris: Secondary | ICD-10-CM | POA: Diagnosis not present

## 2020-09-04 DIAGNOSIS — L400 Psoriasis vulgaris: Secondary | ICD-10-CM | POA: Diagnosis not present

## 2020-09-07 DIAGNOSIS — L400 Psoriasis vulgaris: Secondary | ICD-10-CM | POA: Diagnosis not present

## 2020-09-09 DIAGNOSIS — L400 Psoriasis vulgaris: Secondary | ICD-10-CM | POA: Diagnosis not present

## 2020-09-14 DIAGNOSIS — L400 Psoriasis vulgaris: Secondary | ICD-10-CM | POA: Diagnosis not present

## 2020-09-16 DIAGNOSIS — L400 Psoriasis vulgaris: Secondary | ICD-10-CM | POA: Diagnosis not present

## 2020-09-18 DIAGNOSIS — L400 Psoriasis vulgaris: Secondary | ICD-10-CM | POA: Diagnosis not present

## 2020-09-21 DIAGNOSIS — L400 Psoriasis vulgaris: Secondary | ICD-10-CM | POA: Diagnosis not present

## 2020-09-23 DIAGNOSIS — L400 Psoriasis vulgaris: Secondary | ICD-10-CM | POA: Diagnosis not present

## 2020-09-24 DIAGNOSIS — I10 Essential (primary) hypertension: Secondary | ICD-10-CM | POA: Diagnosis not present

## 2020-09-24 DIAGNOSIS — M7662 Achilles tendinitis, left leg: Secondary | ICD-10-CM | POA: Diagnosis not present

## 2020-09-24 DIAGNOSIS — R413 Other amnesia: Secondary | ICD-10-CM | POA: Diagnosis not present

## 2020-09-24 DIAGNOSIS — M85852 Other specified disorders of bone density and structure, left thigh: Secondary | ICD-10-CM | POA: Diagnosis not present

## 2020-09-24 DIAGNOSIS — M069 Rheumatoid arthritis, unspecified: Secondary | ICD-10-CM | POA: Diagnosis not present

## 2020-09-24 DIAGNOSIS — R7989 Other specified abnormal findings of blood chemistry: Secondary | ICD-10-CM | POA: Diagnosis not present

## 2020-09-24 DIAGNOSIS — K219 Gastro-esophageal reflux disease without esophagitis: Secondary | ICD-10-CM | POA: Diagnosis not present

## 2020-09-24 DIAGNOSIS — Z0000 Encounter for general adult medical examination without abnormal findings: Secondary | ICD-10-CM | POA: Diagnosis not present

## 2020-09-24 DIAGNOSIS — Z1389 Encounter for screening for other disorder: Secondary | ICD-10-CM | POA: Diagnosis not present

## 2020-09-24 DIAGNOSIS — M1990 Unspecified osteoarthritis, unspecified site: Secondary | ICD-10-CM | POA: Diagnosis not present

## 2020-09-24 DIAGNOSIS — Z79899 Other long term (current) drug therapy: Secondary | ICD-10-CM | POA: Diagnosis not present

## 2020-09-24 DIAGNOSIS — E785 Hyperlipidemia, unspecified: Secondary | ICD-10-CM | POA: Diagnosis not present

## 2020-09-25 DIAGNOSIS — L400 Psoriasis vulgaris: Secondary | ICD-10-CM | POA: Diagnosis not present

## 2020-09-28 DIAGNOSIS — Z79899 Other long term (current) drug therapy: Secondary | ICD-10-CM | POA: Diagnosis not present

## 2020-09-28 DIAGNOSIS — K219 Gastro-esophageal reflux disease without esophagitis: Secondary | ICD-10-CM | POA: Diagnosis not present

## 2020-09-28 DIAGNOSIS — M069 Rheumatoid arthritis, unspecified: Secondary | ICD-10-CM | POA: Diagnosis not present

## 2020-09-28 DIAGNOSIS — Z0000 Encounter for general adult medical examination without abnormal findings: Secondary | ICD-10-CM | POA: Diagnosis not present

## 2020-09-28 DIAGNOSIS — I10 Essential (primary) hypertension: Secondary | ICD-10-CM | POA: Diagnosis not present

## 2020-09-28 DIAGNOSIS — D0511 Intraductal carcinoma in situ of right breast: Secondary | ICD-10-CM | POA: Diagnosis not present

## 2020-09-28 DIAGNOSIS — M85852 Other specified disorders of bone density and structure, left thigh: Secondary | ICD-10-CM | POA: Diagnosis not present

## 2020-09-28 DIAGNOSIS — E785 Hyperlipidemia, unspecified: Secondary | ICD-10-CM | POA: Diagnosis not present

## 2020-09-28 DIAGNOSIS — M1990 Unspecified osteoarthritis, unspecified site: Secondary | ICD-10-CM | POA: Diagnosis not present

## 2020-09-28 DIAGNOSIS — H8111 Benign paroxysmal vertigo, right ear: Secondary | ICD-10-CM | POA: Diagnosis not present

## 2020-09-30 DIAGNOSIS — L400 Psoriasis vulgaris: Secondary | ICD-10-CM | POA: Diagnosis not present

## 2020-10-02 DIAGNOSIS — L400 Psoriasis vulgaris: Secondary | ICD-10-CM | POA: Diagnosis not present

## 2020-10-07 DIAGNOSIS — L400 Psoriasis vulgaris: Secondary | ICD-10-CM | POA: Diagnosis not present

## 2020-10-09 DIAGNOSIS — L400 Psoriasis vulgaris: Secondary | ICD-10-CM | POA: Diagnosis not present

## 2020-10-12 DIAGNOSIS — L400 Psoriasis vulgaris: Secondary | ICD-10-CM | POA: Diagnosis not present

## 2020-10-14 DIAGNOSIS — D225 Melanocytic nevi of trunk: Secondary | ICD-10-CM | POA: Diagnosis not present

## 2020-10-14 DIAGNOSIS — L404 Guttate psoriasis: Secondary | ICD-10-CM | POA: Diagnosis not present

## 2020-10-14 DIAGNOSIS — L400 Psoriasis vulgaris: Secondary | ICD-10-CM | POA: Diagnosis not present

## 2020-10-16 DIAGNOSIS — L400 Psoriasis vulgaris: Secondary | ICD-10-CM | POA: Diagnosis not present

## 2020-10-26 DIAGNOSIS — L400 Psoriasis vulgaris: Secondary | ICD-10-CM | POA: Diagnosis not present

## 2020-10-28 DIAGNOSIS — L400 Psoriasis vulgaris: Secondary | ICD-10-CM | POA: Diagnosis not present

## 2020-10-30 DIAGNOSIS — L400 Psoriasis vulgaris: Secondary | ICD-10-CM | POA: Diagnosis not present

## 2020-11-02 DIAGNOSIS — L400 Psoriasis vulgaris: Secondary | ICD-10-CM | POA: Diagnosis not present

## 2020-11-06 DIAGNOSIS — Z23 Encounter for immunization: Secondary | ICD-10-CM | POA: Diagnosis not present

## 2020-11-06 DIAGNOSIS — L400 Psoriasis vulgaris: Secondary | ICD-10-CM | POA: Diagnosis not present

## 2020-11-09 DIAGNOSIS — L400 Psoriasis vulgaris: Secondary | ICD-10-CM | POA: Diagnosis not present

## 2020-11-11 DIAGNOSIS — L400 Psoriasis vulgaris: Secondary | ICD-10-CM | POA: Diagnosis not present

## 2020-11-16 DIAGNOSIS — L400 Psoriasis vulgaris: Secondary | ICD-10-CM | POA: Diagnosis not present

## 2020-11-18 DIAGNOSIS — L400 Psoriasis vulgaris: Secondary | ICD-10-CM | POA: Diagnosis not present

## 2020-11-20 DIAGNOSIS — L400 Psoriasis vulgaris: Secondary | ICD-10-CM | POA: Diagnosis not present

## 2020-11-23 DIAGNOSIS — L404 Guttate psoriasis: Secondary | ICD-10-CM | POA: Diagnosis not present

## 2020-12-28 ENCOUNTER — Other Ambulatory Visit: Payer: Self-pay | Admitting: Cardiology

## 2020-12-28 DIAGNOSIS — M79641 Pain in right hand: Secondary | ICD-10-CM | POA: Diagnosis not present

## 2020-12-28 DIAGNOSIS — Z6826 Body mass index (BMI) 26.0-26.9, adult: Secondary | ICD-10-CM | POA: Diagnosis not present

## 2020-12-28 DIAGNOSIS — M0579 Rheumatoid arthritis with rheumatoid factor of multiple sites without organ or systems involvement: Secondary | ICD-10-CM | POA: Diagnosis not present

## 2020-12-28 DIAGNOSIS — M79642 Pain in left hand: Secondary | ICD-10-CM | POA: Diagnosis not present

## 2020-12-28 DIAGNOSIS — Z79899 Other long term (current) drug therapy: Secondary | ICD-10-CM | POA: Diagnosis not present

## 2020-12-28 DIAGNOSIS — E663 Overweight: Secondary | ICD-10-CM | POA: Diagnosis not present

## 2020-12-28 DIAGNOSIS — M150 Primary generalized (osteo)arthritis: Secondary | ICD-10-CM | POA: Diagnosis not present

## 2020-12-28 DIAGNOSIS — L409 Psoriasis, unspecified: Secondary | ICD-10-CM | POA: Diagnosis not present

## 2020-12-30 DIAGNOSIS — H35033 Hypertensive retinopathy, bilateral: Secondary | ICD-10-CM | POA: Diagnosis not present

## 2020-12-30 DIAGNOSIS — H401132 Primary open-angle glaucoma, bilateral, moderate stage: Secondary | ICD-10-CM | POA: Diagnosis not present

## 2020-12-30 DIAGNOSIS — M0570 Rheumatoid arthritis with rheumatoid factor of unspecified site without organ or systems involvement: Secondary | ICD-10-CM | POA: Diagnosis not present

## 2020-12-30 DIAGNOSIS — Z79899 Other long term (current) drug therapy: Secondary | ICD-10-CM | POA: Diagnosis not present

## 2020-12-31 DIAGNOSIS — M069 Rheumatoid arthritis, unspecified: Secondary | ICD-10-CM | POA: Diagnosis not present

## 2020-12-31 DIAGNOSIS — H8111 Benign paroxysmal vertigo, right ear: Secondary | ICD-10-CM | POA: Diagnosis not present

## 2020-12-31 DIAGNOSIS — M1990 Unspecified osteoarthritis, unspecified site: Secondary | ICD-10-CM | POA: Diagnosis not present

## 2020-12-31 DIAGNOSIS — I10 Essential (primary) hypertension: Secondary | ICD-10-CM | POA: Diagnosis not present

## 2020-12-31 DIAGNOSIS — E785 Hyperlipidemia, unspecified: Secondary | ICD-10-CM | POA: Diagnosis not present

## 2020-12-31 DIAGNOSIS — Z79899 Other long term (current) drug therapy: Secondary | ICD-10-CM | POA: Diagnosis not present

## 2020-12-31 DIAGNOSIS — M85852 Other specified disorders of bone density and structure, left thigh: Secondary | ICD-10-CM | POA: Diagnosis not present

## 2020-12-31 DIAGNOSIS — Z0000 Encounter for general adult medical examination without abnormal findings: Secondary | ICD-10-CM | POA: Diagnosis not present

## 2020-12-31 DIAGNOSIS — D0511 Intraductal carcinoma in situ of right breast: Secondary | ICD-10-CM | POA: Diagnosis not present

## 2020-12-31 DIAGNOSIS — K219 Gastro-esophageal reflux disease without esophagitis: Secondary | ICD-10-CM | POA: Diagnosis not present

## 2021-02-17 ENCOUNTER — Other Ambulatory Visit: Payer: Self-pay | Admitting: Hematology and Oncology

## 2021-02-17 DIAGNOSIS — Z853 Personal history of malignant neoplasm of breast: Secondary | ICD-10-CM

## 2021-02-17 DIAGNOSIS — Z9889 Other specified postprocedural states: Secondary | ICD-10-CM

## 2021-03-17 DIAGNOSIS — M7662 Achilles tendinitis, left leg: Secondary | ICD-10-CM | POA: Diagnosis not present

## 2021-03-22 DIAGNOSIS — M7662 Achilles tendinitis, left leg: Secondary | ICD-10-CM | POA: Diagnosis not present

## 2021-03-31 DIAGNOSIS — Z1389 Encounter for screening for other disorder: Secondary | ICD-10-CM | POA: Diagnosis not present

## 2021-03-31 DIAGNOSIS — Z0000 Encounter for general adult medical examination without abnormal findings: Secondary | ICD-10-CM | POA: Diagnosis not present

## 2021-04-06 DIAGNOSIS — R202 Paresthesia of skin: Secondary | ICD-10-CM | POA: Diagnosis not present

## 2021-04-06 DIAGNOSIS — E785 Hyperlipidemia, unspecified: Secondary | ICD-10-CM | POA: Diagnosis not present

## 2021-04-07 ENCOUNTER — Other Ambulatory Visit: Payer: Self-pay

## 2021-04-07 ENCOUNTER — Other Ambulatory Visit: Payer: Self-pay | Admitting: Hematology and Oncology

## 2021-04-07 ENCOUNTER — Ambulatory Visit
Admission: RE | Admit: 2021-04-07 | Discharge: 2021-04-07 | Disposition: A | Discharge status: Home / Self Care | Payer: Medicare Other | Source: Ambulatory Visit | Attending: Hematology and Oncology | Admitting: Hematology and Oncology

## 2021-04-07 DIAGNOSIS — Z1231 Encounter for screening mammogram for malignant neoplasm of breast: Secondary | ICD-10-CM | POA: Diagnosis not present

## 2021-04-07 DIAGNOSIS — Z9889 Other specified postprocedural states: Secondary | ICD-10-CM

## 2021-04-07 DIAGNOSIS — Z853 Personal history of malignant neoplasm of breast: Secondary | ICD-10-CM

## 2021-04-09 DIAGNOSIS — M069 Rheumatoid arthritis, unspecified: Secondary | ICD-10-CM | POA: Diagnosis not present

## 2021-04-09 DIAGNOSIS — D0511 Intraductal carcinoma in situ of right breast: Secondary | ICD-10-CM | POA: Diagnosis not present

## 2021-04-09 DIAGNOSIS — M85852 Other specified disorders of bone density and structure, left thigh: Secondary | ICD-10-CM | POA: Diagnosis not present

## 2021-04-09 DIAGNOSIS — Z79899 Other long term (current) drug therapy: Secondary | ICD-10-CM | POA: Diagnosis not present

## 2021-04-09 DIAGNOSIS — M1990 Unspecified osteoarthritis, unspecified site: Secondary | ICD-10-CM | POA: Diagnosis not present

## 2021-04-09 DIAGNOSIS — I10 Essential (primary) hypertension: Secondary | ICD-10-CM | POA: Diagnosis not present

## 2021-04-09 DIAGNOSIS — Z23 Encounter for immunization: Secondary | ICD-10-CM | POA: Diagnosis not present

## 2021-04-09 DIAGNOSIS — D126 Benign neoplasm of colon, unspecified: Secondary | ICD-10-CM | POA: Diagnosis not present

## 2021-04-09 DIAGNOSIS — K219 Gastro-esophageal reflux disease without esophagitis: Secondary | ICD-10-CM | POA: Diagnosis not present

## 2021-04-09 DIAGNOSIS — R202 Paresthesia of skin: Secondary | ICD-10-CM | POA: Diagnosis not present

## 2021-04-09 DIAGNOSIS — E785 Hyperlipidemia, unspecified: Secondary | ICD-10-CM | POA: Diagnosis not present

## 2021-04-19 DIAGNOSIS — U071 COVID-19: Secondary | ICD-10-CM | POA: Diagnosis not present

## 2021-04-19 DIAGNOSIS — Z6826 Body mass index (BMI) 26.0-26.9, adult: Secondary | ICD-10-CM | POA: Diagnosis not present

## 2021-04-29 DIAGNOSIS — L821 Other seborrheic keratosis: Secondary | ICD-10-CM | POA: Diagnosis not present

## 2021-04-29 DIAGNOSIS — L404 Guttate psoriasis: Secondary | ICD-10-CM | POA: Diagnosis not present

## 2021-04-29 DIAGNOSIS — L400 Psoriasis vulgaris: Secondary | ICD-10-CM | POA: Diagnosis not present

## 2021-05-03 DIAGNOSIS — L400 Psoriasis vulgaris: Secondary | ICD-10-CM | POA: Diagnosis not present

## 2021-05-05 DIAGNOSIS — L400 Psoriasis vulgaris: Secondary | ICD-10-CM | POA: Diagnosis not present

## 2021-05-07 DIAGNOSIS — L400 Psoriasis vulgaris: Secondary | ICD-10-CM | POA: Diagnosis not present

## 2021-05-10 DIAGNOSIS — L400 Psoriasis vulgaris: Secondary | ICD-10-CM | POA: Diagnosis not present

## 2021-05-12 DIAGNOSIS — L400 Psoriasis vulgaris: Secondary | ICD-10-CM | POA: Diagnosis not present

## 2021-05-14 DIAGNOSIS — L400 Psoriasis vulgaris: Secondary | ICD-10-CM | POA: Diagnosis not present

## 2021-05-15 ENCOUNTER — Other Ambulatory Visit: Payer: Self-pay | Admitting: Hematology and Oncology

## 2021-05-17 DIAGNOSIS — L400 Psoriasis vulgaris: Secondary | ICD-10-CM | POA: Diagnosis not present

## 2021-05-19 DIAGNOSIS — L400 Psoriasis vulgaris: Secondary | ICD-10-CM | POA: Diagnosis not present

## 2021-05-20 DIAGNOSIS — Z79899 Other long term (current) drug therapy: Secondary | ICD-10-CM | POA: Diagnosis not present

## 2021-05-20 DIAGNOSIS — H401132 Primary open-angle glaucoma, bilateral, moderate stage: Secondary | ICD-10-CM | POA: Diagnosis not present

## 2021-05-20 DIAGNOSIS — H35033 Hypertensive retinopathy, bilateral: Secondary | ICD-10-CM | POA: Diagnosis not present

## 2021-05-20 DIAGNOSIS — M0570 Rheumatoid arthritis with rheumatoid factor of unspecified site without organ or systems involvement: Secondary | ICD-10-CM | POA: Diagnosis not present

## 2021-05-21 DIAGNOSIS — L400 Psoriasis vulgaris: Secondary | ICD-10-CM | POA: Diagnosis not present

## 2021-05-24 DIAGNOSIS — L400 Psoriasis vulgaris: Secondary | ICD-10-CM | POA: Diagnosis not present

## 2021-05-26 DIAGNOSIS — L400 Psoriasis vulgaris: Secondary | ICD-10-CM | POA: Diagnosis not present

## 2021-05-26 NOTE — Progress Notes (Signed)
Patient Care Team: Cari Caraway, MD as PCP - General (Family Medicine) Minus Breeding, MD as PCP - Cardiology (Cardiology) Nicholas Lose, MD as Consulting Physician (Hematology and Oncology) Delice Bison, Charlestine Massed, NP as Nurse Practitioner (Hematology and Oncology) Erroll Luna, MD as Consulting Physician (General Surgery) Eppie Gibson, MD as Attending Physician (Radiation Oncology)  DIAGNOSIS:    ICD-10-CM   1. Malignant neoplasm of upper-outer quadrant of right breast in female, estrogen receptor positive ([place])  C50.411    Z17.0       SUMMARY OF ONCOLOGIC HISTORY: Oncology History  Malignant neoplasm of upper-outer quadrant of right breast in female, estrogen receptor positive ([place])  04/12/2017 Initial Diagnosis   right breast suspicious mass at 12 o'clock position 1.1 cm; left breast 1 o'clock position 0.6 cm   05/09/2017 Surgery   Right lumpectomy: IDC grade 1, 1.1 cm, low-grade DCIS, margins negative, 0/3 lymph nodes negative ER 100%, PR 100%, HER-2 negative ratio 1.26, Ki-67 1%, T1CN0 stage I a; left lumpectomy: Intraductal papilloma, ADH   05/2017 -  Anti-estrogen oral therapy   Anastrozole daily     CHIEF COMPLIANT: Follow-up of right breast cancer on anastrozole therapy  INTERVAL HISTORY: Christina Henderson is a 85 y.o. with above-mentioned history of right breast cancer treated with lumpectomy and who is currently on antiestrogen therapy with anastrozole. Mammogram on 04/07/2021 showed no evidence of malignancy bilaterally. She presents to the clinic today for follow-up.  She is tolerating the treatment extremely well without any problems or concerns.  She has had COVID infection and has recovered extremely well.  ALLERGIES:  is allergic to caffeine, statins, and sulfa antibiotics.  MEDICATIONS:  Current Outpatient Medications  Medication Sig Dispense Refill   amLODipine (NORVASC) 2.5 MG tablet Take 1 tablet by mouth daily.     anastrozole (ARIMIDEX) 1  MG tablet TAKE ONE TABLET BY MOUTH ONE TIME DAILY 90 tablet 0   COVID-19 mRNA vaccine, Moderna, 100 MCG/0.5ML SUSP Moderna COVID-19 Vaccine (PF) 100 mcg/0.5 mL intramuscular susp. (EUA)  PHARMACY ADMINISTERED     diltiazem (CARDIZEM CD) 180 MG 24 hr capsule TAKE ONE CAPSULE BY MOUTH ONE TIME DAILY 90 capsule 3   folic acid (FOLVITE) 1 MG tablet Take 1 tablet (1 mg total) by mouth daily.     hydroxychloroquine (PLAQUENIL) 200 MG tablet Take 2 tablets (400 mg total) by mouth daily.     ibandronate (BONIVA) 150 MG tablet Take 1 tablet by mouth every 30 (thirty) days.     losartan (COZAAR) 100 MG tablet Take 1 tablet (100 mg total) by mouth daily. 90 tablet 3   Multiple Vitamin (MULTIVITAMIN WITH MINERALS) TABS tablet Take 1 tablet by mouth daily.     Probiotic Product (PROBIOTIC ACIDOPHILUS BIOBEADS) CAPS Take by mouth daily.      rosuvastatin (CRESTOR) 10 MG tablet Take 10 mg by mouth daily.     No current facility-administered medications for this visit.    PHYSICAL EXAMINATION: ECOG PERFORMANCE STATUS: 1 - Symptomatic but completely ambulatory  Vitals:   05/27/21 1008  BP: (!) 163/59  Pulse: 66  Resp: 17  Temp: (!) 97.3 F (36.3 C)  SpO2: 98%   Filed Weights   05/27/21 1008  Weight: 140 lb 8 oz (63.7 kg)    BREAST: No palpable masses or nodules in either right or left breasts. No palpable axillary supraclavicular or infraclavicular adenopathy no breast tenderness or nipple discharge. (exam performed in the presence of a chaperone)  LABORATORY DATA:  I have  reviewed the data as listed CMP Latest Ref Rng & Units 05/03/2017  Glucose 65 - 99 mg/dL 88  BUN 6 - 20 mg/dL 24(H)  Creatinine 0.44 - 1.00 mg/dL 0.74  Sodium 135 - 145 mmol/L 140  Potassium 3.5 - 5.1 mmol/L 4.2  Chloride 101 - 111 mmol/L 105  CO2 22 - 32 mmol/L 28  Calcium 8.9 - 10.3 mg/dL 10.0  Total Protein 6.5 - 8.1 g/dL 7.1  Total Bilirubin 0.3 - 1.2 mg/dL 1.0  Alkaline Phos 38 - 126 U/L 63  AST 15 - 41 U/L 24   ALT 14 - 54 U/L 25    Lab Results  Component Value Date   WBC 6.7 05/03/2017   HGB 14.3 05/03/2017   HCT 43.9 05/03/2017   MCV 93.8 05/03/2017   PLT 233 05/03/2017   NEUTROABS 4.1 05/03/2017    ASSESSMENT & PLAN:  Malignant neoplasm of upper-outer quadrant of right breast in female, estrogen receptor positive ([place]) 05/09/2017: Right lumpectomy: IDC grade 1, 1.1 cm, low-grade DCIS, margins negative, 0/3 lymph nodes negative ER 100%, PR 100%, HER-2 negative ratio 1.26, Ki-67 1%, T1CN0 stage I a; left lumpectomy: Intraductal papilloma, ADH   Current treatment: Anastrozole 1 mg p.o. daily started 05/23/2017   Anastrozole toxicities: Denies any side effects.  She has rheumatoid arthritis which causes her some joint aches and stiffness. She also has psoriasis for which she is receiving light therapy.  She is hoping that this will work to get rid of the psoriasis.   Breast cancer surveillance: 1. Mammogram 04/11/2021: Benign breast density category B 2. Breast exam 05/27/2021: Benign 3. Bone density 04/01/2020: T score -2.3: Osteopenia: She is not taking bisphosphonate therapy.   She talked about her life growing up in [place]. Return to clinic in 1 year for follow-up    No orders of the defined types were placed in this encounter.  The patient has a good understanding of the overall plan. she agrees with it. she will call with any problems that may develop before the next visit here.  Total time spent: 20 mins including face to face time and time spent for planning, charting and coordination of care  Rulon Eisenmenger, MD, MPH 05/27/2021  I, Thana Ates, am acting as scribe for Dr. Nicholas Lose.  I have reviewed the above documentation for accuracy and completeness, and I agree with the above.

## 2021-05-27 ENCOUNTER — Other Ambulatory Visit: Payer: Self-pay

## 2021-05-27 ENCOUNTER — Inpatient Hospital Stay: Payer: Medicare Other | Attending: Hematology and Oncology | Admitting: Hematology and Oncology

## 2021-05-27 DIAGNOSIS — Z8616 Personal history of COVID-19: Secondary | ICD-10-CM | POA: Insufficient documentation

## 2021-05-27 DIAGNOSIS — Z79811 Long term (current) use of aromatase inhibitors: Secondary | ICD-10-CM | POA: Insufficient documentation

## 2021-05-27 DIAGNOSIS — C50411 Malignant neoplasm of upper-outer quadrant of right female breast: Secondary | ICD-10-CM | POA: Insufficient documentation

## 2021-05-27 DIAGNOSIS — Z170 Estrogen receptor positive status [ER+]: Secondary | ICD-10-CM | POA: Diagnosis not present

## 2021-05-27 DIAGNOSIS — L409 Psoriasis, unspecified: Secondary | ICD-10-CM | POA: Diagnosis not present

## 2021-05-27 DIAGNOSIS — M8580 Other specified disorders of bone density and structure, unspecified site: Secondary | ICD-10-CM | POA: Insufficient documentation

## 2021-05-27 DIAGNOSIS — M069 Rheumatoid arthritis, unspecified: Secondary | ICD-10-CM | POA: Insufficient documentation

## 2021-05-27 MED ORDER — ANASTROZOLE 1 MG PO TABS
1.0000 mg | ORAL_TABLET | Freq: Every day | ORAL | 3 refills | Status: DC
Start: 2021-05-27 — End: 2022-05-27
  Filled 2021-08-17: qty 90, 90d supply, fill #0
  Filled 2021-11-14: qty 90, 90d supply, fill #1

## 2021-05-27 NOTE — Assessment & Plan Note (Signed)
05/09/2017:Right lumpectomy: IDC grade 1, 1.1 cm, low-grade DCIS, margins negative, 0/3 lymph nodes negative ER 100%, PR 100%, HER-2 negative ratio 1.26, Ki-67 1%, T1CN0 stage I a; left lumpectomy: Intraductal papilloma, ADH  Current treatment: Anastrozole 1 mg p.o. daily started 05/23/2017  Anastrozole toxicities:Denies any side effects. She has rheumatoid arthritis which causes her some joint aches and stiffness. She also has psoriasis for which she is receiving light therapy.  She is hoping that this will work to get rid of the psoriasis.  Breast cancer surveillance: 1.Mammogram 04/11/2021: Benign breast density category B 2.Breast exam 05/27/2021: Benign 3. Bone density 04/01/2020: T score -2.3: Osteopenia: She is not taking bisphosphonate therapy.  Return to clinic in 1 year for follow-up

## 2021-05-28 DIAGNOSIS — L400 Psoriasis vulgaris: Secondary | ICD-10-CM | POA: Diagnosis not present

## 2021-06-07 DIAGNOSIS — L400 Psoriasis vulgaris: Secondary | ICD-10-CM | POA: Diagnosis not present

## 2021-06-09 DIAGNOSIS — L400 Psoriasis vulgaris: Secondary | ICD-10-CM | POA: Diagnosis not present

## 2021-06-11 DIAGNOSIS — I10 Essential (primary) hypertension: Secondary | ICD-10-CM | POA: Diagnosis not present

## 2021-06-11 DIAGNOSIS — L400 Psoriasis vulgaris: Secondary | ICD-10-CM | POA: Diagnosis not present

## 2021-06-11 DIAGNOSIS — E785 Hyperlipidemia, unspecified: Secondary | ICD-10-CM | POA: Diagnosis not present

## 2021-06-14 DIAGNOSIS — L400 Psoriasis vulgaris: Secondary | ICD-10-CM | POA: Diagnosis not present

## 2021-06-18 DIAGNOSIS — L400 Psoriasis vulgaris: Secondary | ICD-10-CM | POA: Diagnosis not present

## 2021-06-21 DIAGNOSIS — L400 Psoriasis vulgaris: Secondary | ICD-10-CM | POA: Diagnosis not present

## 2021-06-23 DIAGNOSIS — L400 Psoriasis vulgaris: Secondary | ICD-10-CM | POA: Diagnosis not present

## 2021-06-26 DIAGNOSIS — M79642 Pain in left hand: Secondary | ICD-10-CM | POA: Diagnosis not present

## 2021-06-28 DIAGNOSIS — L400 Psoriasis vulgaris: Secondary | ICD-10-CM | POA: Diagnosis not present

## 2021-06-30 DIAGNOSIS — L400 Psoriasis vulgaris: Secondary | ICD-10-CM | POA: Diagnosis not present

## 2021-07-07 DIAGNOSIS — L400 Psoriasis vulgaris: Secondary | ICD-10-CM | POA: Diagnosis not present

## 2021-07-09 DIAGNOSIS — L400 Psoriasis vulgaris: Secondary | ICD-10-CM | POA: Diagnosis not present

## 2021-07-14 DIAGNOSIS — L400 Psoriasis vulgaris: Secondary | ICD-10-CM | POA: Diagnosis not present

## 2021-07-16 DIAGNOSIS — L400 Psoriasis vulgaris: Secondary | ICD-10-CM | POA: Diagnosis not present

## 2021-07-19 ENCOUNTER — Other Ambulatory Visit (HOSPITAL_BASED_OUTPATIENT_CLINIC_OR_DEPARTMENT_OTHER): Payer: Self-pay

## 2021-07-19 DIAGNOSIS — L404 Guttate psoriasis: Secondary | ICD-10-CM | POA: Diagnosis not present

## 2021-07-19 DIAGNOSIS — L400 Psoriasis vulgaris: Secondary | ICD-10-CM | POA: Diagnosis not present

## 2021-07-21 ENCOUNTER — Ambulatory Visit: Payer: Medicare Other | Attending: Internal Medicine

## 2021-07-21 ENCOUNTER — Other Ambulatory Visit (HOSPITAL_BASED_OUTPATIENT_CLINIC_OR_DEPARTMENT_OTHER): Payer: Self-pay

## 2021-07-21 DIAGNOSIS — Z23 Encounter for immunization: Secondary | ICD-10-CM

## 2021-07-21 DIAGNOSIS — L400 Psoriasis vulgaris: Secondary | ICD-10-CM | POA: Diagnosis not present

## 2021-07-21 MED ORDER — MODERNA COVID-19 BIVAL BOOSTER 50 MCG/0.5ML IM SUSP
INTRAMUSCULAR | 0 refills | Status: DC
  Filled 2021-07-21: qty 0.5, 1d supply, fill #0

## 2021-07-21 NOTE — Progress Notes (Signed)
° °  Covid-19 Vaccination Clinic  Name:  Christina Henderson    MRN: [id number] DOB: 06-21-1936  07/21/2021  Christina Henderson was observed post Covid-19 immunization for 15 minutes without incident. She was provided with Vaccine Information Sheet and instruction to access the V-Safe system.   Christina Henderson was instructed to call 911 with any severe reactions post vaccine: Difficulty breathing  Swelling of face and throat  A fast heartbeat  A bad rash all over body  Dizziness and weakness   Immunizations Administered     Name Date Dose VIS Date Route   Moderna Covid-19 vaccine Bivalent Booster 07/21/2021  1:35 PM 0.5 mL 02/20/2021 Intramuscular   Manufacturer: Moderna   Lot: [id number]   [place]: [id number]

## 2021-07-23 DIAGNOSIS — L400 Psoriasis vulgaris: Secondary | ICD-10-CM | POA: Diagnosis not present

## 2021-07-24 NOTE — Progress Notes (Addendum)
Cardiology Office Note   Date:  07/26/2021   ID:  Christina Henderson, DOB 07-11-1936, MRN [id number]  PCP:  Cari Caraway, MD  Cardiologist:   Minus Breeding, MD  Chief Complaint  Patient presents with   Hypertension      History of Present Illness: Christina Henderson is a 86 y.o. female who who presents for follow up of chest pain.   She had a negative POET (Plain Old Exercise Treadmill) in 2019.  Since I last saw her she has done well.  She does bring some blood pressures and they are more labile with readings in the systolics 175 and more frequent readings in the 160s and she has had previously.  However, overall I think her blood pressures are still probably in the averaging 130s range.  She feels well.  She has some tingling in her fingers and toes.  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  She still swimming 5 days/week.  With this activity she denies any chest pressure, neck or arm discomfort.  She has no shortness of breath, PND or orthopnea.  She has a slow heart rate but she has having palpitations.  She does have any presyncope or syncope.  Past Medical History:  Diagnosis Date   Arthritis    rhuematoid arthritis   Breast cancer ([place]) 2018   Right Breast Cancer   Cancer ([place]) 03/2017   right breast cancer   Complication of anesthesia    hard to wake up   GERD (gastroesophageal reflux disease)    food related   Glaucoma    Hypertension    Osteoporosis    osteopenia   Rosacea     Past Surgical History:  Procedure Laterality Date   APPENDECTOMY     BACK SURGERY     BREAST BIOPSY     BREAST LUMPECTOMY Right 04/2017   BREAST LUMPECTOMY WITH RADIOACTIVE SEED AND SENTINEL LYMPH NODE BIOPSY Bilateral 05/09/2017   Procedure: BILATERAL RADIOACTIVE SEED GUIDED LUMPECTOMIES WITH RIGHT SENTINEL  LYMPH NODE BIOPSY;  Surgeon: Erroll Luna, MD;  Location: [facility];  Service: General;  Laterality: Bilateral;   EYE SURGERY     cataract   SPINE SURGERY     lumbar lamenectomy   TONSILLECTOMY       Current Outpatient Medications  Medication Sig Dispense Refill   amLODipine (NORVASC) 2.5 MG tablet Take 1 tablet by mouth daily.     anastrozole (ARIMIDEX) 1 MG tablet Take 1 tablet (1 mg total) by mouth daily. 90 tablet 3   COVID-19 mRNA bivalent vaccine, Moderna, (MODERNA COVID-19 BIVAL BOOSTER) 50 MCG/0.5ML injection Inject into the muscle. 0.5 mL 0   COVID-19 mRNA vaccine, Moderna, 100 MCG/0.5ML SUSP Moderna COVID-19 Vaccine (PF) 100 mcg/0.5 mL intramuscular susp. (EUA)  PHARMACY ADMINISTERED     diltiazem (CARDIZEM CD) 180 MG 24 hr capsule TAKE ONE CAPSULE BY MOUTH ONE TIME DAILY 90 capsule 3   fluocinonide cream (LIDEX) 1.02 % Apply 1 application topically 2 (two) times daily.     hydrALAZINE (APRESOLINE) 10 MG tablet Take every 8 hours as needed for systolic blood pressure over 170. 30 tablet 11   hydroxychloroquine (PLAQUENIL) 200 MG tablet Take 2 tablets (400 mg total) by mouth daily.     ibandronate (BONIVA) 150 MG tablet Take 1  tablet by mouth every 30 (thirty) days.     losartan (COZAAR) 100 MG tablet Take 1 tablet (100 mg total) by mouth daily. 90 tablet 3   Multiple Vitamin (MULTIVITAMIN WITH MINERALS) TABS tablet Take 1 tablet by mouth daily.     Probiotic Product (PROBIOTIC ACIDOPHILUS BIOBEADS) CAPS Take by mouth daily.      rosuvastatin (CRESTOR) 10 MG tablet Take 10 mg by mouth daily.     No current facility-administered medications for this visit.    Allergies:   Caffeine, Statins, and Sulfa antibiotics    ROS:  Please see the history of present illness.   Otherwise, review of systems are positive for none.   All other systems are reviewed and negative.    PHYSICAL EXAM: VS:  BP (!) 164/72    Pulse (!) 44    Ht 5' 1.5" (1.562 m)    Wt 138 lb 9.6  oz (62.9 kg)    SpO2 98%    BMI 25.76 kg/m  , BMI Body mass index is 25.76 kg/m. GENERAL:  Well appearing NECK:  No jugular venous distention, waveform within normal limits, carotid upstroke brisk and symmetric, no bruits, no thyromegaly LUNGS:  Clear to auscultation bilaterally CHEST:  Unremarkable HEART:  PMI not displaced or sustained,S1 and S2 within normal limits, no S3, no S4, no clicks, no rubs, soft apical systolic murmur early peaking, no diastolic murmurs ABD:  Flat, positive bowel sounds normal in frequency in pitch, no bruits, no rebound, no guarding, no midline pulsatile mass, no hepatomegaly, no splenomegaly EXT:  2 plus pulses throughout, no edema, no cyanosis no clubbing  EKG:  EKG is  ordered today. The ekg ordered today demonstrates sinus rhythm, rate 44, axis within normal limits, intervals within normal limits, no acute ST-T wave changes.   Recent Labs: No results found for requested labs within last 8760 hours.    Lipid Panel No results found for: CHOL, TRIG, HDL, CHOLHDL, VLDL, LDLCALC, LDLDIRECT    Wt Readings from Last 3 Encounters:  07/26/21 138 lb 9.6 oz (62.9 kg)  05/27/21 140 lb 8 oz (63.7 kg)  07/17/20 148 lb (67.1 kg)      Other studies Reviewed: Additional studies/ records that were reviewed today include: labs Review of the above records demonstrates:  Please see elsewhere in the note.     ASSESSMENT AND PLAN:   HTN:   Her blood pressure is somewhat labile.  I am going to give her hydralazine 10 mg as needed for systolic blood pressures are greater than 180.  She will let me know if all of it is trending up and we might need to add something else.  She is not on a combination of 2 calcium channel blockers I am going to continue this.   DYSLIPIDEMIA: LDL was 99.  HDL was 55.  This is better than previous.  She will continue the meds as listed.  BRADYCARDIA: She has no symptomatic dysrhythmias.  No change in therapy.  She will let me know if she  has any presyncope or syncope going forward.  Current medicines are reviewed at length with the patient today.  The patient does not have concerns regarding medicines.  The following changes have been made:  None  Labs/ tests ordered today include: None  Orders Placed This Encounter  Procedures   EKG 12-Lead     Disposition:   FU with me in one year.    Signed, Minus Breeding, MD  07/26/2021 10:21 AM  [facility]

## 2021-07-26 ENCOUNTER — Encounter: Payer: Self-pay | Admitting: Cardiology

## 2021-07-26 ENCOUNTER — Other Ambulatory Visit: Payer: Self-pay

## 2021-07-26 ENCOUNTER — Ambulatory Visit (INDEPENDENT_AMBULATORY_CARE_PROVIDER_SITE_OTHER): Payer: Medicare Other | Admitting: Cardiology

## 2021-07-26 ENCOUNTER — Other Ambulatory Visit (HOSPITAL_BASED_OUTPATIENT_CLINIC_OR_DEPARTMENT_OTHER): Payer: Self-pay

## 2021-07-26 ENCOUNTER — Encounter (HOSPITAL_BASED_OUTPATIENT_CLINIC_OR_DEPARTMENT_OTHER): Payer: Self-pay

## 2021-07-26 VITALS — BP 164/72 | HR 44 | Ht 61.5 in | Wt 138.6 lb

## 2021-07-26 DIAGNOSIS — E785 Hyperlipidemia, unspecified: Secondary | ICD-10-CM | POA: Diagnosis not present

## 2021-07-26 DIAGNOSIS — I10 Essential (primary) hypertension: Secondary | ICD-10-CM | POA: Diagnosis not present

## 2021-07-26 DIAGNOSIS — L400 Psoriasis vulgaris: Secondary | ICD-10-CM | POA: Diagnosis not present

## 2021-07-26 MED ORDER — HYDRALAZINE HCL 10 MG PO TABS
ORAL_TABLET | ORAL | 11 refills | Status: DC
  Filled 2021-07-26: qty 30, 10d supply, fill #0
  Filled 2021-08-27: qty 30, 10d supply, fill #1
  Filled 2021-10-05: qty 30, 10d supply, fill #2

## 2021-07-26 NOTE — Patient Instructions (Signed)
Medication Instructions:  Take hydralazine 10 mg every 8 hours as needed for systolic blood pressure over 170.  *If you need a refill on your cardiac medications before your next appointment, please call your pharmacy*    Follow-Up: At [facility], you and your health needs are our priority.  As part of our continuing mission to provide you with exceptional heart care, we have created designated Provider Care Teams.  These Care Teams include your primary Cardiologist (physician) and Advanced Practice Providers (APPs -  Physician Assistants and Nurse Practitioners) who all work together to provide you with the care you need, when you need it.  We recommend signing up for the patient portal called "MyChart".  Sign up information is provided on this After Visit Summary.  MyChart is used to connect with patients for Virtual Visits (Telemedicine).  Patients are able to view lab/test results, encounter notes, upcoming appointments, etc.  Non-urgent messages can be sent to your provider as well.   To learn more about what you can do with MyChart, go to [email or web address].    Your next appointment:   1 year  The format for your next appointment:   In Person  Provider:   Minus Breeding, MD    Other Instructions Monitor blood pressure every 8 hours while awake.

## 2021-07-27 ENCOUNTER — Other Ambulatory Visit (HOSPITAL_BASED_OUTPATIENT_CLINIC_OR_DEPARTMENT_OTHER): Payer: Self-pay

## 2021-07-28 ENCOUNTER — Other Ambulatory Visit (HOSPITAL_BASED_OUTPATIENT_CLINIC_OR_DEPARTMENT_OTHER): Payer: Self-pay

## 2021-07-28 DIAGNOSIS — M79642 Pain in left hand: Secondary | ICD-10-CM | POA: Diagnosis not present

## 2021-07-28 DIAGNOSIS — L409 Psoriasis, unspecified: Secondary | ICD-10-CM | POA: Diagnosis not present

## 2021-07-28 DIAGNOSIS — E663 Overweight: Secondary | ICD-10-CM | POA: Diagnosis not present

## 2021-07-28 DIAGNOSIS — Z6825 Body mass index (BMI) 25.0-25.9, adult: Secondary | ICD-10-CM | POA: Diagnosis not present

## 2021-07-28 DIAGNOSIS — M0579 Rheumatoid arthritis with rheumatoid factor of multiple sites without organ or systems involvement: Secondary | ICD-10-CM | POA: Diagnosis not present

## 2021-07-28 DIAGNOSIS — M150 Primary generalized (osteo)arthritis: Secondary | ICD-10-CM | POA: Diagnosis not present

## 2021-07-28 DIAGNOSIS — L400 Psoriasis vulgaris: Secondary | ICD-10-CM | POA: Diagnosis not present

## 2021-07-28 DIAGNOSIS — M79641 Pain in right hand: Secondary | ICD-10-CM | POA: Diagnosis not present

## 2021-07-28 DIAGNOSIS — Z79899 Other long term (current) drug therapy: Secondary | ICD-10-CM | POA: Diagnosis not present

## 2021-07-29 ENCOUNTER — Encounter: Payer: Self-pay | Admitting: Cardiology

## 2021-07-30 DIAGNOSIS — L400 Psoriasis vulgaris: Secondary | ICD-10-CM | POA: Diagnosis not present

## 2021-08-02 DIAGNOSIS — L400 Psoriasis vulgaris: Secondary | ICD-10-CM | POA: Diagnosis not present

## 2021-08-06 DIAGNOSIS — L400 Psoriasis vulgaris: Secondary | ICD-10-CM | POA: Diagnosis not present

## 2021-08-09 DIAGNOSIS — L400 Psoriasis vulgaris: Secondary | ICD-10-CM | POA: Diagnosis not present

## 2021-08-11 DIAGNOSIS — L400 Psoriasis vulgaris: Secondary | ICD-10-CM | POA: Diagnosis not present

## 2021-08-16 ENCOUNTER — Encounter: Payer: Self-pay | Admitting: Cardiology

## 2021-08-16 DIAGNOSIS — L400 Psoriasis vulgaris: Secondary | ICD-10-CM | POA: Diagnosis not present

## 2021-08-17 ENCOUNTER — Other Ambulatory Visit (HOSPITAL_BASED_OUTPATIENT_CLINIC_OR_DEPARTMENT_OTHER): Payer: Self-pay

## 2021-08-18 ENCOUNTER — Other Ambulatory Visit (HOSPITAL_BASED_OUTPATIENT_CLINIC_OR_DEPARTMENT_OTHER): Payer: Self-pay

## 2021-08-18 DIAGNOSIS — L400 Psoriasis vulgaris: Secondary | ICD-10-CM | POA: Diagnosis not present

## 2021-08-20 DIAGNOSIS — L400 Psoriasis vulgaris: Secondary | ICD-10-CM | POA: Diagnosis not present

## 2021-08-23 DIAGNOSIS — L400 Psoriasis vulgaris: Secondary | ICD-10-CM | POA: Diagnosis not present

## 2021-08-25 ENCOUNTER — Other Ambulatory Visit (HOSPITAL_BASED_OUTPATIENT_CLINIC_OR_DEPARTMENT_OTHER): Payer: Self-pay

## 2021-08-25 DIAGNOSIS — L400 Psoriasis vulgaris: Secondary | ICD-10-CM | POA: Diagnosis not present

## 2021-08-27 ENCOUNTER — Other Ambulatory Visit (HOSPITAL_BASED_OUTPATIENT_CLINIC_OR_DEPARTMENT_OTHER): Payer: Self-pay

## 2021-08-27 DIAGNOSIS — L400 Psoriasis vulgaris: Secondary | ICD-10-CM | POA: Diagnosis not present

## 2021-08-30 DIAGNOSIS — L400 Psoriasis vulgaris: Secondary | ICD-10-CM | POA: Diagnosis not present

## 2021-09-01 DIAGNOSIS — L400 Psoriasis vulgaris: Secondary | ICD-10-CM | POA: Diagnosis not present

## 2021-09-06 DIAGNOSIS — L400 Psoriasis vulgaris: Secondary | ICD-10-CM | POA: Diagnosis not present

## 2021-09-08 DIAGNOSIS — L400 Psoriasis vulgaris: Secondary | ICD-10-CM | POA: Diagnosis not present

## 2021-09-10 DIAGNOSIS — L400 Psoriasis vulgaris: Secondary | ICD-10-CM | POA: Diagnosis not present

## 2021-09-13 DIAGNOSIS — L400 Psoriasis vulgaris: Secondary | ICD-10-CM | POA: Diagnosis not present

## 2021-09-15 DIAGNOSIS — L400 Psoriasis vulgaris: Secondary | ICD-10-CM | POA: Diagnosis not present

## 2021-09-17 DIAGNOSIS — L400 Psoriasis vulgaris: Secondary | ICD-10-CM | POA: Diagnosis not present

## 2021-09-20 DIAGNOSIS — L400 Psoriasis vulgaris: Secondary | ICD-10-CM | POA: Diagnosis not present

## 2021-09-22 DIAGNOSIS — L400 Psoriasis vulgaris: Secondary | ICD-10-CM | POA: Diagnosis not present

## 2021-09-27 DIAGNOSIS — L400 Psoriasis vulgaris: Secondary | ICD-10-CM | POA: Diagnosis not present

## 2021-10-01 DIAGNOSIS — L400 Psoriasis vulgaris: Secondary | ICD-10-CM | POA: Diagnosis not present

## 2021-10-04 DIAGNOSIS — L400 Psoriasis vulgaris: Secondary | ICD-10-CM | POA: Diagnosis not present

## 2021-10-06 ENCOUNTER — Other Ambulatory Visit (HOSPITAL_BASED_OUTPATIENT_CLINIC_OR_DEPARTMENT_OTHER): Payer: Self-pay

## 2021-10-06 DIAGNOSIS — L400 Psoriasis vulgaris: Secondary | ICD-10-CM | POA: Diagnosis not present

## 2021-10-08 DIAGNOSIS — E785 Hyperlipidemia, unspecified: Secondary | ICD-10-CM | POA: Diagnosis not present

## 2021-10-08 DIAGNOSIS — Z79899 Other long term (current) drug therapy: Secondary | ICD-10-CM | POA: Diagnosis not present

## 2021-10-08 DIAGNOSIS — M85852 Other specified disorders of bone density and structure, left thigh: Secondary | ICD-10-CM | POA: Diagnosis not present

## 2021-10-11 DIAGNOSIS — L400 Psoriasis vulgaris: Secondary | ICD-10-CM | POA: Diagnosis not present

## 2021-10-13 DIAGNOSIS — L400 Psoriasis vulgaris: Secondary | ICD-10-CM | POA: Diagnosis not present

## 2021-10-18 DIAGNOSIS — L400 Psoriasis vulgaris: Secondary | ICD-10-CM | POA: Diagnosis not present

## 2021-10-20 DIAGNOSIS — L400 Psoriasis vulgaris: Secondary | ICD-10-CM | POA: Diagnosis not present

## 2021-10-21 DIAGNOSIS — H43813 Vitreous degeneration, bilateral: Secondary | ICD-10-CM | POA: Diagnosis not present

## 2021-10-21 DIAGNOSIS — M0570 Rheumatoid arthritis with rheumatoid factor of unspecified site without organ or systems involvement: Secondary | ICD-10-CM | POA: Diagnosis not present

## 2021-10-21 DIAGNOSIS — Z79899 Other long term (current) drug therapy: Secondary | ICD-10-CM | POA: Diagnosis not present

## 2021-10-21 DIAGNOSIS — H401132 Primary open-angle glaucoma, bilateral, moderate stage: Secondary | ICD-10-CM | POA: Diagnosis not present

## 2021-10-21 DIAGNOSIS — H35033 Hypertensive retinopathy, bilateral: Secondary | ICD-10-CM | POA: Diagnosis not present

## 2021-10-22 DIAGNOSIS — M069 Rheumatoid arthritis, unspecified: Secondary | ICD-10-CM | POA: Diagnosis not present

## 2021-10-22 DIAGNOSIS — D126 Benign neoplasm of colon, unspecified: Secondary | ICD-10-CM | POA: Diagnosis not present

## 2021-10-22 DIAGNOSIS — L409 Psoriasis, unspecified: Secondary | ICD-10-CM | POA: Diagnosis not present

## 2021-10-22 DIAGNOSIS — G479 Sleep disorder, unspecified: Secondary | ICD-10-CM | POA: Diagnosis not present

## 2021-10-22 DIAGNOSIS — L400 Psoriasis vulgaris: Secondary | ICD-10-CM | POA: Diagnosis not present

## 2021-10-22 DIAGNOSIS — E785 Hyperlipidemia, unspecified: Secondary | ICD-10-CM | POA: Diagnosis not present

## 2021-10-22 DIAGNOSIS — Z79899 Other long term (current) drug therapy: Secondary | ICD-10-CM | POA: Diagnosis not present

## 2021-10-22 DIAGNOSIS — I10 Essential (primary) hypertension: Secondary | ICD-10-CM | POA: Diagnosis not present

## 2021-10-22 DIAGNOSIS — D0511 Intraductal carcinoma in situ of right breast: Secondary | ICD-10-CM | POA: Diagnosis not present

## 2021-10-22 DIAGNOSIS — K219 Gastro-esophageal reflux disease without esophagitis: Secondary | ICD-10-CM | POA: Diagnosis not present

## 2021-10-22 DIAGNOSIS — M85852 Other specified disorders of bone density and structure, left thigh: Secondary | ICD-10-CM | POA: Diagnosis not present

## 2021-10-25 DIAGNOSIS — L400 Psoriasis vulgaris: Secondary | ICD-10-CM | POA: Diagnosis not present

## 2021-10-27 ENCOUNTER — Other Ambulatory Visit: Payer: Self-pay | Admitting: Family Medicine

## 2021-10-27 DIAGNOSIS — M85852 Other specified disorders of bone density and structure, left thigh: Secondary | ICD-10-CM

## 2021-10-27 DIAGNOSIS — L400 Psoriasis vulgaris: Secondary | ICD-10-CM | POA: Diagnosis not present

## 2021-10-29 DIAGNOSIS — L400 Psoriasis vulgaris: Secondary | ICD-10-CM | POA: Diagnosis not present

## 2021-11-01 DIAGNOSIS — L400 Psoriasis vulgaris: Secondary | ICD-10-CM | POA: Diagnosis not present

## 2021-11-03 DIAGNOSIS — L400 Psoriasis vulgaris: Secondary | ICD-10-CM | POA: Diagnosis not present

## 2021-11-05 ENCOUNTER — Encounter: Payer: Self-pay | Admitting: Cardiology

## 2021-11-15 ENCOUNTER — Other Ambulatory Visit (HOSPITAL_BASED_OUTPATIENT_CLINIC_OR_DEPARTMENT_OTHER): Payer: Self-pay

## 2021-11-15 DIAGNOSIS — L400 Psoriasis vulgaris: Secondary | ICD-10-CM | POA: Diagnosis not present

## 2021-11-17 ENCOUNTER — Other Ambulatory Visit (HOSPITAL_BASED_OUTPATIENT_CLINIC_OR_DEPARTMENT_OTHER): Payer: Self-pay

## 2021-11-17 DIAGNOSIS — L400 Psoriasis vulgaris: Secondary | ICD-10-CM | POA: Diagnosis not present

## 2021-11-19 DIAGNOSIS — L400 Psoriasis vulgaris: Secondary | ICD-10-CM | POA: Diagnosis not present

## 2021-11-22 DIAGNOSIS — L400 Psoriasis vulgaris: Secondary | ICD-10-CM | POA: Diagnosis not present

## 2021-11-23 ENCOUNTER — Other Ambulatory Visit (HOSPITAL_BASED_OUTPATIENT_CLINIC_OR_DEPARTMENT_OTHER): Payer: Self-pay

## 2021-11-26 DIAGNOSIS — L400 Psoriasis vulgaris: Secondary | ICD-10-CM | POA: Diagnosis not present

## 2021-12-06 ENCOUNTER — Other Ambulatory Visit: Payer: Self-pay | Admitting: Cardiology

## 2021-12-15 DIAGNOSIS — H903 Sensorineural hearing loss, bilateral: Secondary | ICD-10-CM | POA: Diagnosis not present

## 2022-01-19 DIAGNOSIS — L400 Psoriasis vulgaris: Secondary | ICD-10-CM | POA: Diagnosis not present

## 2022-01-19 DIAGNOSIS — L821 Other seborrheic keratosis: Secondary | ICD-10-CM | POA: Diagnosis not present

## 2022-01-27 DIAGNOSIS — H35033 Hypertensive retinopathy, bilateral: Secondary | ICD-10-CM | POA: Diagnosis not present

## 2022-01-27 DIAGNOSIS — H401132 Primary open-angle glaucoma, bilateral, moderate stage: Secondary | ICD-10-CM | POA: Diagnosis not present

## 2022-01-27 DIAGNOSIS — M0570 Rheumatoid arthritis with rheumatoid factor of unspecified site without organ or systems involvement: Secondary | ICD-10-CM | POA: Diagnosis not present

## 2022-01-27 DIAGNOSIS — H43813 Vitreous degeneration, bilateral: Secondary | ICD-10-CM | POA: Diagnosis not present

## 2022-01-27 DIAGNOSIS — Z79899 Other long term (current) drug therapy: Secondary | ICD-10-CM | POA: Diagnosis not present

## 2022-01-31 DIAGNOSIS — M1991 Primary osteoarthritis, unspecified site: Secondary | ICD-10-CM | POA: Diagnosis not present

## 2022-01-31 DIAGNOSIS — G5601 Carpal tunnel syndrome, right upper limb: Secondary | ICD-10-CM | POA: Diagnosis not present

## 2022-01-31 DIAGNOSIS — L409 Psoriasis, unspecified: Secondary | ICD-10-CM | POA: Diagnosis not present

## 2022-01-31 DIAGNOSIS — M0579 Rheumatoid arthritis with rheumatoid factor of multiple sites without organ or systems involvement: Secondary | ICD-10-CM | POA: Diagnosis not present

## 2022-01-31 DIAGNOSIS — Z79899 Other long term (current) drug therapy: Secondary | ICD-10-CM | POA: Diagnosis not present

## 2022-01-31 DIAGNOSIS — Z6825 Body mass index (BMI) 25.0-25.9, adult: Secondary | ICD-10-CM | POA: Diagnosis not present

## 2022-01-31 DIAGNOSIS — E663 Overweight: Secondary | ICD-10-CM | POA: Diagnosis not present

## 2022-02-01 ENCOUNTER — Other Ambulatory Visit (HOSPITAL_BASED_OUTPATIENT_CLINIC_OR_DEPARTMENT_OTHER): Payer: Self-pay

## 2022-02-22 ENCOUNTER — Other Ambulatory Visit: Payer: Self-pay | Admitting: Hematology and Oncology

## 2022-02-22 DIAGNOSIS — Z1231 Encounter for screening mammogram for malignant neoplasm of breast: Secondary | ICD-10-CM

## 2022-03-05 ENCOUNTER — Other Ambulatory Visit: Payer: Self-pay | Admitting: Cardiology

## 2022-04-01 DIAGNOSIS — Z6826 Body mass index (BMI) 26.0-26.9, adult: Secondary | ICD-10-CM | POA: Diagnosis not present

## 2022-04-01 DIAGNOSIS — Z23 Encounter for immunization: Secondary | ICD-10-CM | POA: Diagnosis not present

## 2022-04-01 DIAGNOSIS — Z1389 Encounter for screening for other disorder: Secondary | ICD-10-CM | POA: Diagnosis not present

## 2022-04-01 DIAGNOSIS — Z0000 Encounter for general adult medical examination without abnormal findings: Secondary | ICD-10-CM | POA: Diagnosis not present

## 2022-04-18 DIAGNOSIS — Z23 Encounter for immunization: Secondary | ICD-10-CM | POA: Diagnosis not present

## 2022-04-26 ENCOUNTER — Ambulatory Visit
Admission: RE | Admit: 2022-04-26 | Discharge: 2022-04-26 | Disposition: A | Discharge status: Home / Self Care | Payer: Medicare Other | Source: Ambulatory Visit | Attending: Hematology and Oncology | Admitting: Hematology and Oncology

## 2022-04-26 ENCOUNTER — Ambulatory Visit
Admission: RE | Admit: 2022-04-26 | Discharge: 2022-04-26 | Disposition: A | Discharge status: Home / Self Care | Payer: Medicare Other | Source: Ambulatory Visit | Attending: Family Medicine | Admitting: Family Medicine

## 2022-04-26 DIAGNOSIS — M810 Age-related osteoporosis without current pathological fracture: Secondary | ICD-10-CM | POA: Diagnosis not present

## 2022-04-26 DIAGNOSIS — Z1231 Encounter for screening mammogram for malignant neoplasm of breast: Secondary | ICD-10-CM | POA: Diagnosis not present

## 2022-04-26 DIAGNOSIS — M85832 Other specified disorders of bone density and structure, left forearm: Secondary | ICD-10-CM | POA: Diagnosis not present

## 2022-04-26 DIAGNOSIS — Z780 Asymptomatic menopausal state: Secondary | ICD-10-CM | POA: Diagnosis not present

## 2022-04-26 DIAGNOSIS — M85852 Other specified disorders of bone density and structure, left thigh: Secondary | ICD-10-CM

## 2022-05-02 DIAGNOSIS — M069 Rheumatoid arthritis, unspecified: Secondary | ICD-10-CM | POA: Diagnosis not present

## 2022-05-02 DIAGNOSIS — Z23 Encounter for immunization: Secondary | ICD-10-CM | POA: Diagnosis not present

## 2022-05-02 DIAGNOSIS — D0511 Intraductal carcinoma in situ of right breast: Secondary | ICD-10-CM | POA: Diagnosis not present

## 2022-05-02 DIAGNOSIS — M810 Age-related osteoporosis without current pathological fracture: Secondary | ICD-10-CM | POA: Diagnosis not present

## 2022-05-02 DIAGNOSIS — R011 Cardiac murmur, unspecified: Secondary | ICD-10-CM | POA: Diagnosis not present

## 2022-05-02 DIAGNOSIS — E785 Hyperlipidemia, unspecified: Secondary | ICD-10-CM | POA: Diagnosis not present

## 2022-05-02 DIAGNOSIS — I10 Essential (primary) hypertension: Secondary | ICD-10-CM | POA: Diagnosis not present

## 2022-05-02 DIAGNOSIS — R079 Chest pain, unspecified: Secondary | ICD-10-CM | POA: Diagnosis not present

## 2022-05-02 DIAGNOSIS — Z6826 Body mass index (BMI) 26.0-26.9, adult: Secondary | ICD-10-CM | POA: Diagnosis not present

## 2022-05-02 DIAGNOSIS — G479 Sleep disorder, unspecified: Secondary | ICD-10-CM | POA: Diagnosis not present

## 2022-05-05 DIAGNOSIS — H35033 Hypertensive retinopathy, bilateral: Secondary | ICD-10-CM | POA: Diagnosis not present

## 2022-05-05 DIAGNOSIS — H401132 Primary open-angle glaucoma, bilateral, moderate stage: Secondary | ICD-10-CM | POA: Diagnosis not present

## 2022-05-05 DIAGNOSIS — M0570 Rheumatoid arthritis with rheumatoid factor of unspecified site without organ or systems involvement: Secondary | ICD-10-CM | POA: Diagnosis not present

## 2022-05-05 DIAGNOSIS — H43813 Vitreous degeneration, bilateral: Secondary | ICD-10-CM | POA: Diagnosis not present

## 2022-05-22 NOTE — Progress Notes (Signed)
Patient Care Team: Cari Caraway, MD as PCP - General (Family Medicine) Minus Breeding, MD as PCP - Cardiology (Cardiology) Nicholas Lose, MD as Consulting Physician (Hematology and Oncology) Delice Bison, Charlestine Massed, NP as Nurse Practitioner (Hematology and Oncology) Erroll Luna, MD as Consulting Physician (General Surgery) Eppie Gibson, MD as Attending Physician (Radiation Oncology) Minus Breeding, MD as Consulting Physician (Cardiology)  DIAGNOSIS:  Encounter Diagnosis  Name Primary?   Malignant neoplasm of upper-outer quadrant of right breast in female, estrogen receptor positive ([place]) Yes    SUMMARY OF ONCOLOGIC HISTORY: Oncology History  Malignant neoplasm of upper-outer quadrant of right breast in female, estrogen receptor positive ([place])  04/12/2017 Initial Diagnosis   right breast suspicious mass at 12 o'clock position 1.1 cm; left breast 1 o'clock position 0.6 cm   05/09/2017 Surgery   Right lumpectomy: IDC grade 1, 1.1 cm, low-grade DCIS, margins negative, 0/3 lymph nodes negative ER 100%, PR 100%, HER-2 negative ratio 1.26, Ki-67 1%, T1CN0 stage I a; left lumpectomy: Intraductal papilloma, ADH   05/2017 -  Anti-estrogen oral therapy   Anastrozole daily     CHIEF COMPLIANT:  Follow-up of right breast cancer on anastrozole therapy    INTERVAL HISTORY: Christina Henderson is a 86 y.o. with above-mentioned history of right breast cancer treated with lumpectomy and who is currently on antiestrogen therapy with anastrozole. Mammogram on 04/07/2021 showed no evidence of malignancy bilaterally. She presents to the clinic today for follow-up.    She is tolerating the anastrozole extremely well. She states that the hot flashes has subsided. Her main concern is her heart murmur when she swims and also her blood pressure being elevated.  ALLERGIES:  is allergic to caffeine, statins, and sulfa antibiotics.  MEDICATIONS:  Current Outpatient Medications  Medication  Sig Dispense Refill   amLODipine (NORVASC) 2.5 MG tablet Take 1 tablet by mouth daily.     anastrozole (ARIMIDEX) 1 MG tablet Take 1 tablet (1 mg total) by mouth daily. 90 tablet 3   COVID-19 mRNA bivalent vaccine, Moderna, (MODERNA COVID-19 BIVAL BOOSTER) 50 MCG/0.5ML injection Inject into the muscle. 0.5 mL 0   COVID-19 mRNA vaccine, Moderna, 100 MCG/0.5ML SUSP Moderna COVID-19 Vaccine (PF) 100 mcg/0.5 mL intramuscular susp. (EUA)  PHARMACY ADMINISTERED     diltiazem (CARDIZEM CD) 180 MG 24 hr capsule TAKE ONE CAPSULE BY MOUTH ONE TIME DAILY 90 capsule 3   fluocinonide cream (LIDEX) 0.53 % Apply 1 application topically 2 (two) times daily.     hydrALAZINE (APRESOLINE) 10 MG tablet Take every 8 hours as needed for systolic blood pressure over 170. 30 tablet 11   hydroxychloroquine (PLAQUENIL) 200 MG tablet Take 2 tablets (400 mg total) by mouth daily.     ibandronate (BONIVA) 150 MG tablet Take 1 tablet by mouth every 30 (thirty) days.     losartan (COZAAR) 100 MG tablet Take 1 tablet (100 mg total) by mouth daily. 90 tablet 3   Multiple Vitamin (MULTIVITAMIN WITH MINERALS) TABS tablet Take 1 tablet by mouth daily.     olmesartan (BENICAR) 40 MG tablet Take 40 mg by mouth daily.     Probiotic Product (PROBIOTIC ACIDOPHILUS BIOBEADS) CAPS Take by mouth daily.      rosuvastatin (CRESTOR) 10 MG tablet Take 10 mg by mouth daily.     No current facility-administered medications for this visit.    PHYSICAL EXAMINATION: ECOG PERFORMANCE STATUS: 1 - Symptomatic but completely ambulatory  Vitals:   05/27/22 0932  BP: (!) 146/59  Pulse: 64  Resp: 19  Temp: (!) 97.2 F (36.2 C)  SpO2: 100%   Filed Weights   05/27/22 0932  Weight: 142 lb (64.4 kg)    BREAST: No palpable masses or nodules in either right or left breasts. No palpable axillary supraclavicular or infraclavicular adenopathy no breast tenderness or nipple discharge. (exam performed in the presence of a chaperone)  LABORATORY  DATA:  I have reviewed the data as listed    Latest Ref Rng & Units 05/03/2017    3:06 PM  CMP  Glucose 65 - 99 mg/dL 88   BUN 6 - 20 mg/dL 24   Creatinine 0.44 - 1.00 mg/dL 0.74   Sodium 135 - 145 mmol/L 140   Potassium 3.5 - 5.1 mmol/L 4.2   Chloride 101 - 111 mmol/L 105   CO2 22 - 32 mmol/L 28   Calcium 8.9 - 10.3 mg/dL 10.0   Total Protein 6.5 - 8.1 g/dL 7.1   Total Bilirubin 0.3 - 1.2 mg/dL 1.0   Alkaline Phos 38 - 126 U/L 63   AST 15 - 41 U/L 24   ALT 14 - 54 U/L 25     Lab Results  Component Value Date   WBC 6.7 05/03/2017   HGB 14.3 05/03/2017   HCT 43.9 05/03/2017   MCV 93.8 05/03/2017   PLT 233 05/03/2017   NEUTROABS 4.1 05/03/2017    ASSESSMENT & PLAN:  Malignant neoplasm of upper-outer quadrant of right breast in female, estrogen receptor positive ([place]) 05/09/2017: Right lumpectomy: IDC grade 1, 1.1 cm, low-grade DCIS, margins negative, 0/3 lymph nodes negative ER 100%, PR 100%, HER-2 negative ratio 1.26, Ki-67 1%, T1CN0 stage I a; left lumpectomy: Intraductal papilloma, ADH   Current treatment: Anastrozole 1 mg p.o. daily started 05/23/2017   Anastrozole toxicities: Denies any side effects.  She has rheumatoid arthritis which causes her some joint aches and stiffness. She also has psoriasis for which she is receiving light therapy.  She is hoping that this will work to get rid of the psoriasis.   Breast cancer surveillance: 1. Mammogram 04/27/2022: Benign breast density category C 2. Breast exam 05/27/2022: Benign 3. Bone density 04/26/2022: T score -2.7: Osteoporosis: Recommended bisphosphonate therapy.   She talked about her life growing up in [place]. Return to clinic in 1 year for follow-up      No orders of the defined types were placed in this encounter.  The patient has a good understanding of the overall plan. she agrees with it. she will call with any problems that may develop before the next visit here. Total time spent: 30 mins including  face to face time and time spent for planning, charting and co-ordination of care   Harriette Ohara, MD 05/27/22    I Gardiner Coins am scribing for Dr. Lindi Adie  I have reviewed the above documentation for accuracy and completeness, and I agree with the above.

## 2022-05-27 ENCOUNTER — Inpatient Hospital Stay: Payer: Medicare Other | Attending: Hematology and Oncology | Admitting: Hematology and Oncology

## 2022-05-27 VITALS — BP 146/59 | HR 64 | Temp 97.2°F | Resp 19 | Ht 61.5 in | Wt 142.0 lb

## 2022-05-27 DIAGNOSIS — M069 Rheumatoid arthritis, unspecified: Secondary | ICD-10-CM | POA: Insufficient documentation

## 2022-05-27 DIAGNOSIS — M810 Age-related osteoporosis without current pathological fracture: Secondary | ICD-10-CM | POA: Insufficient documentation

## 2022-05-27 DIAGNOSIS — C50411 Malignant neoplasm of upper-outer quadrant of right female breast: Secondary | ICD-10-CM | POA: Insufficient documentation

## 2022-05-27 DIAGNOSIS — Z170 Estrogen receptor positive status [ER+]: Secondary | ICD-10-CM | POA: Diagnosis not present

## 2022-05-27 DIAGNOSIS — Z79899 Other long term (current) drug therapy: Secondary | ICD-10-CM | POA: Insufficient documentation

## 2022-05-27 MED ORDER — ANASTROZOLE 1 MG PO TABS: 1.0000 mg | ORAL_TABLET | Freq: Every day | ORAL | 3 refills | Status: DC

## 2022-05-27 NOTE — Assessment & Plan Note (Signed)
05/09/2017: Right lumpectomy: IDC grade 1, 1.1 cm, low-grade DCIS, margins negative, 0/3 lymph nodes negative ER 100%, PR 100%, HER-2 negative ratio 1.26, Ki-67 1%, T1CN0 stage I a; left lumpectomy: Intraductal papilloma, ADH   Current treatment: Anastrozole 1 mg p.o. daily started 05/23/2017   Anastrozole toxicities: Denies any side effects.  She has rheumatoid arthritis which causes her some joint aches and stiffness. She also has psoriasis for which she is receiving light therapy.  She is hoping that this will work to get rid of the psoriasis.   Breast cancer surveillance: 1. Mammogram 04/27/2022: Benign breast density category C 2. Breast exam 05/27/2022: Benign 3. Bone density 04/26/2022: T score -2.7: Osteoporosis: Recommended bisphosphonate therapy.   She talked about her life growing up in [place]. Return to clinic in 1 year for follow-up

## 2022-06-15 DIAGNOSIS — L570 Actinic keratosis: Secondary | ICD-10-CM | POA: Diagnosis not present

## 2022-06-15 DIAGNOSIS — L400 Psoriasis vulgaris: Secondary | ICD-10-CM | POA: Diagnosis not present

## 2022-07-22 DIAGNOSIS — G5601 Carpal tunnel syndrome, right upper limb: Secondary | ICD-10-CM | POA: Diagnosis not present

## 2022-07-28 DIAGNOSIS — M25511 Pain in right shoulder: Secondary | ICD-10-CM | POA: Diagnosis not present

## 2022-08-03 DIAGNOSIS — M0579 Rheumatoid arthritis with rheumatoid factor of multiple sites without organ or systems involvement: Secondary | ICD-10-CM | POA: Diagnosis not present

## 2022-08-03 DIAGNOSIS — E663 Overweight: Secondary | ICD-10-CM | POA: Diagnosis not present

## 2022-08-03 DIAGNOSIS — M1991 Primary osteoarthritis, unspecified site: Secondary | ICD-10-CM | POA: Diagnosis not present

## 2022-08-03 DIAGNOSIS — Z79899 Other long term (current) drug therapy: Secondary | ICD-10-CM | POA: Diagnosis not present

## 2022-08-03 DIAGNOSIS — L409 Psoriasis, unspecified: Secondary | ICD-10-CM | POA: Diagnosis not present

## 2022-08-03 DIAGNOSIS — M720 Palmar fascial fibromatosis [Dupuytren]: Secondary | ICD-10-CM | POA: Diagnosis not present

## 2022-08-03 DIAGNOSIS — Z6825 Body mass index (BMI) 25.0-25.9, adult: Secondary | ICD-10-CM | POA: Diagnosis not present

## 2022-08-15 DIAGNOSIS — R001 Bradycardia, unspecified: Secondary | ICD-10-CM | POA: Insufficient documentation

## 2022-08-15 NOTE — Progress Notes (Unsigned)
Cardiology Office Note   Date:  08/17/2022   ID:  Christina Henderson, DOB Dec 18, 1935, MRN [id number]  PCP:  Cari Caraway, MD  Cardiologist:   Minus Breeding, MD  Chief Complaint  Patient presents with   Hypertension      History of Present Illness: Christina Henderson is a 87 y.o. female who who presents for follow up of chest pain.   She had a negative POET (Plain Old Exercise Treadmill) in 2019.  Since I last saw her she has had very difficult to control hypertension.  The blood pressures have been in the 170s fairly consistently and she brings me an extensive diary.  She is not having any new cardiovascular problems.  She is not having any new chest discomfort, neck or arm discomfort.  She is not having any weight gain or edema.  She does not sleep very well.  When she does not sleep well she does not go swimming.  But she can swim without any cardiovascular complaints.   Past Medical History:  Diagnosis Date   Arthritis    rhuematoid arthritis   Breast cancer ([place]) 2018   Right Breast Cancer   Cancer ([place]) 03/2017   right breast cancer   Complication of anesthesia    hard to wake up   GERD (gastroesophageal reflux disease)    food related   Glaucoma    Hypertension    Osteoporosis    osteopenia   Rosacea     Past Surgical History:  Procedure Laterality Date   APPENDECTOMY     BACK SURGERY     BREAST BIOPSY     BREAST LUMPECTOMY Right 04/2017   BREAST LUMPECTOMY WITH RADIOACTIVE SEED AND SENTINEL LYMPH NODE BIOPSY Bilateral 05/09/2017   Procedure: BILATERAL RADIOACTIVE SEED GUIDED LUMPECTOMIES WITH RIGHT SENTINEL LYMPH NODE BIOPSY;  Surgeon: Erroll Luna, MD;  Location: [facility];  Service: General;  Laterality: Bilateral;   EYE SURGERY     cataract   SPINE SURGERY     lumbar lamenectomy   TONSILLECTOMY       Current Outpatient Medications  Medication Sig Dispense Refill   amLODipine (NORVASC) 10 MG tablet Take 1 tablet (10 mg  total) by mouth at bedtime. 90 tablet 3   anastrozole (ARIMIDEX) 1 MG tablet Take 1 tablet (1 mg total) by mouth daily. 90 tablet 3   COVID-19 mRNA bivalent vaccine, Moderna, (MODERNA COVID-19 BIVAL BOOSTER) 50 MCG/0.5ML injection Inject into the muscle. 0.5 mL 0   COVID-19 mRNA vaccine, Moderna, 100 MCG/0.5ML SUSP Moderna COVID-19 Vaccine (PF) 100 mcg/0.5 mL intramuscular susp. (EUA)  PHARMACY ADMINISTERED     fluocinonide cream (LIDEX) 1.60 % Apply 1 application topically 2 (two) times daily.     hydroxychloroquine (PLAQUENIL) 200 MG tablet Take 2 tablets (400 mg total) by mouth daily.     ibandronate (BONIVA) 150 MG tablet Take 1 tablet by mouth every 30 (thirty) days.     Multiple Vitamin (MULTIVITAMIN WITH MINERALS) TABS tablet Take 1 tablet by mouth daily.     Probiotic Product (PROBIOTIC ACIDOPHILUS BIOBEADS) CAPS Take by mouth daily.      rosuvastatin (CRESTOR) 10 MG tablet Take 10 mg by mouth daily.     hydrALAZINE (APRESOLINE) 10 MG tablet Take 1 tablet (10 mg total) by mouth every 8 (eight) hours. 90 tablet 11   olmesartan (BENICAR) 40 MG tablet Take 1 tablet (40 mg total) by mouth in the morning. 90 tablet 3  No current facility-administered medications for this visit.    Allergies:   Caffeine, Statins, and Sulfa antibiotics    ROS:  Please see the history of present illness.   Otherwise, review of systems are positive for none.   All other systems are reviewed and negative.    PHYSICAL EXAM: VS:  BP (!) 148/78 (BP Location: Left Arm, Patient Position: Sitting, Cuff Size: Normal)   Pulse (!) 54   Ht '5\' 1"'$  (1.549 m)   Wt 142 lb 12.8 oz (64.8 kg)   SpO2 95%   BMI 26.98 kg/m  , BMI Body mass index is 26.98 kg/m. GENERAL:  Well appearing NECK:  No jugular venous distention, waveform within normal limits, carotid upstroke brisk and symmetric, no bruits, no thyromegaly LUNGS:  Clear to auscultation bilaterally CHEST:  Unremarkable HEART:  PMI not displaced or sustained,S1  and S2 within normal limits, no S3, no S4, no clicks, no rubs, no murmurs ABD:  Flat, positive bowel sounds normal in frequency in pitch, no bruits, no rebound, no guarding, no midline pulsatile mass, no hepatomegaly, no splenomegaly EXT:  2 plus pulses throughout, no edema, no cyanosis no clubbing   EKG:  EKG is  ordered today. The ekg ordered today demonstrates sinus rhythm, rate 54, axis within normal limits, intervals within normal limits, no acute ST-T wave changes.   Recent Labs: No results found for requested labs within last 365 days.    Lipid Panel No results found for: "CHOL", "TRIG", "HDL", "CHOLHDL", "VLDL", "LDLCALC", "LDLDIRECT"    Wt Readings from Last 3 Encounters:  08/17/22 142 lb 12.8 oz (64.8 kg)  05/27/22 142 lb (64.4 kg)  07/26/21 138 lb 9.6 oz (62.9 kg)      Other studies Reviewed: Additional studies/ records that were reviewed today include: BP diary Review of the above records demonstrates:  Please see elsewhere in the note.     ASSESSMENT AND PLAN:   HTN:   Her blood pressure is not controlled.  Today I will make several changes.  She is taking Norvasc and diltiazem.  I am going to stop the diltiazem and changed to Norvasc 10 mg nightly.  She is not on Cozaar any longe.  She will remain on the current dose of olmesartan in the morning.  She had to take her hydralazine frequently so I am going to change this to 10 mg scheduled every 8 hours.  This will be the med we will likely titrate.   DYSLIPIDEMIA: LDL was 98 with an HDL of 59.  No change in therapy.   BRADYCARDIA: She has no symptoms related to this.  No change in therapy.  Current medicines are reviewed at length with the patient today.  The patient does not have concerns regarding medicines.  The following changes have been made: As above  Labs/ tests ordered today include:    Orders Placed This Encounter  Procedures   EKG 12-Lead     Disposition:   FU with me 3 months.     Signed, Minus Breeding, MD  08/17/2022 1:04 PM    [facility]

## 2022-08-17 ENCOUNTER — Ambulatory Visit: Payer: Medicare Other | Attending: Cardiology | Admitting: Cardiology

## 2022-08-17 ENCOUNTER — Encounter: Payer: Self-pay | Admitting: Cardiology

## 2022-08-17 VITALS — BP 148/78 | HR 54 | Ht 61.0 in | Wt 142.8 lb

## 2022-08-17 DIAGNOSIS — E785 Hyperlipidemia, unspecified: Secondary | ICD-10-CM | POA: Diagnosis not present

## 2022-08-17 DIAGNOSIS — R001 Bradycardia, unspecified: Secondary | ICD-10-CM

## 2022-08-17 DIAGNOSIS — I10 Essential (primary) hypertension: Secondary | ICD-10-CM

## 2022-08-17 MED ORDER — AMLODIPINE BESYLATE 10 MG PO TABS: 10.0000 mg | ORAL_TABLET | Freq: Every day | ORAL | 3 refills | Status: DC

## 2022-08-17 MED ORDER — OLMESARTAN MEDOXOMIL 40 MG PO TABS: 40.0000 mg | ORAL_TABLET | Freq: Every morning | ORAL | 3 refills | Status: DC

## 2022-08-17 MED ORDER — HYDRALAZINE HCL 10 MG PO TABS: 10.0000 mg | ORAL_TABLET | Freq: Three times a day (TID) | ORAL | 11 refills | Status: DC

## 2022-08-17 NOTE — Patient Instructions (Addendum)
Medication Instructions:  Stop taking Cozaar ( losartan)  Stop taking Cardizem    Hydralazine 10 mg three times a day  ( every 8 hours)   Increase Amlodipine 10 mg  at bedtime   Take Olmesartan  in the morning    *If you need a refill on your cardiac medications before your next appointment, please call your pharmacy*   Lab Work: Not needed    Testing/Procedures:  Not needed  Follow-Up: At [facility], you and your health needs are our priority.  As part of our continuing mission to provide you with exceptional heart care, we have created designated Provider Care Teams.  These Care Teams include your primary Cardiologist (physician) and Advanced Practice Providers (APPs -  Physician Assistants and Nurse Practitioners) who all work together to provide you with the care you need, when you need it.     Your next appointment:   3 month(s)  The format for your next appointment:   In Person  Provider:   Minus Breeding, MD    Other Instructions    Keep a log of blood pressure readings  and you can send them by mychart - 2 to 3 week period

## 2022-08-22 ENCOUNTER — Encounter: Payer: Self-pay | Admitting: Cardiology

## 2022-08-25 DIAGNOSIS — M810 Age-related osteoporosis without current pathological fracture: Secondary | ICD-10-CM | POA: Diagnosis not present

## 2022-08-25 DIAGNOSIS — Z853 Personal history of malignant neoplasm of breast: Secondary | ICD-10-CM | POA: Diagnosis not present

## 2022-08-25 DIAGNOSIS — M549 Dorsalgia, unspecified: Secondary | ICD-10-CM | POA: Diagnosis not present

## 2022-08-25 DIAGNOSIS — R29890 Loss of height: Secondary | ICD-10-CM | POA: Diagnosis not present

## 2022-08-25 DIAGNOSIS — R946 Abnormal results of thyroid function studies: Secondary | ICD-10-CM | POA: Diagnosis not present

## 2022-08-25 DIAGNOSIS — M069 Rheumatoid arthritis, unspecified: Secondary | ICD-10-CM | POA: Diagnosis not present

## 2022-08-25 DIAGNOSIS — G5601 Carpal tunnel syndrome, right upper limb: Secondary | ICD-10-CM | POA: Diagnosis not present

## 2022-08-29 ENCOUNTER — Ambulatory Visit
Admission: RE | Admit: 2022-08-29 | Discharge: 2022-08-29 | Disposition: A | Discharge status: Home / Self Care | Payer: Medicare Other | Source: Ambulatory Visit | Attending: Internal Medicine | Admitting: Internal Medicine

## 2022-08-29 ENCOUNTER — Other Ambulatory Visit: Payer: Self-pay | Admitting: Internal Medicine

## 2022-08-29 DIAGNOSIS — M4316 Spondylolisthesis, lumbar region: Secondary | ICD-10-CM | POA: Diagnosis not present

## 2022-08-29 DIAGNOSIS — M5135 Other intervertebral disc degeneration, thoracolumbar region: Secondary | ICD-10-CM | POA: Diagnosis not present

## 2022-08-29 DIAGNOSIS — M47816 Spondylosis without myelopathy or radiculopathy, lumbar region: Secondary | ICD-10-CM | POA: Diagnosis not present

## 2022-08-29 DIAGNOSIS — M5489 Other dorsalgia: Secondary | ICD-10-CM

## 2022-09-01 ENCOUNTER — Encounter: Payer: Self-pay | Admitting: Cardiology

## 2022-09-02 DIAGNOSIS — R946 Abnormal results of thyroid function studies: Secondary | ICD-10-CM | POA: Diagnosis not present

## 2022-09-02 DIAGNOSIS — E041 Nontoxic single thyroid nodule: Secondary | ICD-10-CM | POA: Diagnosis not present

## 2022-09-02 MED ORDER — HYDRALAZINE HCL 10 MG PO TABS: 30.0000 mg | ORAL_TABLET | Freq: Three times a day (TID) | ORAL | 2 refills | Status: DC

## 2022-09-02 NOTE — Addendum Note (Signed)
Addended by: Patria Mane A on: 09/02/2022 02:12 PM   Modules accepted: Orders

## 2022-09-12 DIAGNOSIS — Z853 Personal history of malignant neoplasm of breast: Secondary | ICD-10-CM | POA: Diagnosis not present

## 2022-09-12 DIAGNOSIS — R29890 Loss of height: Secondary | ICD-10-CM | POA: Diagnosis not present

## 2022-09-12 DIAGNOSIS — M810 Age-related osteoporosis without current pathological fracture: Secondary | ICD-10-CM | POA: Diagnosis not present

## 2022-09-12 DIAGNOSIS — M069 Rheumatoid arthritis, unspecified: Secondary | ICD-10-CM | POA: Diagnosis not present

## 2022-09-12 DIAGNOSIS — E041 Nontoxic single thyroid nodule: Secondary | ICD-10-CM | POA: Diagnosis not present

## 2022-09-14 ENCOUNTER — Other Ambulatory Visit: Payer: Self-pay | Admitting: Internal Medicine

## 2022-09-14 DIAGNOSIS — E041 Nontoxic single thyroid nodule: Secondary | ICD-10-CM

## 2022-09-14 DIAGNOSIS — G8929 Other chronic pain: Secondary | ICD-10-CM | POA: Diagnosis not present

## 2022-09-14 DIAGNOSIS — Z6826 Body mass index (BMI) 26.0-26.9, adult: Secondary | ICD-10-CM | POA: Diagnosis not present

## 2022-09-14 DIAGNOSIS — G478 Other sleep disorders: Secondary | ICD-10-CM | POA: Diagnosis not present

## 2022-09-14 DIAGNOSIS — I10 Essential (primary) hypertension: Secondary | ICD-10-CM | POA: Diagnosis not present

## 2022-09-14 DIAGNOSIS — M5441 Lumbago with sciatica, right side: Secondary | ICD-10-CM | POA: Diagnosis not present

## 2022-09-15 ENCOUNTER — Encounter: Payer: Self-pay | Admitting: Cardiology

## 2022-09-16 DIAGNOSIS — I10 Essential (primary) hypertension: Secondary | ICD-10-CM | POA: Diagnosis not present

## 2022-09-19 ENCOUNTER — Other Ambulatory Visit: Payer: Self-pay

## 2022-09-19 DIAGNOSIS — I10 Essential (primary) hypertension: Secondary | ICD-10-CM

## 2022-09-20 NOTE — Therapy (Signed)
OUTPATIENT PHYSICAL THERAPY LOWER EXTREMITY EVALUATION   Patient Name: Christina Henderson MRN: [id number] DOB:Apr 30, 1936, 87 y.o., female Today's Date: 09/21/2022  END OF SESSION:  PT End of Session - 09/21/22 1522     Visit Number 1    Number of Visits 7    Date for PT Re-Evaluation 11/09/22    Authorization Type MEDICARE PART A AND B;  [employer]    Progress Note Due on Visit --    PT Start Time [id number]    PT Stop Time 1550    PT Time Calculation (min) 45 min    Activity Tolerance Patient tolerated treatment well    Behavior During Therapy WFL for tasks assessed/performed             Past Medical History:  Diagnosis Date   Arthritis    rhuematoid arthritis   Breast cancer ([place]) 2018   Right Breast Cancer   Cancer ([place]) 03/2017   right breast cancer   Complication of anesthesia    hard to wake up   GERD (gastroesophageal reflux disease)    food related   Glaucoma    Hypertension    Osteoporosis    osteopenia   Rosacea    Past Surgical History:  Procedure Laterality Date   APPENDECTOMY     BACK SURGERY     BREAST BIOPSY     BREAST LUMPECTOMY Right 04/2017   BREAST LUMPECTOMY WITH RADIOACTIVE SEED AND SENTINEL LYMPH NODE BIOPSY Bilateral 05/09/2017   Procedure: BILATERAL RADIOACTIVE SEED GUIDED LUMPECTOMIES WITH RIGHT SENTINEL LYMPH NODE BIOPSY;  Surgeon: Erroll Luna, MD;  Location: [place];  Service: General;  Laterality: Bilateral;   EYE SURGERY     cataract   SPINE SURGERY     lumbar lamenectomy   TONSILLECTOMY     Patient Active Problem List   Diagnosis Date Noted   Bradycardia 08/15/2022   Dyslipidemia 11/25/2019   Educated about COVID-19 virus infection 11/25/2019   Chest pain 07/17/2018   Cough 07/17/2018   Essential hypertension 07/17/2018   Malignant neoplasm of upper-outer quadrant of right breast in female, estrogen receptor positive ([place]) 05/23/2017    PCP: Cari Caraway, MD  REFERRING PROVIDER:  Delrae Rend, MD  REFERRING DIAG: M81.0 (ICD-10-CM) - Age-related osteoporosis without current pathological fracture   THERAPY DIAG:  Acute bilateral low back pain with right-sided sciatica  Muscle weakness (generalized)  Rationale for Evaluation and Treatment: Rehabilitation  ONSET DATE: 3 weeks  SUBJECTIVE:   SUBJECTIVE STATEMENT: Pt reports she was referred to PT related to osteoporosis. Pt reports and recent Hx R low back and R LE pain and some concern re: balance although she has not had a fall. Pt reports she swims and walks approx 30-40 mins 4 to 5x a week.  PERTINENT HISTORY: 1974 back surgery  PAIN:  Are you having pain?pain range Yes: NPRS scale: 0-9/10 Pain location: R low back and leg Pain description: ache, sharp  Aggravating factors: prolonged positions Relieving factors: changing positions, arthritis strength tylenol Currently, pt reports 0/10 LBP  PRECAUTIONS: None  WEIGHT BEARING RESTRICTIONS: No  FALLS:  Has patient fallen in last 6 months? No  LIVING ENVIRONMENT: Lives with: lives alone Lives in: House/apartment   OCCUPATION: Retired  PLOF: Independent  PATIENT GOALS: To be stronger and be steadier on her feet   OBJECTIVE:   08/31/22 DIAGNOSTIC FINDINGS:  IMPRESSION: 1. Stable degenerative lumbar spondylosis. Advanced degenerative disc disease and facet disease. 2. No acute bony  findings.  PATIENT SURVEYS:  FOTO: Perceived function   74%, predicted   82%   COGNITION: Overall cognitive status: Within functional limits for tasks assessed     SENSATION: WFL  EDEMA:  None palpated  MUSCLE LENGTH: Hamstrings: Right WNLs deg; Left WNLs deg Marcello Moores test: Right NT deg; Left NT deg  POSTURE: rounded shoulders, forward head, decreased lumbar lordosis, and decreased thoracic kyphosis  PALPATION: TTP lumbar paraspinals  LUMBAR ROM:   Active  A/PROM  09/21/2022  Flexion Full   Extension Min limitation  Right lateral flexion Min  limitation  Left lateral flexion Min limitation  Right rotation Full  Left rotation Full   (Blank rows = not tested)  LOWER EXTREMITY MMT:  MMT Right eval Left eval  Hip flexion 4 4  Hip extension 3+ 3+  Hip abduction 4 4  Hip adduction 4+ 4+  Hip internal rotation 4+ 4+  Hip external rotation 4 4  Knee flexion 4+ 4+  Knee extension 4+ 4+  Ankle dorsiflexion 4+ 4+  Ankle plantarflexion 4+ 4+  Ankle inversion    Ankle eversion     (Blank rows = not tested)  LOWER EXTREMITY ROM:  Active ROM Right eval Left eval  Hip flexion    Hip extension    Hip abduction    Hip adduction    Hip internal rotation    Hip external rotation    Knee flexion    Knee extension    Ankle dorsiflexion    Ankle plantarflexion    Ankle inversion    Ankle eversion     (Blank rows = not tested)  LOWER EXTREMITY SPECIAL TESTS:  NA  FUNCTIONAL TESTS:  5 times sit to stand: 10.4 sec s use of hands  SLS: L=4,6 sec- 5 sec; R= 2, 5 sec- 3sec GAIT: Distance walked: [id number] Assistive device utilized: None Level of assistance: Complete Independence Comments: WNLs   TODAY'S TREATMENT:                                                                                                                               [facility] Adult PT Treatment:                                                DATE: 09/21/22 Therapeutic Exercise: Developed, instructed in, and pt completed therex as noted in HEP Self Care: Use of pillow between knees when sleeping in SL to minimize LBP   PATIENT EDUCATION:  Education details: Eval findings, POC, HEP, self care  Person educated: Patient Education method: Explanation, Demonstration, Tactile cues, Verbal cues, and Handouts Education comprehension: verbalized understanding, returned demonstration, verbal cues required, and tactile cues required  HOME EXERCISE PROGRAM: Access Code: [email or web address] URL: [email or web address] Date: 09/21/2022 Prepared by: AGar Ponto Exercises - Hooklying Single Knee to  Chest  - 1-2 x daily - 7 x weekly - 1 sets - 2 reps - 20 hold - Heel Toe Raises with Counter Support  - 1 x daily - 7 x weekly - 1-2 sets - 10-15 reps - 2 hold - Standing Single Leg Stance with Counter Support  - 1 x daily - 7 x weekly - 1-2 sets - 3-5 reps - 20 hold - Sit to Stand Without Arm Support  - 1 x daily - 7 x weekly - 1-2 sets - 10-15 reps - Shoulder extension with resistance - Neutral  - 1 x daily - 7 x weekly - 1-2 sets - 10-15 reps - Standing Bicep Curls with Resistance  - 1 x daily - 7 x weekly - 1-2 sets - 10-15 reps  ASSESSMENT:  CLINICAL IMPRESSION: Patient is a 87 y.o. female who was seen today for physical therapy evaluation and treatment for M81.0 (ICD-10-CM) - Age-related osteoporosis without current pathological fracture. Pt presents with dereased hip strength and SLS balance. Pt also has had a recent Hx ofLBP and R LE pain. Pt will benefit from skilled PT to address impairments for improved functional mobility.   OBJECTIVE IMPAIRMENTS: decreased activity tolerance, decreased balance, decreased strength, and pain.   ACTIVITY LIMITATIONS: carrying, lifting, bending, and sitting  PARTICIPATION LIMITATIONS: cleaning, laundry, and shopping  PERSONAL FACTORS: Age, Past/current experiences, and Time since onset of injury/illness/exacerbation are also affecting patient's functional outcome.   REHAB POTENTIAL: Good  CLINICAL DECISION MAKING: Stable/uncomplicated  EVALUATION COMPLEXITY: Low   GOALS:  SHORT TERM GOALS=LTGs  LONG TERM GOALS: Target date: 11/09/22  Pt will be Ind in a final HEP to maintain achieved LOF  Baseline: initiated Goal status: INITIAL  2.  Pt will report a pain range of 0-4/10 for improved QOL Baseline: 0-9/10 Goal status: INITIAL  3.  Pt will demonstrate an increase SLS to 10 sec of greater for improved balance Baseline: see functional tests Goal status: INITIAL  4.  Increase blat hip  strength to 4+/5 for improved functiona and balance Baseline: See flow sheets Goal status: INITIAL   PLAN:  PT FREQUENCY: 1x/week  PT DURATION: 6 weeks  PLANNED INTERVENTIONS: Therapeutic exercises, Therapeutic activity, Balance training, Gait training, Patient/Family education, Self Care, Joint mobilization, Dry Needling, Electrical stimulation, Cryotherapy, Moist heat, Taping, Ultrasound, Ionotophoresis '4mg'$ /ml Dexamethasone, Manual therapy, and Re-evaluation  PLAN FOR NEXT SESSION: Review FOTO; assess response to HEP; progress therex as indicated; use of modalities, manual therapy; and TPDN as indicated.   Phil Corti MS, PT 09/21/22 6:27 PM

## 2022-09-21 ENCOUNTER — Other Ambulatory Visit: Payer: Self-pay

## 2022-09-21 ENCOUNTER — Ambulatory Visit: Payer: Medicare Other | Attending: Internal Medicine

## 2022-09-21 DIAGNOSIS — M6281 Muscle weakness (generalized): Secondary | ICD-10-CM

## 2022-09-21 DIAGNOSIS — M5441 Lumbago with sciatica, right side: Secondary | ICD-10-CM | POA: Diagnosis not present

## 2022-09-21 DIAGNOSIS — M810 Age-related osteoporosis without current pathological fracture: Secondary | ICD-10-CM | POA: Insufficient documentation

## 2022-09-27 ENCOUNTER — Ambulatory Visit: Payer: Medicare Other

## 2022-09-27 DIAGNOSIS — M810 Age-related osteoporosis without current pathological fracture: Secondary | ICD-10-CM | POA: Diagnosis not present

## 2022-09-27 DIAGNOSIS — M6281 Muscle weakness (generalized): Secondary | ICD-10-CM

## 2022-09-27 DIAGNOSIS — M5441 Lumbago with sciatica, right side: Secondary | ICD-10-CM | POA: Diagnosis not present

## 2022-09-27 NOTE — Therapy (Signed)
OUTPATIENT PHYSICAL THERAPY TREATMENT NOTE   Patient Name: Christina Henderson MRN: [id number] DOB:1935-10-20, 87 y.o., female Today's Date: 09/27/2022  PCP: Cari Caraway, MD  REFERRING PROVIDER: Delrae Rend, MD   END OF SESSION:   PT End of Session - 09/27/22 0901     Visit Number 2    Number of Visits 7    Date for PT Re-Evaluation 11/09/22    Authorization Type MEDICARE PART A AND B;  [employer]    PT Start Time [id number]    PT Stop Time 0932    PT Time Calculation (min) 39 min    Activity Tolerance Patient tolerated treatment well    Behavior During Therapy [facility] for tasks assessed/performed             Past Medical History:  Diagnosis Date   Arthritis    rhuematoid arthritis   Breast cancer ([place]) 2018   Right Breast Cancer   Cancer ([place]) 03/2017   right breast cancer   Complication of anesthesia    hard to wake up   GERD (gastroesophageal reflux disease)    food related   Glaucoma    Hypertension    Osteoporosis    osteopenia   Rosacea    Past Surgical History:  Procedure Laterality Date   APPENDECTOMY     BACK SURGERY     BREAST BIOPSY     BREAST LUMPECTOMY Right 04/2017   BREAST LUMPECTOMY WITH RADIOACTIVE SEED AND SENTINEL LYMPH NODE BIOPSY Bilateral 05/09/2017   Procedure: BILATERAL RADIOACTIVE SEED GUIDED LUMPECTOMIES WITH RIGHT SENTINEL LYMPH NODE BIOPSY;  Surgeon: Erroll Luna, MD;  Location: [place];  Service: General;  Laterality: Bilateral;   EYE SURGERY     cataract   SPINE SURGERY     lumbar lamenectomy   TONSILLECTOMY     Patient Active Problem List   Diagnosis Date Noted   Bradycardia 08/15/2022   Dyslipidemia 11/25/2019   Educated about COVID-19 virus infection 11/25/2019   Chest pain 07/17/2018   Cough 07/17/2018   Essential hypertension 07/17/2018   Malignant neoplasm of upper-outer quadrant of right breast in female, estrogen receptor positive ([place]) 05/23/2017    REFERRING DIAG: M81.0  (ICD-10-CM) - Age-related osteoporosis without current pathological fracture   THERAPY DIAG:  Acute bilateral low back pain with right-sided sciatica  Muscle weakness (generalized)  Rationale for Evaluation and Treatment Rehabilitation                                                                                                                                                                                   ONSET DATE: 3 weeks   SUBJECTIVE:  SUBJECTIVE STATEMENT: Pt reports her low back is doing well and not experiencing pain. She notes she has ben completing her HEP consistently   PERTINENT HISTORY: 1974 back surgery   PAIN:  Are you having pain?pain range Yes: NPRS scale: 0/10 Pain location: R low back and leg Pain description: ache, sharp  Aggravating factors: prolonged positions Relieving factors: changing positions, arthritis strength tylenol   PRECAUTIONS: None   WEIGHT BEARING RESTRICTIONS: No   FALLS:  Has patient fallen in last 6 months? No   LIVING ENVIRONMENT: Lives with: lives alone Lives in: House/apartment     OCCUPATION: Retired   PLOF: Independent   PATIENT GOALS: To be stronger and be steadier on her feet     OBJECTIVE: (objective measures completed at initial evaluation unless otherwise dated)   08/31/22 DIAGNOSTIC FINDINGS:  IMPRESSION: 1. Stable degenerative lumbar spondylosis. Advanced degenerative disc disease and facet disease. 2. No acute bony findings.   PATIENT SURVEYS:  FOTO: Perceived function   74%, predicted   82%    COGNITION: Overall cognitive status: Within functional limits for tasks assessed                         SENSATION: WFL   EDEMA:  None palpated   MUSCLE LENGTH: Hamstrings: Right WNLs deg; Left WNLs deg Marcello Moores test: Right NT deg; Left NT deg   POSTURE: rounded shoulders, forward head, decreased lumbar lordosis, and decreased thoracic kyphosis   PALPATION: TTP lumbar paraspinals   LUMBAR ROM:     Active  A/PROM  09/21/2022  Flexion Full   Extension Min limitation  Right lateral flexion Min limitation  Left lateral flexion Min limitation  Right rotation Full  Left rotation Full   (Blank rows = not tested)   LOWER EXTREMITY MMT:   MMT Right eval Left eval  Hip flexion 4 4  Hip extension 3+ 3+  Hip abduction 4 4  Hip adduction 4+ 4+  Hip internal rotation 4+ 4+  Hip external rotation 4 4  Knee flexion 4+ 4+  Knee extension 4+ 4+  Ankle dorsiflexion 4+ 4+  Ankle plantarflexion 4+ 4+  Ankle inversion      Ankle eversion       (Blank rows = not tested)   LOWER EXTREMITY ROM:   Active ROM Right eval Left eval  Hip flexion      Hip extension      Hip abduction      Hip adduction      Hip internal rotation      Hip external rotation      Knee flexion      Knee extension      Ankle dorsiflexion      Ankle plantarflexion      Ankle inversion      Ankle eversion       (Blank rows = not tested)   LOWER EXTREMITY SPECIAL TESTS:  NA   FUNCTIONAL TESTS:  5 times sit to stand: 10.4 sec s use of hands            SLS: L=4,6 sec- 5 sec; R= 2, 5 sec- 3sec GAIT: Distance walked: 267ft Assistive device utilized: None Level of assistance: Complete Independence Comments: WNLs     TODAY'S TREATMENT:  [facility] Adult PT Treatment:  DATE: 09/27/22 Therapeutic Exercise: Hooklying Single Knee to Chest  2 reps - 20 hold Heel Toe Raises with Counter Support  2 sets 10 reps - 2 hold Standing Single Leg Stance with Counter Support  3 reps - 20 hold Sit to Stand Without Arm Support  2 sets - 10 reps Shoulder extension with resistance 2 sets - 15 reps Standing Bicep Curls with Resistance  2 sets  -15 reps Standing hip abd 2x10 Bridging x10 5' H/L clams x10 5" PPT 2x10 Abdominal bracing x30 10" Updated HEP                                                                                                                               [facility] Adult PT Treatment:                                                DATE: 09/21/22 Therapeutic Exercise: Developed, instructed in, and pt completed therex as noted in HEP Self Care: Use of pillow between knees when sleeping in SL to minimize LBP     PATIENT EDUCATION:  Education details: Eval findings, POC, HEP, self care  Person educated: Patient Education method: Explanation, Demonstration, Tactile cues, Verbal cues, and Handouts Education comprehension: verbalized understanding, returned demonstration, verbal cues required, and tactile cues required   HOME EXERCISE PROGRAM: Access Code: [email or web address] URL: [email or web address] Date: 09/27/2022 Prepared by: Gar Ponto  Exercises - Hooklying Single Knee to Chest  - 1-2 x daily - 7 x weekly - 1 sets - 2 reps - 20 hold - Heel Toe Raises with Counter Support  - 1 x daily - 7 x weekly - 1-2 sets - 10-15 reps - 2 hold - Standing Single Leg Stance with Counter Support  - 1 x daily - 7 x weekly - 1-2 sets - 3-5 reps - 20 hold - Sit to Stand Without Arm Support  - 1 x daily - 7 x weekly - 1-2 sets - 10-15 reps - Shoulder extension with resistance - Neutral  - 1 x daily - 7 x weekly - 1-2 sets - 10-15 reps - Standing Bicep Curls with Resistance  - 1 x daily - 7 x weekly - 1-2 sets - 10-15 reps - Supine Bridge  - 1 x daily - 7 x weekly - 1-2 sets - 10 reps - 5 hold - Hooklying Clamshell with Resistance  - 1 x daily - 7 x weekly - 1-2 sets - 10 reps - 5 hold - Supine Posterior Pelvic Tilt  - 1 x daily - 7 x weekly - 12- sets - 10 reps  ASSESSMENT:   CLINICAL IMPRESSION: PT was completed for lumbopelvic strengthening/loading and balance therex. Strengthening exs included therex for the core muscles. Pt's HEP was updated. Pt returned demonstration of her HEP. Pt's report indicates consistent  completion of HEP since eval. Pt tolerated PT today without adverse effects. Pt will continue to benefit from skilled PT to address  impairments for improved function.   OBJECTIVE IMPAIRMENTS: decreased activity tolerance, decreased balance, decreased strength, and pain.    ACTIVITY LIMITATIONS: carrying, lifting, bending, and sitting   PARTICIPATION LIMITATIONS: cleaning, laundry, and shopping   PERSONAL FACTORS: Age, Past/current experiences, and Time since onset of injury/illness/exacerbation are also affecting patient's functional outcome.    REHAB POTENTIAL: Good   CLINICAL DECISION MAKING: Stable/uncomplicated   EVALUATION COMPLEXITY: Low     GOALS:   SHORT TERM GOALS=LTGs   LONG TERM GOALS: Target date: 11/09/22   Pt will be Ind in a final HEP to maintain achieved LOF  Baseline: initiated Goal status: INITIAL   2.  Pt will report a pain range of 0-4/10 for improved QOL Baseline: 0-9/10 Goal status: INITIAL   3.  Pt will demonstrate an increase SLS to 10 sec of greater for improved balance Baseline: see functional tests Goal status: INITIAL   4.  Increase blat hip strength to 4+/5 for improved functiona and balance Baseline: See flow sheets Goal status: INITIAL     PLAN:   PT FREQUENCY: 1x/week   PT DURATION: 6 weeks   PLANNED INTERVENTIONS: Therapeutic exercises, Therapeutic activity, Balance training, Gait training, Patient/Family education, Self Care, Joint mobilization, Dry Needling, Electrical stimulation, Cryotherapy, Moist heat, Taping, Ultrasound, Ionotophoresis 4mg /ml Dexamethasone, Manual therapy, and Re-evaluation   PLAN FOR NEXT SESSION: Review FOTO; assess response to HEP; progress therex as indicated; use of modalities, manual therapy; and TPDN as indicated.    Reia Viernes MS, PT 09/27/22 2:06 PM

## 2022-09-28 ENCOUNTER — Encounter: Payer: Self-pay | Admitting: Cardiology

## 2022-09-29 ENCOUNTER — Encounter: Payer: Medicare Other | Admitting: Physical Therapy

## 2022-09-29 DIAGNOSIS — M0570 Rheumatoid arthritis with rheumatoid factor of unspecified site without organ or systems involvement: Secondary | ICD-10-CM | POA: Diagnosis not present

## 2022-09-29 DIAGNOSIS — H401132 Primary open-angle glaucoma, bilateral, moderate stage: Secondary | ICD-10-CM | POA: Diagnosis not present

## 2022-09-29 DIAGNOSIS — H43813 Vitreous degeneration, bilateral: Secondary | ICD-10-CM | POA: Diagnosis not present

## 2022-09-29 DIAGNOSIS — H35033 Hypertensive retinopathy, bilateral: Secondary | ICD-10-CM | POA: Diagnosis not present

## 2022-09-29 DIAGNOSIS — Z79899 Other long term (current) drug therapy: Secondary | ICD-10-CM | POA: Diagnosis not present

## 2022-10-11 DIAGNOSIS — G4719 Other hypersomnia: Secondary | ICD-10-CM | POA: Diagnosis not present

## 2022-10-11 DIAGNOSIS — I10 Essential (primary) hypertension: Secondary | ICD-10-CM | POA: Diagnosis not present

## 2022-10-12 ENCOUNTER — Other Ambulatory Visit (HOSPITAL_COMMUNITY)
Admission: RE | Admit: 2022-10-12 | Discharge: 2022-10-12 | Disposition: A | Discharge status: Home / Self Care | Payer: Medicare Other | Source: Ambulatory Visit | Attending: Radiology | Admitting: Radiology

## 2022-10-12 ENCOUNTER — Ambulatory Visit
Admission: RE | Admit: 2022-10-12 | Discharge: 2022-10-12 | Disposition: A | Discharge status: Home / Self Care | Payer: Medicare Other | Source: Ambulatory Visit | Attending: Internal Medicine | Admitting: Internal Medicine

## 2022-10-12 DIAGNOSIS — E041 Nontoxic single thyroid nodule: Secondary | ICD-10-CM

## 2022-10-12 DIAGNOSIS — E0789 Other specified disorders of thyroid: Secondary | ICD-10-CM | POA: Diagnosis not present

## 2022-10-12 DIAGNOSIS — R896 Abnormal cytological findings in specimens from other organs, systems and tissues: Secondary | ICD-10-CM | POA: Insufficient documentation

## 2022-10-13 ENCOUNTER — Encounter: Payer: Self-pay | Admitting: Physical Therapy

## 2022-10-13 ENCOUNTER — Ambulatory Visit: Payer: Medicare Other | Attending: Internal Medicine | Admitting: Physical Therapy

## 2022-10-13 DIAGNOSIS — M5441 Lumbago with sciatica, right side: Secondary | ICD-10-CM | POA: Diagnosis not present

## 2022-10-13 DIAGNOSIS — M6281 Muscle weakness (generalized): Secondary | ICD-10-CM | POA: Diagnosis not present

## 2022-10-13 NOTE — Therapy (Signed)
OUTPATIENT PHYSICAL THERAPY TREATMENT NOTE   Patient Name: Christina Henderson MRN: [id number] DOB:1936/06/03, 87 y.o., female Today's Date: 10/13/2022  PCP: Cari Caraway, MD  REFERRING PROVIDER: Delrae Rend, MD   END OF SESSION:   PT End of Session - 10/13/22 1003     Visit Number 3    Number of Visits 7    Date for PT Re-Evaluation 11/09/22    Authorization Type MEDICARE PART A AND B;  [employer]    PT Start Time 1000    PT Stop Time 1041    PT Time Calculation (min) 41 min             Past Medical History:  Diagnosis Date   Arthritis    rhuematoid arthritis   Breast cancer 2018   Right Breast Cancer   Cancer 03/2017   right breast cancer   Complication of anesthesia    hard to wake up   GERD (gastroesophageal reflux disease)    food related   Glaucoma    Hypertension    Osteoporosis    osteopenia   Rosacea    Past Surgical History:  Procedure Laterality Date   APPENDECTOMY     BACK SURGERY     BREAST BIOPSY     BREAST LUMPECTOMY Right 04/2017   BREAST LUMPECTOMY WITH RADIOACTIVE SEED AND SENTINEL LYMPH NODE BIOPSY Bilateral 05/09/2017   Procedure: BILATERAL RADIOACTIVE SEED GUIDED LUMPECTOMIES WITH RIGHT SENTINEL LYMPH NODE BIOPSY;  Surgeon: Erroll Luna, MD;  Location: [place];  Service: General;  Laterality: Bilateral;   EYE SURGERY     cataract   SPINE SURGERY     lumbar lamenectomy   TONSILLECTOMY     Patient Active Problem List   Diagnosis Date Noted   Bradycardia 08/15/2022   Dyslipidemia 11/25/2019   Educated about COVID-19 virus infection 11/25/2019   Chest pain 07/17/2018   Cough 07/17/2018   Essential hypertension 07/17/2018   Malignant neoplasm of upper-outer quadrant of right breast in female, estrogen receptor positive 05/23/2017    REFERRING DIAG: M81.0 (ICD-10-CM) - Age-related osteoporosis without current pathological fracture   THERAPY DIAG:  Acute bilateral low back pain with  right-sided sciatica  Muscle weakness (generalized)  Rationale for Evaluation and Treatment Rehabilitation                                                                                                                                                                                   ONSET DATE: 3 weeks   SUBJECTIVE:    SUBJECTIVE STATEMENT: No pain right now. Pt reports compliance with HEP however some exercises are causing lingering pain in back.  PERTINENT HISTORY: 1974 back surgery   PAIN:  Are you having pain? pain range Yes: NPRS scale: 0/10 Pain location: R low back and leg Pain description: ache, sharp  Aggravating factors: prolonged positions Relieving factors: changing positions, arthritis strength tylenol   PRECAUTIONS: None   WEIGHT BEARING RESTRICTIONS: No   FALLS:  Has patient fallen in last 6 months? No   LIVING ENVIRONMENT: Lives with: lives alone Lives in: House/apartment     OCCUPATION: Retired   PLOF: Independent   PATIENT GOALS: To be stronger and be steadier on her feet     OBJECTIVE: (objective measures completed at initial evaluation unless otherwise dated)   08/31/22 DIAGNOSTIC FINDINGS:  IMPRESSION: 1. Stable degenerative lumbar spondylosis. Advanced degenerative disc disease and facet disease. 2. No acute bony findings.   PATIENT SURVEYS:  FOTO: Perceived function   74%, predicted   82%    COGNITION: Overall cognitive status: Within functional limits for tasks assessed                         SENSATION: WFL   EDEMA:  None palpated   MUSCLE LENGTH: Hamstrings: Right WNLs deg; Left WNLs deg Marcello Moores test: Right NT deg; Left NT deg   POSTURE: rounded shoulders, forward head, decreased lumbar lordosis, and decreased thoracic kyphosis   PALPATION: TTP lumbar paraspinals   LUMBAR ROM:    Active  A/PROM  09/21/2022  Flexion Full   Extension Min limitation  Right lateral flexion Min limitation  Left lateral flexion Min  limitation  Right rotation Full  Left rotation Full   (Blank rows = not tested)   LOWER EXTREMITY MMT:   MMT Right eval Left eval  Hip flexion 4 4  Hip extension 3+ 3+  Hip abduction 4 4  Hip adduction 4+ 4+  Hip internal rotation 4+ 4+  Hip external rotation 4 4  Knee flexion 4+ 4+  Knee extension 4+ 4+  Ankle dorsiflexion 4+ 4+  Ankle plantarflexion 4+ 4+  Ankle inversion      Ankle eversion       (Blank rows = not tested)   LOWER EXTREMITY ROM:   Active ROM Right eval Left eval  Hip flexion      Hip extension      Hip abduction      Hip adduction      Hip internal rotation      Hip external rotation      Knee flexion      Knee extension      Ankle dorsiflexion      Ankle plantarflexion      Ankle inversion      Ankle eversion       (Blank rows = not tested)   LOWER EXTREMITY SPECIAL TESTS:  NA   FUNCTIONAL TESTS:  5 times sit to stand: 10.4 sec s use of hands            SLS: L=4,6 sec- 5 sec; R= 2, 5 sec- 3sec  10/13/22: SLS L & R = 9 sec best bilateral  GAIT: Distance walked: 264ft Assistive device utilized: None Level of assistance: Complete Independence Comments: WNLs     TODAY'S TREATMENT:  [facility] Adult PT Treatment:  DATE: 10/13/22 Therapeutic Exercise: Review of HEP SLS trials 9 sec best Tandem > 30 each  Side clam green band +HEP PPT and Bridge discontinued for now  Supine March vs SLR (with optional UE reach to shin for ab contraction) +HEP Single knee to chest     [facility] Adult PT Treatment:                                                DATE: 09/27/22 Therapeutic Exercise: Hooklying Single Knee to Chest  2 reps - 20 hold Heel Toe Raises with Counter Support  2 sets 10 reps - 2 hold Standing Single Leg Stance with Counter Support  3 reps - 20 hold Sit to Stand Without Arm Support  2 sets - 10 reps Shoulder extension with resistance 2 sets - 15 reps Standing Bicep Curls with Resistance  2 sets   -15 reps Standing hip abd 2x10 Bridging x10 5' H/L clams x10 5" PPT 2x10 Abdominal bracing x30 10" Updated HEP                                                                                                                              [facility] Adult PT Treatment:                                                DATE: 09/21/22 Therapeutic Exercise: Developed, instructed in, and pt completed therex as noted in HEP Self Care: Use of pillow between knees when sleeping in SL to minimize LBP     PATIENT EDUCATION:  Education details: Eval findings, POC, HEP, self care  Person educated: Patient Education method: Explanation, Demonstration, Tactile cues, Verbal cues, and Handouts Education comprehension: verbalized understanding, returned demonstration, verbal cues required, and tactile cues required   HOME EXERCISE PROGRAM: Access Code: [email or web address] URL: [email or web address] Date: 09/27/2022 Prepared by: Gar Ponto  Exercises - Hooklying Single Knee to Chest  - 1-2 x daily - 7 x weekly - 1 sets - 2 reps - 20 hold - Heel Toe Raises with Counter Support  - 1 x daily - 7 x weekly - 1-2 sets - 10-15 reps - 2 hold - Standing Single Leg Stance with Counter Support  - 1 x daily - 7 x weekly - 1-2 sets - 3-5 reps - 20 hold - Sit to Stand Without Arm Support  - 1 x daily - 7 x weekly - 1-2 sets - 10-15 reps - Shoulder extension with resistance - Neutral  - 1 x daily - 7 x weekly - 1-2 sets - 10-15 reps - Standing Bicep Curls with Resistance  - 1 x daily - 7 x weekly - 1-2 sets - 10-15  reps - Supine Bridge  - 1 x daily - 7 x weekly - 1-2 sets - 10 reps - 5 hold - Hooklying Clamshell with Resistance  - 1 x daily - 7 x weekly - 1-2 sets - 10 reps - 5 hold - Supine Posterior Pelvic Tilt  - 1 x daily - 7 x weekly - 12- sets - 10 reps Added 10/13/22 - Active straight leg raise  - 1 x daily - 7 x weekly - 1-2 sets - 10 reps - Clamshell with Resistance  - 1 x daily - 7 x weekly - 1-2 sets - 10-15  reps - slow hold  ASSESSMENT:   CLINICAL IMPRESSION: Pt arrives reporting compliance with HEP however PPT and Bridge causes her to have increased back pain for a couple of days so she will rest. Her SLS time has improved. Trial of core activation maintaining neutral spine (supine marching vs SLR with opp arm reach). Progressed lateral hip strength to sidelying clam which she requested to add to HEP.  She will try these at home this week and see if her back pain is better. She did have some low back discomfort at end of session which decreased with SKTC stretches.  Pt will continue to benefit from skilled PT to address impairments for improved function.   OBJECTIVE IMPAIRMENTS: decreased activity tolerance, decreased balance, decreased strength, and pain.    ACTIVITY LIMITATIONS: carrying, lifting, bending, and sitting   PARTICIPATION LIMITATIONS: cleaning, laundry, and shopping   PERSONAL FACTORS: Age, Past/current experiences, and Time since onset of injury/illness/exacerbation are also affecting patient's functional outcome.    REHAB POTENTIAL: Good   CLINICAL DECISION MAKING: Stable/uncomplicated   EVALUATION COMPLEXITY: Low     GOALS:   SHORT TERM GOALS=LTGs   LONG TERM GOALS: Target date: 11/09/22   Pt will be Ind in a final HEP to maintain achieved LOF  Baseline: initiated Goal status: INITIAL   2.  Pt will report a pain range of 0-4/10 for improved QOL Baseline: 0-9/10 10/13/22:  Goal status: INITIAL   3.  Pt will demonstrate an increase SLS to 10 sec of greater for improved balance Baseline: see functional tests 10/13/22: 9 sec best Goal status: ONGOING   4.  Increase blat hip strength to 4+/5 for improved functiona and balance Baseline: See flow sheets Goal status: INITIAL     PLAN:   PT FREQUENCY: 1x/week   PT DURATION: 6 weeks   PLANNED INTERVENTIONS: Therapeutic exercises, Therapeutic activity, Balance training, Gait training, Patient/Family education, Self  Care, Joint mobilization, Dry Needling, Electrical stimulation, Cryotherapy, Moist heat, Taping, Ultrasound, Ionotophoresis 4mg /ml Dexamethasone, Manual therapy, and Re-evaluation   PLAN FOR NEXT SESSION: Review FOTO; assess response to HEP; progress therex as indicated; use of modalities, manual therapy; and TPDN as indicated.    Hessie Diener, PTA 10/13/22 10:50 AM Phone: [phone] Fax: [phone]

## 2022-10-14 LAB — CYTOLOGY - NON PAP

## 2022-10-17 DIAGNOSIS — M810 Age-related osteoporosis without current pathological fracture: Secondary | ICD-10-CM | POA: Diagnosis not present

## 2022-10-17 DIAGNOSIS — E041 Nontoxic single thyroid nodule: Secondary | ICD-10-CM | POA: Diagnosis not present

## 2022-10-17 DIAGNOSIS — M069 Rheumatoid arthritis, unspecified: Secondary | ICD-10-CM | POA: Diagnosis not present

## 2022-10-17 DIAGNOSIS — Z853 Personal history of malignant neoplasm of breast: Secondary | ICD-10-CM | POA: Diagnosis not present

## 2022-10-17 DIAGNOSIS — Z8639 Personal history of other endocrine, nutritional and metabolic disease: Secondary | ICD-10-CM | POA: Diagnosis not present

## 2022-10-17 DIAGNOSIS — E785 Hyperlipidemia, unspecified: Secondary | ICD-10-CM | POA: Diagnosis not present

## 2022-10-17 DIAGNOSIS — M85852 Other specified disorders of bone density and structure, left thigh: Secondary | ICD-10-CM | POA: Diagnosis not present

## 2022-10-20 ENCOUNTER — Ambulatory Visit: Payer: Medicare Other | Admitting: Physical Therapy

## 2022-10-20 ENCOUNTER — Encounter: Payer: Self-pay | Admitting: Physical Therapy

## 2022-10-20 DIAGNOSIS — M5441 Lumbago with sciatica, right side: Secondary | ICD-10-CM

## 2022-10-20 DIAGNOSIS — M6281 Muscle weakness (generalized): Secondary | ICD-10-CM | POA: Diagnosis not present

## 2022-10-20 NOTE — Therapy (Signed)
OUTPATIENT PHYSICAL THERAPY TREATMENT NOTE   Patient Name: Christina Henderson MRN: [id number] DOB:11-Feb-1936, 87 y.o., female Today's Date: 10/20/2022  PCP: Gweneth Dimitri, MD  REFERRING PROVIDER: Talmage Coin, MD   END OF SESSION:   PT End of Session - 10/20/22 1008     Visit Number 4    Number of Visits 7    Date for PT Re-Evaluation 11/09/22    Authorization Type MEDICARE PART A AND B;  [employer]    PT Start Time 1012    PT Stop Time 1057    PT Time Calculation (min) 45 min             Past Medical History:  Diagnosis Date   Arthritis    rhuematoid arthritis   Breast cancer 2018   Right Breast Cancer   Cancer 03/2017   right breast cancer   Complication of anesthesia    hard to wake up   GERD (gastroesophageal reflux disease)    food related   Glaucoma    Hypertension    Osteoporosis    osteopenia   Rosacea    Past Surgical History:  Procedure Laterality Date   APPENDECTOMY     BACK SURGERY     BREAST BIOPSY     BREAST LUMPECTOMY Right 04/2017   BREAST LUMPECTOMY WITH RADIOACTIVE SEED AND SENTINEL LYMPH NODE BIOPSY Bilateral 05/09/2017   Procedure: BILATERAL RADIOACTIVE SEED GUIDED LUMPECTOMIES WITH RIGHT SENTINEL LYMPH NODE BIOPSY;  Surgeon: Harriette Bouillon, MD;  Location: [facility];  Service: General;  Laterality: Bilateral;   EYE SURGERY     cataract   SPINE SURGERY     lumbar lamenectomy   TONSILLECTOMY     Patient Active Problem List   Diagnosis Date Noted   Bradycardia 08/15/2022   Dyslipidemia 11/25/2019   Educated about COVID-19 virus infection 11/25/2019   Chest pain 07/17/2018   Cough 07/17/2018   Essential hypertension 07/17/2018   Malignant neoplasm of upper-outer quadrant of right breast in female, estrogen receptor positive 05/23/2017    REFERRING DIAG: M81.0 (ICD-10-CM) - Age-related osteoporosis without current pathological fracture   THERAPY DIAG:  Acute bilateral low back pain with  right-sided sciatica  Muscle weakness (generalized)  Rationale for Evaluation and Treatment Rehabilitation                                                                                                                                                                                   ONSET DATE: 3 weeks   SUBJECTIVE:    SUBJECTIVE STATEMENT: No pain right now. I always wake up in the morning and stand up straight. It relieves my back  pain.     PERTINENT HISTORY: 1974 back surgery   PAIN:  Are you having pain? pain range Yes: NPRS scale: 0/10 Pain location: R low back and leg Pain description: ache, sharp  Aggravating factors: prolonged positions Relieving factors: changing positions, arthritis strength tylenol   PRECAUTIONS: None   WEIGHT BEARING RESTRICTIONS: No   FALLS:  Has patient fallen in last 6 months? No   LIVING ENVIRONMENT: Lives with: lives alone Lives in: House/apartment     OCCUPATION: Retired   PLOF: Independent   PATIENT GOALS: To be stronger and be steadier on her feet     OBJECTIVE: (objective measures completed at initial evaluation unless otherwise dated)   08/31/22 DIAGNOSTIC FINDINGS:  IMPRESSION: 1. Stable degenerative lumbar spondylosis. Advanced degenerative disc disease and facet disease. 2. No acute bony findings.   PATIENT SURVEYS:  FOTO: Perceived function   74%, predicted   82%    COGNITION: Overall cognitive status: Within functional limits for tasks assessed                         SENSATION: WFL   EDEMA:  None palpated   MUSCLE LENGTH: Hamstrings: Right WNLs deg; Left WNLs deg Maisie Fus test: Right NT deg; Left NT deg   POSTURE: rounded shoulders, forward head, decreased lumbar lordosis, and decreased thoracic kyphosis   PALPATION: TTP lumbar paraspinals   LUMBAR ROM:    Active  A/PROM  09/21/2022  Flexion Full   Extension Min limitation  Right lateral flexion Min limitation  Left lateral flexion Min  limitation  Right rotation Full  Left rotation Full   (Blank rows = not tested)   LOWER EXTREMITY MMT:   MMT Right eval Left eval  Hip flexion 4 4  Hip extension 3+ 3+  Hip abduction 4 4  Hip adduction 4+ 4+  Hip internal rotation 4+ 4+  Hip external rotation 4 4  Knee flexion 4+ 4+  Knee extension 4+ 4+  Ankle dorsiflexion 4+ 4+  Ankle plantarflexion 4+ 4+  Ankle inversion      Ankle eversion       (Blank rows = not tested)   LOWER EXTREMITY ROM:   Active ROM Right eval Left eval  Hip flexion      Hip extension      Hip abduction      Hip adduction      Hip internal rotation      Hip external rotation      Knee flexion      Knee extension      Ankle dorsiflexion      Ankle plantarflexion      Ankle inversion      Ankle eversion       (Blank rows = not tested)   LOWER EXTREMITY SPECIAL TESTS:  NA   FUNCTIONAL TESTS:  5 times sit to stand: 10.4 sec s use of hands            SLS: L=4,6 sec- 5 sec; R= 2, 5 sec- 3sec  10/13/22: SLS L & R = 9 sec best bilateral   10/20/22: 11 sec best on left GAIT: Distance walked: 237ft Assistive device utilized: None Level of assistance: Complete Independence Comments: WNLs     TODAY'S TREATMENT:  [facility] Adult PT Treatment:  DATE: 10/20/22 Therapeutic Exercise: Standing heel/toe raises SLS trials  Tandem trials Tandem on foam Green band rows and ext Green band supine and S/L clam reviewed    Qped hip ext - vs using bolster for UE vs forearms on counter  SKTC   Therapeutic Activity: Log roll for mat transfers to reduce back pain    [facility] Adult PT Treatment:                                                DATE: 10/13/22 Therapeutic Exercise: Review of HEP SLS trials 9 sec best Tandem > 30 each  Side clam green band +HEP PPT and Bridge discontinued for now  Supine March vs SLR (with optional UE reach to shin for ab contraction) +HEP Single knee to chest     [facility]                                                DATE: 09/27/22 Therapeutic Exercise: Hooklying Single Knee to Chest  2 reps - 20 hold Heel Toe Raises with Counter Support  2 sets 10 reps - 2 hold Standing Single Leg Stance with Counter Support  3 reps - 20 hold Sit to Stand Without Arm Support  2 sets - 10 reps Shoulder extension with resistance 2 sets - 15 reps Standing Bicep Curls with Resistance  2 sets  -15 reps Standing hip abd 2x10 Bridging x10 5' H/L clams x10 5" PPT 2x10 Abdominal bracing x30 10" Updated HEP                                                                                                                              [facility] Adult PT Treatment:                                                DATE: 09/21/22 Therapeutic Exercise: Developed, instructed in, and pt completed therex as noted in HEP Self Care: Use of pillow between knees when sleeping in SL to minimize LBP     PATIENT EDUCATION:  Education details: Eval findings, POC, HEP, self care  Person educated: Patient Education method: Explanation, Demonstration, Tactile cues, Verbal cues, and Handouts Education comprehension: verbalized understanding, returned demonstration, verbal cues required, and tactile cues required   HOME EXERCISE PROGRAM: Access Code: [email or web address] URL: [email or web address] Date: 09/27/2022 Prepared by: Joellyn RuedAllen Ralls  Exercises - Hooklying Single Knee to Chest  - 1-2 x daily - 7 x weekly - 1 sets - 2 reps - 20 hold - Heel Toe  Raises with Counter Support  - 1 x daily - 7 x weekly - 1-2 sets - 10-15 reps - 2 hold - Standing Single Leg Stance with Counter Support  - 1 x daily - 7 x weekly - 1-2 sets - 3-5 reps - 20 hold - Sit to Stand Without Arm Support  - 1 x daily - 7 x weekly - 1-2 sets - 10-15 reps - Shoulder extension with resistance - Neutral  - 1 x daily - 7 x weekly - 1-2 sets - 10-15 reps - Standing Bicep Curls with Resistance  - 1 x daily - 7 x weekly - 1-2  sets - 10-15 reps - Supine Bridge  - 1 x daily - 7 x weekly - 1-2 sets - 10 reps - 5 hold - Hooklying Clamshell with Resistance  - 1 x daily - 7 x weekly - 1-2 sets - 10 reps - 5 hold - Supine Posterior Pelvic Tilt  - 1 x daily - 7 x weekly - 12- sets - 10 reps Added 10/13/22 - Active straight leg raise  - 1 x daily - 7 x weekly - 1-2 sets - 10 reps - Clamshell with Resistance  - 1 x daily - 7 x weekly - 1-2 sets - 10-15 reps - slow hold  ASSESSMENT:   CLINICAL IMPRESSION: Pt arrives reporting compliance with HEP and improvement after discontinuing the PPT and Bridge exercises. Tried alternate gluteal strengthening today with best option at counter top while leaning on counter. She lacks full active hip extension due to gluteal weakness.  This was added to HEP and she denied increased back pain. Her SLS improved to 11 sec on left, partially meeting the LTG #3. She reports back pain is present every morning but resolves with standing up straight. Leg pain occurs only with prolonged sitting. Overall her goal is to be stronger and steady on feet. Back pain is not her primary concern.  Pt will continue to benefit from skilled PT to address impairments for improved function.   OBJECTIVE IMPAIRMENTS: decreased activity tolerance, decreased balance, decreased strength, and pain.    ACTIVITY LIMITATIONS: carrying, lifting, bending, and sitting   PARTICIPATION LIMITATIONS: cleaning, laundry, and shopping   PERSONAL FACTORS: Age, Past/current experiences, and Time since onset of injury/illness/exacerbation are also affecting patient's functional outcome.    REHAB POTENTIAL: Good   CLINICAL DECISION MAKING: Stable/uncomplicated   EVALUATION COMPLEXITY: Low     GOALS:   SHORT TERM GOALS=LTGs   LONG TERM GOALS: Target date: 11/09/22   Pt will be Ind in a final HEP to maintain achieved LOF  Baseline: initiated Goal status: ONGOING   2.  Pt will report a pain range of 0-4/10 for improved  QOL Baseline: 0-9/10 10/13/22: can get bad if sits too long Goal status: ONGOING   3.  Pt will demonstrate an increase SLS to 10 sec of greater for improved balance Baseline: see functional tests 10/13/22: 9 sec best 10/20/22: 11 sec on left Goal status: Partially met    4.  Increase blat hip strength to 4+/5 for improved functiona and balance Baseline: See flow sheets Goal status: ONGOING     PLAN:   PT FREQUENCY: 1x/week   PT DURATION: 6 weeks   PLANNED INTERVENTIONS: Therapeutic exercises, Therapeutic activity, Balance training, Gait training, Patient/Family education, Self Care, Joint mobilization, Dry Needling, Electrical stimulation, Cryotherapy, Moist heat, Taping, Ultrasound, Ionotophoresis 4mg /ml Dexamethasone, Manual therapy, and Re-evaluation   PLAN FOR NEXT SESSION: assess response to HEP and  progress to final HEP;  FOTO and DC when appropriate    Jannette Spanner, PTA 10/20/22 11:08 AM Phone: [phone] Fax: [phone]

## 2022-10-21 ENCOUNTER — Encounter: Payer: Self-pay | Admitting: Student

## 2022-10-21 ENCOUNTER — Ambulatory Visit: Payer: Medicare Other | Attending: Cardiology | Admitting: Student

## 2022-10-21 VITALS — BP 144/75 | HR 60

## 2022-10-21 DIAGNOSIS — I10 Essential (primary) hypertension: Secondary | ICD-10-CM | POA: Diagnosis not present

## 2022-10-21 NOTE — Patient Instructions (Signed)
No Changes in BP medications made by your pharmacist Carmela Hurt, PharmD at today's visit:    Bring all of your meds, your BP cuff and your record of home blood pressures to your next appointment.    HOW TO TAKE YOUR BLOOD PRESSURE AT HOME  Rest 5 minutes before taking your blood pressure.  Don't smoke or drink caffeinated beverages for at least 30 minutes before. Take your blood pressure before (not after) you eat. Sit comfortably with your back supported and both feet on the floor (don't cross your legs). Elevate your arm to heart level on a table or a desk. Use the proper sized cuff. It should fit smoothly and snugly around your bare upper arm. There should be enough room to slip a fingertip under the cuff. The bottom edge of the cuff should be 1 inch above the crease of the elbow. Ideally, take 3 measurements at one sitting and record the average.  Important lifestyle changes to control high blood pressure  Intervention  Effect on the BP  Lose extra pounds and watch your waistline Weight loss is one of the most effective lifestyle changes for controlling blood pressure. If you're overweight or obese, losing even a small amount of weight can help reduce blood pressure. Blood pressure might go down by about 1 millimeter of mercury (mm Hg) with each kilogram (about 2.2 pounds) of weight lost.  Exercise regularly As a general goal, aim for at least 30 minutes of moderate physical activity every day. Regular physical activity can lower high blood pressure by about 5 to 8 mm Hg.  Eat a healthy diet Eating a diet rich in whole grains, fruits, vegetables, and low-fat dairy products and low in saturated fat and cholesterol. A healthy diet can lower high blood pressure by up to 11 mm Hg.  Reduce salt (sodium) in your diet Even a small reduction of sodium in the diet can improve heart health and reduce high blood pressure by about 5 to 6 mm Hg.  Limit alcohol One drink equals 12 ounces of beer,  5 ounces of wine, or 1.5 ounces of 80-proof liquor.  Limiting alcohol to less than one drink a day for women or two drinks a day for men can help lower blood pressure by about 4 mm Hg.   If you have any questions or concerns please use My Chart to send questions or call the office at [phone]

## 2022-10-21 NOTE — Assessment & Plan Note (Signed)
Assessment: BP is uncontrolled in office 1st  BP 162/73 upon repeat measurement it went down to 144/75 with heart rate 60  Takes and tolerates current BP medications well without any side effects  Denies SOB, palpitation, chest pain, headaches,or swelling Reports some dizziness but no falls  Report having white coat syndrome; home morning BP 120/70  and afternoon ~130-140/60-65    Plan:  Given white coat syndrome and advance age with slightly above goal BP at home not changing any medications  Continue taking Amlodipine 10 mg daily, hydralazine 30 mg every morning hydralazine 40 mg twice daily times daily (afternoon doses) , olmesartan 40 mg daily   in future reasonable to consider thiazide diuretics if BP remains elevated Patient to keep record of BP readings with heart rate and report to [place] at the next visit Patient to see PharmD in 5-6 weeks for follow up  Follow up lab(s): none

## 2022-10-21 NOTE — Progress Notes (Signed)
Patient ID: Christina Henderson                 DOB: 1935/11/01                      MRN: [id number]      HPI: Christina Henderson is a 87 y.o. female referred by Dr. Antoine Poche to HTN clinic. PMH is significant for HTN and HDL, bradycardia, GERD, hx of breast cancer, glaucoma. At he last visit with Dr. Antoine Poche on 08/17/2022 diltiazem was d/c and amlodipine dose was increased to 10 mg, hydralazine 10 mg Q8h prn were changed to scheduled 10 mg Q8h. On 09/15/2022 due to elevated afternoon BP at home hydralazine dose was titrated to 30 mg every morning and 40 mg twice 8 hours apart in the afternoon.   Patient presented today for HTN follow up. Patient reports her BP in the afternoon has improved a lot since she started taking 40 mg dose of hydralazin. Reports her BP always high in the office. Her home BP on validated cuff ~120/70 in the morning and the afternoon readings runs ~130-140/60-65 range. She lives in nursing home; meals are provided so does not have control on salt intake for cooked food but she does not eat any salty snacks or add salt to her cooked food. She swims 3-4 times per week for 45 min. She takes her BP regularly and tolerates them well. Occasionally get dizzy when moving too fast but denies SOB, palpitation, swelling. She has metal like taste from one of her BP; thinks it is hydralazine but as BP getting better after titrating the dose  of hydralazine so does not want to change anything.     Current HTN meds: Amlodipine 10 mg daily, hydralazine 30 mg every morning hydralazine 40 mg twice daily times daily (afternoon doses) , olmesartan 40 mg daily  Previously tried: diltiazem 180 mg daily, losartan 100 mg daily  BP goal: <130/80   Social History:  Smoking: quit Jan 1970   Diet: does not add salt to her cooked food.  Exercise: swimming 40 min  3-4 times per week    Home BP readings: morning BP 120 /70 afternoon ~130-140/60-65 heart rate 61    Wt Readings from Last 3  Encounters:  08/17/22 142 lb 12.8 oz (64.8 kg)  05/27/22 142 lb (64.4 kg)  07/26/21 138 lb 9.6 oz (62.9 kg)   BP Readings from Last 3 Encounters:  10/21/22 (!) 144/75  08/17/22 (!) 148/78  05/27/22 (!) 146/59   Pulse Readings from Last 3 Encounters:  10/21/22 60  08/17/22 (!) 54  05/27/22 64    Renal function: CrCl cannot be calculated (Patient's most recent lab result is older than the maximum 21 days allowed.).  Past Medical History:  Diagnosis Date   Arthritis    rhuematoid arthritis   Breast cancer 2018   Right Breast Cancer   Cancer 03/2017   right breast cancer   Complication of anesthesia    hard to wake up   GERD (gastroesophageal reflux disease)    food related   Glaucoma    Hypertension    Osteoporosis    osteopenia   Rosacea     Current Outpatient Medications on File Prior to Visit  Medication Sig Dispense Refill   amLODipine (NORVASC) 10 MG tablet Take 1 tablet (10 mg total) by mouth at bedtime. 90 tablet 3   anastrozole (ARIMIDEX) 1 MG tablet Take 1 tablet (1 mg total) by  mouth daily. 90 tablet 3   fluocinonide cream (LIDEX) 0.05 % Apply 1 application topically 2 (two) times daily.     hydrALAZINE (APRESOLINE) 10 MG tablet Take 40 mg by mouth in the morning and at bedtime. 8 hours apart in the afternoon ( takes 30 mg in the morning ) (30 mg - 40 mg -40 mg )     hydroxychloroquine (PLAQUENIL) 200 MG tablet Take 2 tablets (400 mg total) by mouth daily.     ibandronate (BONIVA) 150 MG tablet Take 1 tablet by mouth every 30 (thirty) days.     Multiple Vitamin (MULTIVITAMIN WITH MINERALS) TABS tablet Take 1 tablet by mouth daily.     olmesartan (BENICAR) 40 MG tablet Take 1 tablet (40 mg total) by mouth in the morning. 90 tablet 3   Probiotic Product (PROBIOTIC ACIDOPHILUS BIOBEADS) CAPS Take by mouth daily.      rosuvastatin (CRESTOR) 10 MG tablet Take 10 mg by mouth daily.     COVID-19 mRNA bivalent vaccine, Moderna, (MODERNA COVID-19 BIVAL BOOSTER) 50  MCG/0.5ML injection Inject into the muscle. 0.5 mL 0   COVID-19 mRNA vaccine, Moderna, 100 MCG/0.5ML SUSP Moderna COVID-19 Vaccine (PF) 100 mcg/0.5 mL intramuscular susp. (EUA)  PHARMACY ADMINISTERED     hydrALAZINE (APRESOLINE) 10 MG tablet Take 3 tablets (30 mg total) by mouth every 8 (eight) hours. (Patient taking differently: Take 30 mg by mouth daily. In the morning) 270 tablet 2   No current facility-administered medications on file prior to visit.    Allergies  Allergen Reactions   Caffeine     Triggers rosacea   Statins Other (See Comments)    Makes her feel stupid   Sulfa Antibiotics Other (See Comments)    Not sure, childhood allergy    Blood pressure (!) 144/75, pulse 60, SpO2 98 %.   Essential hypertension Assessment: BP is uncontrolled in office 1st  BP 162/73 upon repeat measurement it went down to 144/75 with heart rate 60  Takes and tolerates current BP medications well without any side effects  Denies SOB, palpitation, chest pain, headaches,or swelling Reports some dizziness but no falls  Report having white coat syndrome; home morning BP 120/70  and afternoon ~130-140/60-65    Plan:  Given white coat syndrome and advance age with slightly above goal BP at home not changing any medications  Continue taking Amlodipine 10 mg daily, hydralazine 30 mg every morning hydralazine 40 mg twice daily times daily (afternoon doses) , olmesartan 40 mg daily   in future reasonable to consider thiazide diuretics if BP remains elevated Patient to keep record of BP readings with heart rate and report to [place] at the next visit Patient to see PharmD in 5-6 weeks for follow up  Follow up lab(s): none       Thank you  Carmela Hurt, Pharm.D [facility] A Division of [facility] [address] [place], [place] [address]  Phone: [phone]; Fax: [phone]

## 2022-10-26 ENCOUNTER — Encounter (HOSPITAL_COMMUNITY): Payer: Self-pay

## 2022-10-27 NOTE — Therapy (Signed)
OUTPATIENT PHYSICAL THERAPY TREATMENT NOTE   Patient Name: Christina Henderson MRN: [id number] DOB:April 26, 1936, 87 y.o., female Today's Date: 10/28/2022  PCP: Gweneth Dimitri, MD  REFERRING PROVIDER: Talmage Coin, MD   END OF SESSION:   PT End of Session - 10/28/22 1322     Visit Number 5    Number of Visits 7    Date for PT Re-Evaluation 11/09/22    Authorization Type MEDICARE PART A AND B;  [employer]    PT Start Time 1145    PT Stop Time 1228    PT Time Calculation (min) 43 min    Activity Tolerance Patient tolerated treatment well    Behavior During Therapy WFL for tasks assessed/performed             Past Medical History:  Diagnosis Date   Arthritis    rhuematoid arthritis   Breast cancer 2018   Right Breast Cancer   Cancer 03/2017   right breast cancer   Complication of anesthesia    hard to wake up   GERD (gastroesophageal reflux disease)    food related   Glaucoma    Hypertension    Osteoporosis    osteopenia   Rosacea    Past Surgical History:  Procedure Laterality Date   APPENDECTOMY     BACK SURGERY     BREAST BIOPSY     BREAST LUMPECTOMY Right 04/2017   BREAST LUMPECTOMY WITH RADIOACTIVE SEED AND SENTINEL LYMPH NODE BIOPSY Bilateral 05/09/2017   Procedure: BILATERAL RADIOACTIVE SEED GUIDED LUMPECTOMIES WITH RIGHT SENTINEL LYMPH NODE BIOPSY;  Surgeon: Harriette Bouillon, MD;  Location: [facility];  Service: General;  Laterality: Bilateral;   EYE SURGERY     cataract   SPINE SURGERY     lumbar lamenectomy   TONSILLECTOMY     Patient Active Problem List   Diagnosis Date Noted   Bradycardia 08/15/2022   Dyslipidemia 11/25/2019   Educated about COVID-19 virus infection 11/25/2019   Chest pain 07/17/2018   Cough 07/17/2018   Essential hypertension 07/17/2018   Malignant neoplasm of upper-outer quadrant of right breast in female, estrogen receptor positive 05/23/2017    REFERRING DIAG: M81.0 (ICD-10-CM) -  Age-related osteoporosis without current pathological fracture   THERAPY DIAG:  Acute bilateral low back pain with right-sided sciatica  Muscle weakness (generalized)  Rationale for Evaluation and Treatment Rehabilitation                                                                                                                                                                                   ONSET DATE: 3 weeks   SUBJECTIVE:    SUBJECTIVE STATEMENT:  Pt reports at her ind living facility she used to hold on the the hall handrails for security, but now she feels more confident with her balance not to do so.    PERTINENT HISTORY: 1974 back surgery   PAIN:  Are you having pain? pain range Yes: NPRS scale: 0/10 Pain location: R low back and leg Pain description: ache, sharp  Aggravating factors: prolonged positions Relieving factors: changing positions, arthritis strength tylenol   PRECAUTIONS: None   WEIGHT BEARING RESTRICTIONS: No   FALLS:  Has patient fallen in last 6 months? No   LIVING ENVIRONMENT: Lives with: lives alone Lives in: House/apartment     OCCUPATION: Retired   PLOF: Independent   PATIENT GOALS: To be stronger and be steadier on her feet     OBJECTIVE: (objective measures completed at initial evaluation unless otherwise dated)   08/31/22 DIAGNOSTIC FINDINGS:  IMPRESSION: 1. Stable degenerative lumbar spondylosis. Advanced degenerative disc disease and facet disease. 2. No acute bony findings.   PATIENT SURVEYS:  FOTO: Perceived function   74%, predicted   82%    COGNITION: Overall cognitive status: Within functional limits for tasks assessed                         SENSATION: WFL   EDEMA:  None palpated   MUSCLE LENGTH: Hamstrings: Right WNLs deg; Left WNLs deg Maisie Fus test: Right NT deg; Left NT deg   POSTURE: rounded shoulders, forward head, decreased lumbar lordosis, and decreased thoracic kyphosis   PALPATION: TTP lumbar  paraspinals   LUMBAR ROM:    Active  A/PROM  09/21/2022  Flexion Full   Extension Min limitation  Right lateral flexion Min limitation  Left lateral flexion Min limitation  Right rotation Full  Left rotation Full   (Blank rows = not tested)   LOWER EXTREMITY MMT:   MMT Right eval Left eval  Hip flexion 4 4  Hip extension 3+ 3+  Hip abduction 4 4  Hip adduction 4+ 4+  Hip internal rotation 4+ 4+  Hip external rotation 4 4  Knee flexion 4+ 4+  Knee extension 4+ 4+  Ankle dorsiflexion 4+ 4+  Ankle plantarflexion 4+ 4+  Ankle inversion      Ankle eversion       (Blank rows = not tested)   LOWER EXTREMITY ROM:   Active ROM Right eval Left eval  Hip flexion      Hip extension      Hip abduction      Hip adduction      Hip internal rotation      Hip external rotation      Knee flexion      Knee extension      Ankle dorsiflexion      Ankle plantarflexion      Ankle inversion      Ankle eversion       (Blank rows = not tested)   LOWER EXTREMITY SPECIAL TESTS:  NA   FUNCTIONAL TESTS:  5 times sit to stand: 10.4 sec s use of hands            SLS: L=4,6 sec- 5 sec; R= 2, 5 sec- 3sec  10/13/22: SLS L & R = 9 sec best bilateral   10/20/22: 11 sec best on left GAIT: Distance walked: 224ft Assistive device utilized: None Level of assistance: Complete Independence Comments: WNLs     TODAY'S TREATMENT:  [facility] Adult PT Treatment:  DATE: 10/1922 Therapeutic Exercise: S/L hip clams 2x10 GTB each  S/L hip abd 2x10 3# each STS c  x10 5# Green band rows and ext x15 each Lateral paloff side steps RTB x5 5' each Therapeutic Activity: Dynamic balance - high steps, heel to toe; braiding SL standing Tandem standing s and c head nods and tiurning  [facility] Adult PT Treatment:                                                DATE: 10/20/22 Therapeutic Exercise: Standing heel/toe raises SLS trials  Tandem trials Tandem on  foam Green band rows and ext Green band supine and S/L clam reviewed    Qped hip ext - vs using bolster for UE vs forearms on counter  SKTC   Therapeutic Activity: Log roll for mat transfers to reduce back pain    [facility] Adult PT Treatment:                                                DATE: 10/13/22 Therapeutic Exercise: Review of HEP SLS trials 9 sec best Tandem > 30 each  Side clam green band +HEP PPT and Bridge discontinued for now  Supine March vs SLR (with optional UE reach to shin for ab contraction) +HEP Single knee to chest     [facility] Adult PT Treatment:                                                DATE: 09/27/22 Therapeutic Exercise: Hooklying Single Knee to Chest  2 reps - 20 hold Heel Toe Raises with Counter Support  2 sets 10 reps - 2 hold Standing Single Leg Stance with Counter Support  3 reps - 20 hold Sit to Stand Without Arm Support  2 sets - 10 reps Shoulder extension with resistance 2 sets - 15 reps Standing Bicep Curls with Resistance  2 sets  -15 reps Standing hip abd 2x10 Bridging x10 5' H/L clams x10 5" PPT 2x10 Abdominal bracing x30 10" Updated HEP                                                                                                                              [facility] Adult PT Treatment:                                                DATE: 09/21/22 Therapeutic Exercise: Developed,  instructed in, and pt completed therex as noted in HEP Self Care: Use of pillow between knees when sleeping in SL to minimize LBP     PATIENT EDUCATION:  Education details: Eval findings, POC, HEP, self care  Person educated: Patient Education method: Explanation, Demonstration, Tactile cues, Verbal cues, and Handouts Education comprehension: verbalized understanding, returned demonstration, verbal cues required, and tactile cues required   HOME EXERCISE PROGRAM: Access Code: [email or web address] URL: [email or web address] Date: 09/27/2022 Prepared by:  Joellyn Rued  Exercises - Hooklying Single Knee to Chest  - 1-2 x daily - 7 x weekly - 1 sets - 2 reps - 20 hold - Heel Toe Raises with Counter Support  - 1 x daily - 7 x weekly - 1-2 sets - 10-15 reps - 2 hold - Standing Single Leg Stance with Counter Support  - 1 x daily - 7 x weekly - 1-2 sets - 3-5 reps - 20 hold - Sit to Stand Without Arm Support  - 1 x daily - 7 x weekly - 1-2 sets - 10-15 reps - Shoulder extension with resistance - Neutral  - 1 x daily - 7 x weekly - 1-2 sets - 10-15 reps - Standing Bicep Curls with Resistance  - 1 x daily - 7 x weekly - 1-2 sets - 10-15 reps - Supine Bridge  - 1 x daily - 7 x weekly - 1-2 sets - 10 reps - 5 hold - Hooklying Clamshell with Resistance  - 1 x daily - 7 x weekly - 1-2 sets - 10 reps - 5 hold - Supine Posterior Pelvic Tilt  - 1 x daily - 7 x weekly - 12- sets - 10 reps Added 10/13/22 - Active straight leg raise  - 1 x daily - 7 x weekly - 1-2 sets - 10 reps - Clamshell with Resistance  - 1 x daily - 7 x weekly - 1-2 sets - 10-15 reps - slow hold  ASSESSMENT:   CLINICAL IMPRESSION: PT was completed for trunk and lower body strengthening therex and balance activities. Pt's balance is challenged with SL activities, but pt does note feeling more confident with her walking balance. Pt tolerated PT today without adverse effects. Pt will continue to benefit from skilled PT to address impairments for improved function. Will reassess hip strength and SL balance time the next PT session.    OBJECTIVE IMPAIRMENTS: decreased activity tolerance, decreased balance, decreased strength, and pain.    ACTIVITY LIMITATIONS: carrying, lifting, bending, and sitting   PARTICIPATION LIMITATIONS: cleaning, laundry, and shopping   PERSONAL FACTORS: Age, Past/current experiences, and Time since onset of injury/illness/exacerbation are also affecting patient's functional outcome.    REHAB POTENTIAL: Good   CLINICAL DECISION MAKING: Stable/uncomplicated    EVALUATION COMPLEXITY: Low     GOALS:   SHORT TERM GOALS=LTGs   LONG TERM GOALS: Target date: 11/09/22   Pt will be Ind in a final HEP to maintain achieved LOF  Baseline: initiated Goal status: ONGOING   2.  Pt will report a pain range of 0-4/10 for improved QOL Baseline: 0-9/10 10/13/22: can get bad if sits too long Goal status: ONGOING   3.  Pt will demonstrate an increase SLS to 10 sec or greater for improved balance Baseline: see functional tests 10/13/22: 9 sec best 10/20/22: 11 sec on left Goal status: Partially met    4.  Increase blat hip strength to 4+/5 for improved function and balance Baseline: See flow sheets Goal status: ONGOING    PLAN:  PT FREQUENCY: 1x/week   PT DURATION: 6 weeks   PLANNED INTERVENTIONS: Therapeutic exercises, Therapeutic activity, Balance training, Gait training, Patient/Family education, Self Care, Joint mobilization, Dry Needling, Electrical stimulation, Cryotherapy, Moist heat, Taping, Ultrasound, Ionotophoresis /ml Dexamethasone, Manual therapy, and Re-evaluation   PLAN FOR NEXT SESSION: assess response to HEP and progress to final HEP;  FOTO and DC when appropriate   [employer] MS, PT 10/28/22 1:39 PM

## 2022-10-28 ENCOUNTER — Ambulatory Visit: Payer: Medicare Other

## 2022-10-28 DIAGNOSIS — M5441 Lumbago with sciatica, right side: Secondary | ICD-10-CM

## 2022-10-28 DIAGNOSIS — M6281 Muscle weakness (generalized): Secondary | ICD-10-CM | POA: Diagnosis not present

## 2022-11-01 NOTE — Therapy (Signed)
OUTPATIENT PHYSICAL THERAPY TREATMENT NOTE   Patient Name: Christina Henderson MRN: [id number] DOB:[birth year], 87 y.o., female Today's Date: 11/03/2022  PCP: Gweneth Dimitri, MD  REFERRING PROVIDER: Talmage Coin, MD   END OF SESSION:   PT End of Session - 11/03/22 1017     Visit Number 6    Number of Visits 7    Date for PT Re-Evaluation 11/09/22    Authorization Type MEDICARE PART A AND B;  [employer]    PT Start Time 1016    PT Stop Time 1058    PT Time Calculation (min) 42 min    Activity Tolerance Patient tolerated treatment well    Behavior During Therapy WFL for tasks assessed/performed             Past Medical History:  Diagnosis Date   Arthritis    rhuematoid arthritis   Breast cancer 2018   Right Breast Cancer   Cancer 03/2017   right breast cancer   Complication of anesthesia    hard to wake up   GERD (gastroesophageal reflux disease)    food related   Glaucoma    Hypertension    Osteoporosis    osteopenia   Rosacea    Past Surgical History:  Procedure Laterality Date   APPENDECTOMY     BACK SURGERY     BREAST BIOPSY     BREAST LUMPECTOMY Right 04/2017   BREAST LUMPECTOMY WITH RADIOACTIVE SEED AND SENTINEL LYMPH NODE BIOPSY Bilateral 05/09/2017   Procedure: BILATERAL RADIOACTIVE SEED GUIDED LUMPECTOMIES WITH RIGHT SENTINEL LYMPH NODE BIOPSY;  Surgeon: Harriette Bouillon, MD;  Location: [facility];  Service: General;  Laterality: Bilateral;   EYE SURGERY     cataract   SPINE SURGERY     lumbar lamenectomy   TONSILLECTOMY     Patient Active Problem List   Diagnosis Date Noted   Bradycardia 08/15/2022   Dyslipidemia 11/25/2019   Educated about COVID-19 virus infection 11/25/2019   Chest pain 07/17/2018   Cough 07/17/2018   Essential hypertension 07/17/2018   Malignant neoplasm of upper-outer quadrant of right breast in female, estrogen receptor positive 05/23/2017    REFERRING DIAG: M81.0 (ICD-10-CM) -  Age-related osteoporosis without current pathological fracture   THERAPY DIAG:  Acute bilateral low back pain with right-sided sciatica  Muscle weakness (generalized)  Rationale for Evaluation and Treatment Rehabilitation                                                                                                                                                                                   ONSET DATE: 3 weeks   SUBJECTIVE:    SUBJECTIVE STATEMENT:  Pt reports she mostly experiences low back pain when she first wakes up, but it resolves when she starts to move around.   PERTINENT HISTORY: 1974 back surgery   PAIN:  Are you having pain? pain range Yes: NPRS scale: 0/10 Pain location: R low back and leg Pain description: ache, sharp  Aggravating factors: prolonged positions Relieving factors: changing positions, arthritis strength tylenol   PRECAUTIONS: None   WEIGHT BEARING RESTRICTIONS: No   FALLS:  Has patient fallen in last 6 months? No   LIVING ENVIRONMENT: Lives with: lives alone Lives in: House/apartment     OCCUPATION: Retired   PLOF: Independent   PATIENT GOALS: To be stronger and be steadier on her feet     OBJECTIVE: (objective measures completed at initial evaluation unless otherwise dated)   08/31/22 DIAGNOSTIC FINDINGS:  IMPRESSION: 1. Stable degenerative lumbar spondylosis. Advanced degenerative disc disease and facet disease. 2. No acute bony findings.   PATIENT SURVEYS:  FOTO: Perceived function   74%, predicted   82%    COGNITION: Overall cognitive status: Within functional limits for tasks assessed                         SENSATION: WFL   EDEMA:  None palpated   MUSCLE LENGTH: Hamstrings: Right WNLs deg; Left WNLs deg Maisie Fus test: Right NT deg; Left NT deg   POSTURE: rounded shoulders, forward head, decreased lumbar lordosis, and decreased thoracic kyphosis   PALPATION: TTP lumbar paraspinals   LUMBAR ROM:    Active   A/PROM  09/21/2022  Flexion Full   Extension Min limitation  Right lateral flexion Min limitation  Left lateral flexion Min limitation  Right rotation Full  Left rotation Full   (Blank rows = not tested)   LOWER EXTREMITY MMT:   MMT Right eval Left eval  Hip flexion 4 4  Hip extension 3+ 3+  Hip abduction 4 4  Hip adduction 4+ 4+  Hip internal rotation 4+ 4+  Hip external rotation 4 4  Knee flexion 4+ 4+  Knee extension 4+ 4+  Ankle dorsiflexion 4+ 4+  Ankle plantarflexion 4+ 4+  Ankle inversion      Ankle eversion       (Blank rows = not tested)   LOWER EXTREMITY ROM:   Active ROM Right eval Left eval  Hip flexion      Hip extension      Hip abduction      Hip adduction      Hip internal rotation      Hip external rotation      Knee flexion      Knee extension      Ankle dorsiflexion      Ankle plantarflexion      Ankle inversion      Ankle eversion       (Blank rows = not tested)   LOWER EXTREMITY SPECIAL TESTS:  NA   FUNCTIONAL TESTS:  5 times sit to stand: 10.4 sec s use of hands            SLS: L=4,6 sec- 5 sec; R= 2, 5 sec- 3sec  10/13/22: SLS L & R = 9 sec best bilateral   10/20/22: 11 sec best on left GAIT: Distance walked: 259ft Assistive device utilized: None Level of assistance: Complete Independence Comments: WNLs     TODAY'S TREATMENT:  [facility] Adult PT Treatment:  DATE: 11/03/22 Therapeutic Exercise: Standing hip ext 2x10 3# each  Standing hip abd 2x10 3# each Therapeutic Activity: Dynamic balance - high steps, heel to toe; braiding SL standing- consistently 8-10 sec Lateral step up 5' to airex 2x10 each Banded side step at feet in // bars 4x15' Tandem standing s and c head nods and tiurning   [facility] Adult PT Treatment:                                                DATE: 10/1922 Therapeutic Exercise: S/L hip clams 2x10 GTB each  S/L hip abd 2x10 3# each STS c  x10 5# Green band rows and  ext x15 each Lateral paloff side steps RTB x5 5' each Therapeutic Activity: Dynamic balance - high steps, heel to toe; braiding SL standing Tandem standing s and c head nods and tiurning  [facility] Adult PT Treatment:                                                DATE: 10/20/22 Therapeutic Exercise: Standing heel/toe raises SLS trials  Tandem trials Tandem on foam Green band rows and ext Green band supine and S/L clam reviewed    Qped hip ext - vs using bolster for UE vs forearms on counter  SKTC   Therapeutic Activity: Log roll for mat transfers to reduce back pain    [facility] Adult PT Treatment:                                                DATE: 10/13/22 Therapeutic Exercise: Review of HEP SLS trials 9 sec best Tandem > 30 each  Side clam green band +HEP PPT and Bridge discontinued for now  Supine March vs SLR (with optional UE reach to shin for ab contraction) +HEP Single knee to chest         PATIENT EDUCATION:  Education details: Eval findings, POC, HEP, self care  Person educated: Patient Education method: Explanation, Demonstration, Tactile cues, Verbal cues, and Handouts Education comprehension: verbalized understanding, returned demonstration, verbal cues required, and tactile cues required   HOME EXERCISE PROGRAM: Access Code: [email or web address] URL: [email or web address] Date: 11/03/2022 Prepared by: Joellyn Rued  Exercises - Hooklying Single Knee to Chest  - 1-2 x daily - 7 x weekly - 1 sets - 2 reps - 20 hold - Heel Toe Raises with Counter Support  - 1 x daily - 7 x weekly - 1-2 sets - 10-15 reps - 2 hold - Standing Single Leg Stance with Counter Support  - 1 x daily - 7 x weekly - 1-2 sets - 3-5 reps - 20 hold - Sit to Stand Without Arm Support  - 1 x daily - 7 x weekly - 1-2 sets - 10-15 reps - Shoulder extension with resistance - Neutral  - 1 x daily - 7 x weekly - 1-2 sets - 10-15 reps - Standing Bicep Curls with Resistance  - 1 x daily - 7 x weekly -  1-2 sets - 10-15 reps - Supine Bridge  - 1 x daily - 7 x weekly -  1-2 sets - 10 reps - 5 hold - Hooklying Clamshell with Resistance  - 1 x daily - 7 x weekly - 1-2 sets - 10 reps - 5 hold - Supine Posterior Pelvic Tilt  - 1 x daily - 7 x weekly - 12- sets - 10 reps - Active straight leg raise  - 1 x daily - 7 x weekly - 1-2 sets - 10 reps - Clamshell with Resistance  - 1 x daily - 7 x weekly - 1-2 sets - 10-15 reps - slow hold - Standing Bilateral Low Shoulder Row with Anchored Resistance  - 1 x daily - 7 x weekly - 2 sets - 10 reps - Prone Hip Extension on Table  - 1 x daily - 7 x weekly - 2 sets - 10 reps - Standing Tandem Balance with Counter Support  - 1 x daily - 7 x weekly - 1 sets - 3 reps - 20 hold - Standing Single Leg Stance with Counter Support  - 1 x daily - 7 x weekly - 1 sets - 3 reps - 20 hold  ASSESSMENT:   CLINICAL IMPRESSION: PT was completed for lower body strengthening and balance. Pt is consistently demonstrating improved SL balance vs the eval. Pt tolerated PT today without adverse effects. Pt has made appropriate progress to date with PT. Will reassess the next PT session for continuation of services vs Dc to a HEP.  OBJECTIVE IMPAIRMENTS: decreased activity tolerance, decreased balance, decreased strength, and pain.    ACTIVITY LIMITATIONS: carrying, lifting, bending, and sitting   PARTICIPATION LIMITATIONS: cleaning, laundry, and shopping   PERSONAL FACTORS: Age, Past/current experiences, and Time since onset of injury/illness/exacerbation are also affecting patient's functional outcome.    REHAB POTENTIAL: Good   CLINICAL DECISION MAKING: Stable/uncomplicated   EVALUATION COMPLEXITY: Low     GOALS:   SHORT TERM GOALS=LTGs   LONG TERM GOALS: Target date: 11/09/22   Pt will be Ind in a final HEP to maintain achieved LOF  Baseline: initiated Goal status: ONGOING   2.  Pt will report a pain range of 0-4/10 for improved QOL Baseline: 0-9/10 10/13/22: can  get bad if sits too long Goal status: ONGOING   3.  Pt will demonstrate an increase SLS to 10 sec or greater for improved balance Baseline: see functional tests 10/13/22: 9 sec best 10/20/22: 11 sec on left Goal status: Partially met    4.  Increase blat hip strength to 4+/5 for improved function and balance Baseline: See flow sheets Goal status: ONGOING    PLAN:   PT FREQUENCY: 1x/week   PT DURATION: 6 weeks   PLANNED INTERVENTIONS: Therapeutic exercises, Therapeutic activity, Balance training, Gait training, Patient/Family education, Self Care, Joint mobilization, Dry Needling, Electrical stimulation, Cryotherapy, Moist heat, Taping, Ultrasound, Ionotophoresis /ml Dexamethasone, Manual therapy, and Re-evaluation   PLAN FOR NEXT SESSION: assess response to HEP and progress to final HEP;  FOTO and DC when appropriate   [employer] MS, PT 11/03/22 11:15 AM

## 2022-11-03 ENCOUNTER — Ambulatory Visit: Payer: Medicare Other

## 2022-11-03 DIAGNOSIS — M5441 Lumbago with sciatica, right side: Secondary | ICD-10-CM

## 2022-11-03 DIAGNOSIS — M6281 Muscle weakness (generalized): Secondary | ICD-10-CM | POA: Diagnosis not present

## 2022-11-03 DIAGNOSIS — Z23 Encounter for immunization: Secondary | ICD-10-CM | POA: Diagnosis not present

## 2022-11-07 DIAGNOSIS — I10 Essential (primary) hypertension: Secondary | ICD-10-CM | POA: Diagnosis not present

## 2022-11-07 DIAGNOSIS — L821 Other seborrheic keratosis: Secondary | ICD-10-CM | POA: Diagnosis not present

## 2022-11-07 DIAGNOSIS — M069 Rheumatoid arthritis, unspecified: Secondary | ICD-10-CM | POA: Diagnosis not present

## 2022-11-07 DIAGNOSIS — M1990 Unspecified osteoarthritis, unspecified site: Secondary | ICD-10-CM | POA: Diagnosis not present

## 2022-11-07 DIAGNOSIS — G479 Sleep disorder, unspecified: Secondary | ICD-10-CM | POA: Diagnosis not present

## 2022-11-07 DIAGNOSIS — Z79899 Other long term (current) drug therapy: Secondary | ICD-10-CM | POA: Diagnosis not present

## 2022-11-07 DIAGNOSIS — D0511 Intraductal carcinoma in situ of right breast: Secondary | ICD-10-CM | POA: Diagnosis not present

## 2022-11-07 DIAGNOSIS — L404 Guttate psoriasis: Secondary | ICD-10-CM | POA: Diagnosis not present

## 2022-11-07 DIAGNOSIS — Z23 Encounter for immunization: Secondary | ICD-10-CM | POA: Diagnosis not present

## 2022-11-07 DIAGNOSIS — E785 Hyperlipidemia, unspecified: Secondary | ICD-10-CM | POA: Diagnosis not present

## 2022-11-07 DIAGNOSIS — M810 Age-related osteoporosis without current pathological fracture: Secondary | ICD-10-CM | POA: Diagnosis not present

## 2022-11-07 DIAGNOSIS — Z6825 Body mass index (BMI) 25.0-25.9, adult: Secondary | ICD-10-CM | POA: Diagnosis not present

## 2022-11-10 ENCOUNTER — Ambulatory Visit: Payer: Medicare Other | Attending: Family Medicine

## 2022-11-10 DIAGNOSIS — M5441 Lumbago with sciatica, right side: Secondary | ICD-10-CM | POA: Diagnosis not present

## 2022-11-10 DIAGNOSIS — M6281 Muscle weakness (generalized): Secondary | ICD-10-CM | POA: Insufficient documentation

## 2022-11-10 DIAGNOSIS — M810 Age-related osteoporosis without current pathological fracture: Secondary | ICD-10-CM | POA: Insufficient documentation

## 2022-11-10 NOTE — Addendum Note (Signed)
Addended by: Joellyn Rued on: 11/10/2022 01:01 PM   Modules accepted: Orders

## 2022-11-10 NOTE — Therapy (Addendum)
OUTPATIENT PHYSICAL THERAPY TREATMENT NOTE/Re-Cert/Discharge   Patient Name: Christina Henderson MRN: [id number] DOB:[birth year], 87 y.o., female Today's Date: 11/10/2022  PCP: Gweneth Dimitri, MD  REFERRING PROVIDER: Talmage Coin, MD   END OF SESSION:   PT End of Session - 11/10/22 1026     Visit Number 7    Number of Visits 7    Date for PT Re-Evaluation 11/10/22    Authorization Type MEDICARE PART A AND B;  [employer]    PT Start Time 1024    PT Stop Time 1107    PT Time Calculation (min) 43 min    Activity Tolerance Patient tolerated treatment well    Behavior During Therapy WFL for tasks assessed/performed              Past Medical History:  Diagnosis Date   Arthritis    rhuematoid arthritis   Breast cancer (HCC) 2018   Right Breast Cancer   Cancer (HCC) 03/2017   right breast cancer   Complication of anesthesia    hard to wake up   GERD (gastroesophageal reflux disease)    food related   Glaucoma    Hypertension    Osteoporosis    osteopenia   Rosacea    Past Surgical History:  Procedure Laterality Date   APPENDECTOMY     BACK SURGERY     BREAST BIOPSY     BREAST LUMPECTOMY Right 04/2017   BREAST LUMPECTOMY WITH RADIOACTIVE SEED AND SENTINEL LYMPH NODE BIOPSY Bilateral 05/09/2017   Procedure: BILATERAL RADIOACTIVE SEED GUIDED LUMPECTOMIES WITH RIGHT SENTINEL LYMPH NODE BIOPSY;  Surgeon: Harriette Bouillon, MD;  Location: [facility];  Service: General;  Laterality: Bilateral;   EYE SURGERY     cataract   SPINE SURGERY     lumbar lamenectomy   TONSILLECTOMY     Patient Active Problem List   Diagnosis Date Noted   Bradycardia 08/15/2022   Dyslipidemia 11/25/2019   Educated about COVID-19 virus infection 11/25/2019   Chest pain 07/17/2018   Cough 07/17/2018   Essential hypertension 07/17/2018   Malignant neoplasm of upper-outer quadrant of right breast in female, estrogen receptor positive (HCC) 05/23/2017     REFERRING DIAG: M81.0 (ICD-10-CM) - Age-related osteoporosis without current pathological fracture   THERAPY DIAG:  Acute bilateral low back pain with right-sided sciatica - Plan: PT plan of care cert/re-cert  Muscle weakness (generalized) - Plan: PT plan of care cert/re-cert  Rationale for Evaluation and Treatment Rehabilitation  ONSET DATE: 3 weeks   SUBJECTIVE:    SUBJECTIVE STATEMENT: Pt reports being pleased with her progress.   PERTINENT HISTORY: 1974 back surgery   PAIN:  Are you having pain? pain range Yes: NPRS scale: 0/10 range 0-3/10 Pain location: R low back and leg Pain description: ache, sharp  Aggravating factors: prolonged positions Relieving factors: changing positions, arthritis strength tylenol   PRECAUTIONS: None   WEIGHT BEARING RESTRICTIONS: No   FALLS:  Has patient fallen in last 6 months? No   LIVING ENVIRONMENT: Lives with: lives alone Lives in: House/apartment     OCCUPATION: Retired   PLOF: Independent   PATIENT GOALS: To be stronger and be steadier on her feet     OBJECTIVE: (objective measures completed at initial evaluation unless otherwise dated)   08/31/22 DIAGNOSTIC FINDINGS:  IMPRESSION: 1. Stable degenerative lumbar spondylosis. Advanced degenerative disc disease and facet disease. 2. No acute bony findings.   PATIENT SURVEYS:  FOTO: Perceived function   74%, predicted   82%    COGNITION: Overall cognitive status: Within functional limits for tasks assessed                         SENSATION: WFL   EDEMA:  None palpated   MUSCLE LENGTH: Hamstrings: Right WNLs deg; Left WNLs deg Maisie Fus test: Right NT deg; Left NT deg   POSTURE: rounded shoulders, forward head, decreased lumbar lordosis, and decreased thoracic kyphosis    PALPATION: TTP lumbar paraspinals   LUMBAR ROM:    Active  A/PROM  09/21/2022  Flexion Full   Extension Min limitation  Right lateral flexion Min limitation  Left lateral flexion Min limitation  Right rotation Full  Left rotation Full   (Blank rows = not tested)   LOWER EXTREMITY MMT:   MMT Right eval Left eval RT 11/10/22 LT 11/10/22  Hip flexion 4 4 4+ 4+  Hip extension 3+ 3+ 4 4  Hip abduction 4 4 4 4   Hip adduction 4+ 4+ 5 5  Hip internal rotation 4+ 4+ 5 5  Hip external rotation 4 4 4+ 4+  Knee flexion 4+ 4+ 5 5  Knee extension 4+ 4+ 5 4  Ankle dorsiflexion 4+ 4+ 4+ 4+  Ankle plantarflexion 4+ 4+ 4+ 4+  Ankle inversion        Ankle eversion         (Blank rows = not tested)   LOWER EXTREMITY ROM:   Active ROM Right eval Left eval  Hip flexion      Hip extension      Hip abduction      Hip adduction      Hip internal rotation      Hip external rotation      Knee flexion      Knee extension      Ankle dorsiflexion      Ankle plantarflexion      Ankle inversion      Ankle eversion       (Blank rows = not tested)   LOWER EXTREMITY SPECIAL TESTS:  NA   FUNCTIONAL TESTS:  5 times sit to stand: 10.4 sec s use of hands            SLS: L=4,6 sec- 5 sec; R= 2, 5 sec- 3sec  10/13/22: SLS L & R = 9 sec best bilateral   10/20/22: 11 sec best on left GAIT: Distance walked: 262ft Assistive device utilized: None Level of assistance:  Complete Independence Comments: WNLs     TODAY'S TREATMENT:  [facility] Adult PT Treatment:                                                DATE: 11/10/22 Therapeutic Exercise: MMT legs Gastroc stretch x2 20" S/L hip abd x10 Standing hip ext 2x10 RTG Standing hip abd 2x10 RTG Updated HEP Therapeutic Activity: Braiding SL standing 12' each FOTO   [facility] Adult PT Treatment:                                                DATE: 11/03/22 Therapeutic Exercise: Standing hip ext 2x10 3# each  Standing hip abd 2x10 3# each Therapeutic  Activity: Dynamic balance - high steps, heel to toe; braiding SL standing- consistently 8-10 sec Lateral step up 5' to airex 2x10 each Banded side step at feet in // bars 4x15' Tandem standing s and c head nods and tiurning   [facility] Adult PT Treatment:                                                DATE: 10/1922 Therapeutic Exercise: S/L hip clams 2x10 GTB each  S/L hip abd 2x10 3# each STS c  x10 5# Green band rows and ext x15 each Lateral paloff side steps RTB x5 5' each Therapeutic Activity: Dynamic balance - high steps, heel to toe; braiding SL standing Tandem standing s and c head nods and tiurning    PATIENT EDUCATION:  Education details: Eval findings, POC, HEP, self care  Person educated: Patient Education method: Explanation, Demonstration, Tactile cues, Verbal cues, and Handouts Education comprehension: verbalized understanding, returned demonstration, verbal cues required, and tactile cues required   HOME EXERCISE PROGRAM: Access Code: [email or web address]  ASSESSMENT:   CLINICAL IMPRESSION: Pt completed her last day of PT today. Pt has made very good progress regarding pain, strength, and balance. All PT goals were improved or met, and pt's FOTO score exceeded the predicted value. Pt is Ind in a HEP to maintain or progress current LOF. Pt is in agreement with DC from PT at this time.  OBJECTIVE IMPAIRMENTS: decreased activity tolerance, decreased balance, decreased strength, and pain.    ACTIVITY LIMITATIONS: carrying, lifting, bending, and sitting   PARTICIPATION LIMITATIONS: cleaning, laundry, and shopping   PERSONAL FACTORS: Age, Past/current experiences, and Time since onset of injury/illness/exacerbation are also affecting patient's functional outcome.    REHAB POTENTIAL: Good   CLINICAL DECISION MAKING: Stable/uncomplicated   EVALUATION COMPLEXITY: Low     GOALS:   SHORT TERM GOALS=LTGs   LONG TERM GOALS: Target date: 11/09/22   Pt will be Ind in a final HEP  to maintain achieved LOF  Baseline: initiated Goal status: MET   2.  Pt will report a pain range of 0-4/10 for improved QOL Baseline: 0-9/10 10/13/22: can get bad if sits too long Status:11/10/22: 0-3/10 Goal status: MET   3.  Pt will demonstrate an increase SLS to 10 sec or greater for improved balance Baseline: see functional tests 10/13/22: 9 sec best 10/20/22: 11 sec on left 11/10/22:  12" each Goal status: MET   4.  Increase blat hip strength to 4+/5 for improved function and balance Baseline: See flow sheets Status: 11/10/22: see flow sheets improved for majority hip muscle groups Goal status: Improved-Partially MET PLAN:   PT FREQUENCY: 1x/week   PT DURATION: 6 weeks   PLANNED INTERVENTIONS: Therapeutic exercises, Therapeutic activity, Balance training, Gait training, Patient/Family education, Self Care, Joint mobilization, Dry Needling, Electrical stimulation, Cryotherapy, Moist heat, Taping, Ultrasound, Ionotophoresis 4mg /ml Dexamethasone, Manual therapy, and Re-evaluation   PLAN FOR NEXT SESSION: assess response to HEP and progress to final HEP;  FOTO and DC when appropriate  PHYSICAL THERAPY DISCHARGE SUMMARY  Visits from Start of Care: 7  Current functional level related to goals / functional outcomes: See clinical impression and PT goals    Remaining deficits: See clinical impression and PT goals    Education / Equipment: HEP   Patient agrees to discharge. Patient goals were  All goals improved or MET . Patient is being discharged due to being pleased with the current functional level.    Trevor Duty MS, PT 11/10/22 1:00 PM

## 2022-11-15 NOTE — Progress Notes (Unsigned)
  a Cardiology Office Note:   Date:  11/17/2022  ID:  Christina Henderson, DOB June 08, 1936, MRN [id number]  History of Present Illness:   Christina Henderson is a 87 y.o. female who presents for follow up of chest pain.   She had a negative POET (Plain Old Exercise Treadmill) in 2019.  Since I last saw her I had started her on olmesartan.  However, she had a metallic taste in her mouth so she switched to candesartan.  She just started this.  Her blood pressure diary which is fairly complete suggested she is typically in the 150s systolic with diastolics are well-controlled.  He runs a little bit lower limit higher at times.  She still swimming for exercise.  She says she feels well doing this but other activities might make her slightly short of breath.  She might notice this with just doing chores around her home.  She is not having any chest pressure, neck or arm discomfort.  She is not having any palpitations, presyncope or syncope.  She has had no weight gain or edema.  ROS: As stated in the HPI and negative for all other systems.   Studies Reviewed:    EKG:  NA   Risk Assessment/Calculations:              Physical Exam:   VS:  BP (!) 118/50 (BP Location: Left Arm, Patient Position: Sitting, Cuff Size: Normal)   Pulse (!) 58   Ht 5\' 1"  (1.549 m)   Wt 139 lb (63 kg)   SpO2 94%   BMI 26.26 kg/m    Wt Readings from Last 3 Encounters:  11/17/22 139 lb (63 kg)  08/17/22 142 lb 12.8 oz (64.8 kg)  05/27/22 142 lb (64.4 kg)     GEN: Well nourished, well developed in no acute distress NECK: No JVD; No carotid bruits CARDIAC: RRR, very soft apical early peaking systolic murmur nonradiating, no diastolic murmurs, rubs, gallops RESPIRATORY:  Clear to auscultation without rales, wheezing or rhonchi  ABDOMEN: Soft, non-tender, non-distended EXTREMITIES:  No edema; No deformity   ASSESSMENT AND PLAN:   HTN:   Her blood pressure is not quite at target but she just started candesartan.   She is going to keep a blood pressure diary for little bit longer and send that to me.  If she has still blood pressure is not at target I might add a low-dose of spironolactone.   Shortness of breath: I will check a BNP.  If she continues to have progressive shortness of breath I will have a low threshold for echocardiography given the slight murmur.  Signed, Rollene Rotunda, MD

## 2022-11-17 ENCOUNTER — Ambulatory Visit: Payer: Medicare Other | Attending: Cardiology | Admitting: Cardiology

## 2022-11-17 ENCOUNTER — Encounter: Payer: Self-pay | Admitting: Cardiology

## 2022-11-17 VITALS — BP 118/50 | HR 58 | Ht 61.0 in | Wt 139.0 lb

## 2022-11-17 DIAGNOSIS — E785 Hyperlipidemia, unspecified: Secondary | ICD-10-CM | POA: Diagnosis not present

## 2022-11-17 DIAGNOSIS — R001 Bradycardia, unspecified: Secondary | ICD-10-CM

## 2022-11-17 DIAGNOSIS — R0602 Shortness of breath: Secondary | ICD-10-CM | POA: Diagnosis not present

## 2022-11-17 DIAGNOSIS — I10 Essential (primary) hypertension: Secondary | ICD-10-CM

## 2022-11-17 NOTE — Patient Instructions (Signed)
Medication Instructions:  Your physician recommends that you continue on your current medications as directed. Please refer to the Current Medication list given to you today.  *If you need a refill on your cardiac medications before your next appointment, please call your pharmacy*   Lab Work: Your physician recommends that you have labs drawn today: BNP   If you have labs (blood work) drawn today and your tests are completely normal, you will receive your results only by: MyChart Message (if you have MyChart) OR A paper copy in the mail If you have any lab test that is abnormal or we need to change your treatment, we will call you to review the results.   Follow-Up: At [facility], you and your health needs are our priority.  As part of our continuing mission to provide you with exceptional heart care, we have created designated Provider Care Teams.  These Care Teams include your primary Cardiologist (physician) and Advanced Practice Providers (APPs -  Physician Assistants and Nurse Practitioners) who all work together to provide you with the care you need, when you need it.  We recommend signing up for the patient portal called "MyChart".  Sign up information is provided on this After Visit Summary.  MyChart is used to connect with patients for Virtual Visits (Telemedicine).  Patients are able to view lab/test results, encounter notes, upcoming appointments, etc.  Non-urgent messages can be sent to your provider as well.   To learn more about what you can do with MyChart, go to [email or web address].    Your next appointment:   3 month(s)  Provider:   Rollene Rotunda, MD

## 2022-11-18 LAB — BRAIN NATRIURETIC PEPTIDE: BNP: 54 pg/mL (ref 0.0–100.0)

## 2022-11-19 ENCOUNTER — Encounter: Payer: Self-pay | Admitting: Cardiology

## 2022-11-22 MED ORDER — HYDRALAZINE HCL 10 MG PO TABS: ORAL_TABLET | ORAL | 2 refills | Status: DC

## 2022-11-22 NOTE — Telephone Encounter (Signed)
Please verify dose so new RX can be sent please

## 2022-11-24 ENCOUNTER — Encounter: Payer: Self-pay | Admitting: *Deleted

## 2022-12-07 ENCOUNTER — Encounter: Payer: Self-pay | Admitting: Pharmacist Clinician (PhC)/ Clinical Pharmacy Specialist

## 2022-12-07 ENCOUNTER — Ambulatory Visit: Payer: Medicare Other | Attending: Cardiology | Admitting: Pharmacist Clinician (PhC)/ Clinical Pharmacy Specialist

## 2022-12-07 VITALS — BP 183/77 | HR 52

## 2022-12-07 DIAGNOSIS — I10 Essential (primary) hypertension: Secondary | ICD-10-CM | POA: Insufficient documentation

## 2022-12-07 MED ORDER — SPIRONOLACTONE 25 MG PO TABS: 25.0000 mg | ORAL_TABLET | Freq: Every day | ORAL | 3 refills | Status: DC

## 2022-12-07 NOTE — Assessment & Plan Note (Signed)
Assessment: BP is uncontrolled in office BP 169/70 mmHg;  above the goal (<130/80). No issues with medication compliance, (even with 11 hydralazine tabs/day) Tolerates current medications well without any side effects Denies SOB, palpitation, chest pain, headaches,or swelling Praised low sodium diet and daily exercise  Plan:  Start taking spironolactone 25 mg once daily Continue taking all other medications Patient to keep record of BP readings with heart rate and report to [place] at the next visit Patient to follow up with Belenda Cruise in 1 month  Labs ordered today:  BMET in 2 weeks

## 2022-12-07 NOTE — Progress Notes (Signed)
Office Visit    Patient Name: Christina Henderson Date of Encounter: 12/07/2022  Primary Care Provider:  Gweneth Dimitri, MD Primary Cardiologist:  Rollene Rotunda, MD  Chief Complaint    Hypertension  Significant Past Medical History   hyperlipidemia 4/24 LDL 108 - on rosuvastatin 10  bradycardia HR stable in 50-60 range    Allergies  Allergen Reactions   Caffeine     Triggers rosacea   Statins Other (See Comments)    CAUSES MENTAL CHARGES, DYSLARIA   Sulfa Antibiotics Other (See Comments)    Not sure, childhood allergy    History of Present Illness    Christina Henderson is a 87 y.o. female patient of Dr Antoine Poche, in the office today for hypertension evaluation.   She was seen by Dr. Antoine Poche last month with good blood pressure control (118/50), but had just switched from olmesartan to candesartan in the previous week.  While she was 118/50 in the office, her home readings were more in the 150 systolic range.  She was asked to monitor home readings twice daily and return for follow up.   Today she returns to the office.  She notes she was first diagnosed with hypertension about 20 years ago.  She was most recently on olmesartan, but it caused her to have a metallic taste, so it was switched to candesartan.  She notes the taste is still there, but not nearly as strong and she is fine with the current medication.    Blood Pressure Goal:  130/80  Current Medications:  candesartan 32 mg qd, amlodipine 10 mg qd, hydralazine 30/40/40 mg tid  Previously tried:  olmesartan - metallic taste  Family Hx: mother died CHF, also had RA; father had CAD (CABG, carotid endarterectomy - died at 62 from first MI); brother died COVID at 110; 3 kids (1 adopted), no health issues  Social Hx:      Tobacco: none - quit in 1970  Alcohol: none - triggers rosacea  Caffeine:  decaf coffee in the morning, no other caffeine (triggers rosacea)  Diet:  eats 1 meal in the dining hall most days -  lunch or dinner; breakfast is english muffin, egg white, cheese, [place] sausage; doesn't snack much, cheese is most common;  does not add salt, but does get some in dining hall foods  Exercise: swims 4-5 days per week, 40 minutes  ([facility] has pool)  Home BP readings:  22 AM readings average 139/65  HR 54  (range 96-166/50-81) 15 PM readings average 162/74  HR 60  (range 129-192/60-91)  Adherence Assessment  Do you ever forget to take your medication? [] Yes [x] No  Do you ever skip doses due to side effects? [] Yes [x] No  Do you have trouble affording your medicines? [] Yes [x] No  Are you ever unable to pick up your medication due to transportation difficulties? [] Yes [x] No  Do you ever stop taking your medications because you don't believe they are helping? [] Yes [x] No    Accessory Clinical Findings    Lab Results  Component Value Date   CREATININE 0.74 05/03/2017   BUN 24 (H) 05/03/2017   NA 140 05/03/2017   K 4.2 05/03/2017   CL 105 05/03/2017   CO2 28 05/03/2017   Lab Results  Component Value Date   ALT 25 05/03/2017   AST 24 05/03/2017   ALKPHOS 63 05/03/2017   BILITOT 1.0 05/03/2017   No results found for: "HGBA1C"  Home Medications    Current Outpatient Medications  Medication Sig Dispense Refill   amLODipine (NORVASC) 10 MG tablet Take 1 tablet (10 mg total) by mouth at bedtime. 90 tablet 3   anastrozole (ARIMIDEX) 1 MG tablet Take 1 tablet (1 mg total) by mouth daily. 90 tablet 3   candesartan (ATACAND) 32 MG tablet Take 32 mg by mouth daily.     fluocinonide cream (LIDEX) 0.05 % Apply 1 application topically 2 (two) times daily.     hydrALAZINE (APRESOLINE) 10 MG tablet Take 3 tablets in the morning and 4 tablets in the afternoon and evening 990 tablet 2   hydroxychloroquine (PLAQUENIL) 200 MG tablet Take 2 tablets (400 mg total) by mouth daily.     ibandronate (BONIVA) 150 MG tablet Take 1 tablet by mouth every 30 (thirty) days.     Multiple Vitamin  (MULTIVITAMIN WITH MINERALS) TABS tablet Take 1 tablet by mouth daily.     Probiotic Product (PROBIOTIC ACIDOPHILUS BIOBEADS) CAPS Take by mouth daily.      rosuvastatin (CRESTOR) 10 MG tablet Take 10 mg by mouth daily.     spironolactone (ALDACTONE) 25 MG tablet Take 1 tablet (25 mg total) by mouth daily. 90 tablet 3   No current facility-administered medications for this visit.     HYPERTENSION CONTROL Vitals:   12/07/22 0928 12/07/22 0951  BP: (!) 169/70 (!) 183/77    The patient's blood pressure is elevated above target today.  In order to address the patient's elevated BP: Blood pressure will be monitored at home to determine if medication changes need to be made.; A new medication was prescribed today.      Assessment & Plan    Essential hypertension Assessment: BP is uncontrolled in office BP 169/70 mmHg;  above the goal (<130/80). No issues with medication compliance, (even with 11 hydralazine tabs/day) Tolerates current medications well without any side effects Denies SOB, palpitation, chest pain, headaches,or swelling Praised low sodium diet and daily exercise  Plan:  Start taking spironolactone 25 mg once daily Continue taking all other medications Patient to keep record of BP readings with heart rate and report to [place] at the next visit Patient to follow up with Belenda Cruise in 1 month  Labs ordered today:  BMET in 2 weeks   Phillips Hay PharmD CPP [facility]  [address] [place], [place] [address] [phone]

## 2022-12-07 NOTE — Patient Instructions (Addendum)
Follow up appointment: Monday July 8 at 10:30 am  Go to the lab in 2 weeks - week of June 10  Take your BP meds as follows:  Start spironolactone 25 mg once daily  Check your blood pressure at home daily (if able) and keep record of the readings.  Hypertension "High blood pressure"  Hypertension is often called "The Silent Killer." It rarely causes symptoms until it is extremely  high or has done damage to other organs in the body. For this reason, you should have your  blood pressure checked regularly by your physician. We will check your blood pressure  every time you see a provider at one of our offices.   Your blood pressure reading consists of two numbers. Ideally, blood pressure should be  below 120/80. The first ("top") number is called the systolic pressure. It measures the  pressure in your arteries as your heart beats. The second ("bottom") number is called the diastolic pressure. It measures the pressure in your arteries as the heart relaxes between beats.  The benefits of getting your blood pressure under control are enormous. A 10-point  reduction in systolic blood pressure can reduce your risk of stroke by 27% and heart failure by 28%  Your blood pressure goal is < 130/80  To check your pressure at home you will need to:  1. Sit up in a chair, with feet flat on the floor and back supported. Do not cross your ankles or legs. 2. Rest your left arm so that the cuff is about heart level. If the cuff goes on your upper arm,  then just relax the arm on the table, arm of the chair or your lap. If you have a wrist cuff, we  suggest relaxing your wrist against your chest (think of it as Pledging the Flag with the  wrong arm).  3. Place the cuff snugly around your arm, about 1 inch above the crook of your elbow. The  cords should be inside the groove of your elbow.  4. Sit quietly, with the cuff in place, for about 5 minutes. After that 5 minutes press the power  button to  start a reading. 5. Do not talk or move while the reading is taking place.  6. Record your readings on a sheet of paper. Although most cuffs have a memory, it is often  easier to see a pattern developing when the numbers are all in front of you.  7. You can repeat the reading after 1-3 minutes if it is recommended  Make sure your bladder is empty and you have not had caffeine or tobacco within the last 30 min  Always bring your blood pressure log with you to your appointments. If you have not brought your monitor in to be double checked for accuracy, please bring it to your next appointment.  You can find a list of quality blood pressure cuffs at validatebp.org

## 2022-12-20 ENCOUNTER — Encounter: Payer: Self-pay | Admitting: Cardiology

## 2022-12-20 ENCOUNTER — Telehealth: Payer: Self-pay | Admitting: Cardiology

## 2022-12-20 MED ORDER — HYDRALAZINE HCL 10 MG PO TABS: ORAL_TABLET | ORAL | 2 refills | Status: DC

## 2022-12-20 NOTE — Telephone Encounter (Signed)
Patient is returning call based off of MyChart message. Transferred to [place], Charity fundraiser.

## 2022-12-20 NOTE — Telephone Encounter (Signed)
Spoke with pt, she already has a follow up visit with PharmD in the hypertension clinic scheduled for July 8. Pt will keep this appointment and continue to monitor her blood pressure. She will reach back out with any additional concerns.

## 2022-12-20 NOTE — Telephone Encounter (Signed)
Additional encounter opened. See mychart message for more information.

## 2022-12-23 DIAGNOSIS — I10 Essential (primary) hypertension: Secondary | ICD-10-CM | POA: Diagnosis not present

## 2022-12-24 LAB — BASIC METABOLIC PANEL
BUN/Creatinine Ratio: 23 (ref 12–28)
BUN: 19 mg/dL (ref 8–27)
CO2: 25 mmol/L (ref 20–29)
Calcium: 10.1 mg/dL (ref 8.7–10.3)
Chloride: 104 mmol/L (ref 96–106)
Creatinine, Ser: 0.82 mg/dL (ref 0.57–1.00)
Glucose: 88 mg/dL (ref 70–99)
Potassium: 4.9 mmol/L (ref 3.5–5.2)
Sodium: 140 mmol/L (ref 134–144)
eGFR: 70 mL/min/{1.73_m2} (ref >59)

## 2022-12-26 ENCOUNTER — Encounter: Payer: Self-pay | Admitting: Pharmacist Clinician (PhC)/ Clinical Pharmacy Specialist

## 2023-01-16 ENCOUNTER — Ambulatory Visit: Payer: Medicare Other | Attending: Cardiology | Admitting: Pharmacist Clinician (PhC)/ Clinical Pharmacy Specialist

## 2023-01-16 ENCOUNTER — Encounter: Payer: Self-pay | Admitting: Pharmacist Clinician (PhC)/ Clinical Pharmacy Specialist

## 2023-01-16 VITALS — BP 141/64 | HR 52

## 2023-01-16 DIAGNOSIS — I10 Essential (primary) hypertension: Secondary | ICD-10-CM | POA: Diagnosis not present

## 2023-01-16 NOTE — Patient Instructions (Signed)
Follow up appointment: with Dr. Antoine Poche on August 8   Take your BP meds as follows:  Try cutting the candesartan tablet in half and doing half at 9:15 and the other half at bedtime.    Continue with all other medications  Check your blood pressure at home daily (if able) and keep record of the readings.  Hypertension "High blood pressure"  Hypertension is often called "The Silent Killer." It rarely causes symptoms until it is extremely  high or has done damage to other organs in the body. For this reason, you should have your  blood pressure checked regularly by your physician. We will check your blood pressure  every time you see a provider at one of our offices.   Your blood pressure reading consists of two numbers. Ideally, blood pressure should be  below 120/80. The first ("top") number is called the systolic pressure. It measures the  pressure in your arteries as your heart beats. The second ("bottom") number is called the diastolic pressure. It measures the pressure in your arteries as the heart relaxes between beats.  The benefits of getting your blood pressure under control are enormous. A 10-point  reduction in systolic blood pressure can reduce your risk of stroke by 27% and heart failure by 28%  Your blood pressure goal is < 130/80  To check your pressure at home you will need to:  1. Sit up in a chair, with feet flat on the floor and back supported. Do not cross your ankles or legs. 2. Rest your left arm so that the cuff is about heart level. If the cuff goes on your upper arm,  then just relax the arm on the table, arm of the chair or your lap. If you have a wrist cuff, we  suggest relaxing your wrist against your chest (think of it as Pledging the Flag with the  wrong arm).  3. Place the cuff snugly around your arm, about 1 inch above the crook of your elbow. The  cords should be inside the groove of your elbow.  4. Sit quietly, with the cuff in place, for about 5  minutes. After that 5 minutes press the power  button to start a reading. 5. Do not talk or move while the reading is taking place.  6. Record your readings on a sheet of paper. Although most cuffs have a memory, it is often  easier to see a pattern developing when the numbers are all in front of you.  7. You can repeat the reading after 1-3 minutes if it is recommended  Make sure your bladder is empty and you have not had caffeine or tobacco within the last 30 min  Always bring your blood pressure log with you to your appointments. If you have not brought your monitor in to be double checked for accuracy, please bring it to your next appointment.  You can find a list of quality blood pressure cuffs at validatebp.org

## 2023-01-16 NOTE — Progress Notes (Signed)
Office Visit    Patient Name: Christina Henderson Date of Encounter: 01/16/2023  Primary Care Provider:  Gweneth Dimitri, MD Primary Cardiologist:  Rollene Rotunda, MD  Chief Complaint    Hypertension  Significant Past Medical History   hyperlipidemia 4/24 LDL 108 - on rosuvastatin 10  bradycardia HR stable in 50-60 range    Allergies  Allergen Reactions   Caffeine     Triggers rosacea   Statins Other (See Comments)    CAUSES MENTAL CHARGES, DYSLARIA   Sulfa Antibiotics Other (See Comments)    Not sure, childhood allergy    History of Present Illness    Christina Henderson is a 87 y.o. female patient of Dr Antoine Poche, in the office today for hypertension evaluation.   She was seen by Dr. Antoine Poche last month with good blood pressure control (118/50), but had just switched from olmesartan to candesartan in the previous week.  While she was 118/50 in the office, her home readings were more in the 150 systolic range.  She was asked to monitor home readings twice daily and return for follow up.  She notes she was first diagnosed with hypertension about 20 years ago.  She was most recently on olmesartan, but it caused her to have a metallic taste, so it was switched to candesartan.  She notes the taste is still there, but not nearly as strong and she is fine with the current medication.  When I saw her in late May her pressure was 183/77, and spironolactone 25 mg daily was added.  About 10 days later she reported increased dizziness, and pressure well overall, with 2 readings < 100 systolic.  She was advised to decrease hydralazine by 10 mg in morning and evening.  Today she returns for follow up.   She brings in her home meter and it reads within 1 point of the office device.  She does note that she gets a little dizziness in the mornings, about an hour after her candesartan dose.  Doesn't happen every day, and not always on days that she swims.  States that it lasts for 10-20 minutes  and is not problematic unless she is in the car.  She had a recent needle biopsy on her thyroid, which was benign,    Blood Pressure Goal:  130/80  Current Medications:  candesartan 32 mg qd, amlodipine 10 mg qd, hydralazine 30/40/30 mg tid, spironolactone 25 mg every day   Previously tried:  olmesartan - metallic taste  Family Hx: mother died CHF, also had RA; father had CAD (CABG, carotid endarterectomy - died at 37 from first MI); brother died COVID at 70; 3 kids (1 adopted), no health issues  Social Hx:      Tobacco: none - quit in 1970  Alcohol: none - triggers rosacea  Caffeine:  decaf coffee in the morning, no other caffeine (triggers rosacea)  Diet:  eats 1 meal in the dining hall most days - lunch or dinner; breakfast is english muffin, egg white, cheese, [place] sausage; doesn't snack much, cheese is most common;  does not add salt, but does get some in dining hall foods  Exercise: swims 4-5 days per week, 40 minutes  ([facility] has pool)  Home BP readings: home [employer] device, reads within a point of the office device.  25 AM readings average 120/56  HR 50  (range 88-137/46-61), previous average 139/65  HR 54 22 PM readings average 135/63  HR 56  (range 113-164/52-71), previous average 162/74  HR 60  Adherence Assessment  Do you ever forget to take your medication? [] Yes [x] No  Do you ever skip doses due to side effects? [] Yes [x] No  Do you have trouble affording your medicines? [] Yes [x] No  Are you ever unable to pick up your medication due to transportation difficulties? [] Yes [x] No  Do you ever stop taking your medications because you don't believe they are helping? [] Yes [x] No    Accessory Clinical Findings    Lab Results  Component Value Date   CREATININE 0.82 12/23/2022   BUN 19 12/23/2022   NA 140 12/23/2022   K 4.9 12/23/2022   CL 104 12/23/2022   CO2 25 12/23/2022   Lab Results  Component Value Date   ALT 25 05/03/2017   AST 24 05/03/2017    ALKPHOS 63 05/03/2017   BILITOT 1.0 05/03/2017   No results found for: "HGBA1C"  Home Medications    Current Outpatient Medications  Medication Sig Dispense Refill   amLODipine (NORVASC) 10 MG tablet Take 1 tablet (10 mg total) by mouth at bedtime. 90 tablet 3   anastrozole (ARIMIDEX) 1 MG tablet Take 1 tablet (1 mg total) by mouth daily. 90 tablet 3   candesartan (ATACAND) 32 MG tablet Take 32 mg by mouth daily.     fluocinonide cream (LIDEX) 0.05 % Apply 1 application topically 2 (two) times daily.     hydrALAZINE (APRESOLINE) 10 MG tablet Take 2 tablets in the morning and 3 tablets in the afternoon and evening 990 tablet 2   hydroxychloroquine (PLAQUENIL) 200 MG tablet Take 2 tablets (400 mg total) by mouth daily.     ibandronate (BONIVA) 150 MG tablet Take 1 tablet by mouth every 30 (thirty) days.     Multiple Vitamin (MULTIVITAMIN WITH MINERALS) TABS tablet Take 1 tablet by mouth daily.     Probiotic Product (PROBIOTIC ACIDOPHILUS BIOBEADS) CAPS Take by mouth daily.      rosuvastatin (CRESTOR) 10 MG tablet Take 10 mg by mouth daily.     spironolactone (ALDACTONE) 25 MG tablet Take 1 tablet (25 mg total) by mouth daily. 90 tablet 3   No current facility-administered medications for this visit.     HYPERTENSION CONTROL Vitals:   01/16/23 0220 01/16/23 1014  BP: (!) 142/63 (!) 141/64    The patient's blood pressure is elevated above target today.  In order to address the patient's elevated BP: A current anti-hypertensive medication was adjusted today.     Assessment & Plan    Essential hypertension Assessment: BP is uncontrolled in office BP 141/64 mmHg;  above the goal (<130/80). Home readings in am average 120/56, evenings 135/63 Tolerates current medications well without any side effects Denies SOB, palpitation, chest pain, headaches,or swelling Reiterated the importance of regular exercise and low salt diet   Plan:  Continue taking current medications Patient to  keep record of BP readings with heart rate and report to [place] at the next visit Patient to follow up with Dr. Antoine Poche in 1 month  If morning dizziness continues, consider further decrease of early morning hydralazine dose  Labs ordered today:  none   Phillips Hay PharmD CPP [facility]  [address] [place], [place] [address] [phone]

## 2023-01-16 NOTE — Assessment & Plan Note (Addendum)
Assessment: BP is uncontrolled in office BP 141/64 mmHg;  above the goal (<130/80). Home readings in am average 120/56, evenings 135/63 Tolerates current medications well without any side effects Denies SOB, palpitation, chest pain, headaches,or swelling Reiterated the importance of regular exercise and low salt diet   Plan:  Continue taking current medications Patient to keep record of BP readings with heart rate and report to [place] at the next visit Patient to follow up with Dr. Antoine Poche in 1 month  If morning dizziness continues, consider further decrease of early morning hydralazine dose  Labs ordered today:  none

## 2023-01-24 DIAGNOSIS — G5601 Carpal tunnel syndrome, right upper limb: Secondary | ICD-10-CM | POA: Diagnosis not present

## 2023-01-26 DIAGNOSIS — E041 Nontoxic single thyroid nodule: Secondary | ICD-10-CM | POA: Diagnosis not present

## 2023-01-26 DIAGNOSIS — R1310 Dysphagia, unspecified: Secondary | ICD-10-CM | POA: Diagnosis not present

## 2023-01-26 DIAGNOSIS — R0989 Other specified symptoms and signs involving the circulatory and respiratory systems: Secondary | ICD-10-CM | POA: Diagnosis not present

## 2023-02-02 DIAGNOSIS — M1991 Primary osteoarthritis, unspecified site: Secondary | ICD-10-CM | POA: Diagnosis not present

## 2023-02-02 DIAGNOSIS — Z79899 Other long term (current) drug therapy: Secondary | ICD-10-CM | POA: Diagnosis not present

## 2023-02-02 DIAGNOSIS — M0579 Rheumatoid arthritis with rheumatoid factor of multiple sites without organ or systems involvement: Secondary | ICD-10-CM | POA: Diagnosis not present

## 2023-02-02 DIAGNOSIS — Z6824 Body mass index (BMI) 24.0-24.9, adult: Secondary | ICD-10-CM | POA: Diagnosis not present

## 2023-02-02 DIAGNOSIS — G5601 Carpal tunnel syndrome, right upper limb: Secondary | ICD-10-CM | POA: Diagnosis not present

## 2023-02-02 DIAGNOSIS — L409 Psoriasis, unspecified: Secondary | ICD-10-CM | POA: Diagnosis not present

## 2023-02-09 DIAGNOSIS — H401132 Primary open-angle glaucoma, bilateral, moderate stage: Secondary | ICD-10-CM | POA: Diagnosis not present

## 2023-02-09 DIAGNOSIS — H43813 Vitreous degeneration, bilateral: Secondary | ICD-10-CM | POA: Diagnosis not present

## 2023-02-09 DIAGNOSIS — M0570 Rheumatoid arthritis with rheumatoid factor of unspecified site without organ or systems involvement: Secondary | ICD-10-CM | POA: Diagnosis not present

## 2023-02-09 DIAGNOSIS — H35033 Hypertensive retinopathy, bilateral: Secondary | ICD-10-CM | POA: Diagnosis not present

## 2023-02-09 DIAGNOSIS — Z7901 Long term (current) use of anticoagulants: Secondary | ICD-10-CM | POA: Diagnosis not present

## 2023-02-13 DIAGNOSIS — M79672 Pain in left foot: Secondary | ICD-10-CM | POA: Diagnosis not present

## 2023-02-13 DIAGNOSIS — M7672 Peroneal tendinitis, left leg: Secondary | ICD-10-CM | POA: Diagnosis not present

## 2023-02-13 DIAGNOSIS — M76812 Anterior tibial syndrome, left leg: Secondary | ICD-10-CM | POA: Diagnosis not present

## 2023-02-15 ENCOUNTER — Encounter: Payer: Self-pay | Admitting: Cardiology

## 2023-02-15 DIAGNOSIS — R0602 Shortness of breath: Secondary | ICD-10-CM | POA: Insufficient documentation

## 2023-02-15 NOTE — Progress Notes (Deleted)
  Cardiology Office Note:   Date:  02/15/2023  ID:  Christina Henderson, DOB Aug 21, 1935, MRN [id number] PCP: Gweneth Dimitri, MD  [facility] Providers Cardiologist:  Rollene Rotunda, MD {  History of Present Illness:   Christina Henderson is a 87 y.o. female who presents for follow up of chest pain.   She had a negative POET (Plain Old Exercise Treadmill) in 2019.  Since I last saw her ***   *** had started her on olmesartan.  However, she had a metallic taste in her mouth so she switched to candesartan.  She just started this.  Her blood pressure diary which is fairly complete suggested she is typically in the 150s systolic with diastolics are well-controlled.  He runs a little bit lower limit higher at times.  She still swimming for exercise.  She says she feels well doing this but other activities might make her slightly short of breath.  She might notice this with just doing chores around her home.  She is not having any chest pressure, neck or arm discomfort.  She is not having any palpitations, presyncope or syncope.  She has had no weight gain or edema.   ROS: ***  Studies Reviewed:    EKG:       ***  Risk Assessment/Calculations:   {Does this patient have ATRIAL FIBRILLATION?:[phone]} No BP recorded.  {Refresh Note OR Click here to enter BP  :1}***        Physical Exam:   VS:  There were no vitals taken for this visit.   Wt Readings from Last 3 Encounters:  11/17/22 139 lb (63 kg)  08/17/22 142 lb 12.8 oz (64.8 kg)  05/27/22 142 lb (64.4 kg)     GEN: Well nourished, well developed in no acute distress NECK: No JVD; No carotid bruits CARDIAC: ***RRR, no murmurs, rubs, gallops RESPIRATORY:  Clear to auscultation without rales, wheezing or rhonchi  ABDOMEN: Soft, non-tender, non-distended EXTREMITIES:  No edema; No deformity   ASSESSMENT AND PLAN:   HTN:   ***  Her blood pressure is not quite at target but she just started candesartan.  She is going to keep  a blood pressure diary for little bit longer and send that to me.  If she has still blood pressure is not at target I might add a low-dose of spironolactone.    Shortness of breath:   ***  I will check a BNP.  If she continues to have progressive shortness of breath I will have a low threshold for echocardiography given the slight murmur.    {Are you ordering a CV Procedure (e.g. stress test, cath, DCCV, TEE, etc)?   Press F2        :[id number]}  Follow up ***  Signed, Rollene Rotunda, MD

## 2023-02-16 ENCOUNTER — Ambulatory Visit: Payer: Medicare Other | Admitting: Cardiology

## 2023-02-16 DIAGNOSIS — I10 Essential (primary) hypertension: Secondary | ICD-10-CM

## 2023-02-16 DIAGNOSIS — R0602 Shortness of breath: Secondary | ICD-10-CM

## 2023-02-16 MED ORDER — HYDRALAZINE HCL 50 MG PO TABS: 50.0000 mg | ORAL_TABLET | Freq: Every morning | ORAL | 3 refills | Status: DC

## 2023-02-27 DIAGNOSIS — M25572 Pain in left ankle and joints of left foot: Secondary | ICD-10-CM | POA: Diagnosis not present

## 2023-03-02 DIAGNOSIS — S92352A Displaced fracture of fifth metatarsal bone, left foot, initial encounter for closed fracture: Secondary | ICD-10-CM | POA: Diagnosis not present

## 2023-03-02 DIAGNOSIS — M19072 Primary osteoarthritis, left ankle and foot: Secondary | ICD-10-CM | POA: Diagnosis not present

## 2023-03-02 DIAGNOSIS — M7732 Calcaneal spur, left foot: Secondary | ICD-10-CM | POA: Diagnosis not present

## 2023-03-02 DIAGNOSIS — M79672 Pain in left foot: Secondary | ICD-10-CM | POA: Diagnosis not present

## 2023-03-08 DIAGNOSIS — S92355A Nondisplaced fracture of fifth metatarsal bone, left foot, initial encounter for closed fracture: Secondary | ICD-10-CM | POA: Diagnosis not present

## 2023-03-14 ENCOUNTER — Other Ambulatory Visit: Payer: Self-pay | Admitting: Hematology and Oncology

## 2023-03-14 DIAGNOSIS — Z1231 Encounter for screening mammogram for malignant neoplasm of breast: Secondary | ICD-10-CM

## 2023-03-29 DIAGNOSIS — S92355A Nondisplaced fracture of fifth metatarsal bone, left foot, initial encounter for closed fracture: Secondary | ICD-10-CM | POA: Diagnosis not present

## 2023-04-06 DIAGNOSIS — Z1331 Encounter for screening for depression: Secondary | ICD-10-CM | POA: Diagnosis not present

## 2023-04-06 DIAGNOSIS — Z0000 Encounter for general adult medical examination without abnormal findings: Secondary | ICD-10-CM | POA: Diagnosis not present

## 2023-04-06 DIAGNOSIS — Z23 Encounter for immunization: Secondary | ICD-10-CM | POA: Diagnosis not present

## 2023-04-06 DIAGNOSIS — Z6825 Body mass index (BMI) 25.0-25.9, adult: Secondary | ICD-10-CM | POA: Diagnosis not present

## 2023-04-10 DIAGNOSIS — Z6824 Body mass index (BMI) 24.0-24.9, adult: Secondary | ICD-10-CM | POA: Diagnosis not present

## 2023-04-10 DIAGNOSIS — D0511 Intraductal carcinoma in situ of right breast: Secondary | ICD-10-CM | POA: Diagnosis not present

## 2023-04-10 DIAGNOSIS — S92356D Nondisplaced fracture of fifth metatarsal bone, unspecified foot, subsequent encounter for fracture with routine healing: Secondary | ICD-10-CM | POA: Diagnosis not present

## 2023-04-10 DIAGNOSIS — E785 Hyperlipidemia, unspecified: Secondary | ICD-10-CM | POA: Diagnosis not present

## 2023-04-10 DIAGNOSIS — M069 Rheumatoid arthritis, unspecified: Secondary | ICD-10-CM | POA: Diagnosis not present

## 2023-04-10 DIAGNOSIS — I10 Essential (primary) hypertension: Secondary | ICD-10-CM | POA: Diagnosis not present

## 2023-04-10 DIAGNOSIS — Z79899 Other long term (current) drug therapy: Secondary | ICD-10-CM | POA: Diagnosis not present

## 2023-04-10 DIAGNOSIS — M810 Age-related osteoporosis without current pathological fracture: Secondary | ICD-10-CM | POA: Diagnosis not present

## 2023-04-10 DIAGNOSIS — E041 Nontoxic single thyroid nodule: Secondary | ICD-10-CM | POA: Diagnosis not present

## 2023-04-25 DIAGNOSIS — G5601 Carpal tunnel syndrome, right upper limb: Secondary | ICD-10-CM | POA: Diagnosis not present

## 2023-04-28 ENCOUNTER — Ambulatory Visit: Payer: Medicare Other

## 2023-05-05 ENCOUNTER — Ambulatory Visit
Admission: RE | Admit: 2023-05-05 | Discharge: 2023-05-05 | Disposition: A | Discharge status: Home / Self Care | Payer: Medicare Other | Source: Ambulatory Visit | Attending: Hematology and Oncology

## 2023-05-05 DIAGNOSIS — Z1231 Encounter for screening mammogram for malignant neoplasm of breast: Secondary | ICD-10-CM | POA: Diagnosis not present

## 2023-05-07 NOTE — Progress Notes (Unsigned)
Cardiology Office Note:   Date:  05/10/2023  ID:  Christina Henderson, DOB [birth year], MRN [id number] PCP: Gweneth Dimitri, MD  [facility] Providers Cardiologist:  Rollene Rotunda, MD {  History of Present Illness:   Christina Henderson is a 87 y.o. female who presents for follow up of chest pain. She had a negative POET (Plain Old Exercise Treadmill) in 2019.  She had shortness of breath when I saw her recently.  BNP was normal.  Since I last saw her she has done well.  She did break her foot.  She says her blood pressure is actually low in the morning she is feeling lightheaded so she is wondering if she can cut down her hydralazine.  She is not yet back to swimming.  She does quilting as a hobby.  She does stay active at home.  She is about to drive up to [place].  The patient denies any new symptoms such as chest discomfort, neck or arm discomfort. There has been no new shortness of breath, PND or orthopnea. There have been no reported palpitations, presyncope or syncope.   When I saw her previously she was short of breath.  However, she lost about 10 pounds because she was having a lot of dental problems.  She said when she lost that weight her breathing is better.  ROS:   Positive for insomnia and otherwise negative for all other systems.  Studies Reviewed:    EKG:   EKG Interpretation Date/Time:  Wednesday May 10 2023 15:26:49 EDT Ventricular Rate:  58 PR Interval:  150 QRS Duration:  86 QT Interval:  446 QTC Calculation: 437 R Axis:   11  Text Interpretation: Sinus bradycardia When compared with ECG of 03-May-2017 13:06, Nonspecific T wave abnormality has replaced inverted T waves in Inferior leads Confirmed by Rollene Rotunda ([id number]) on 05/10/2023 4:24:26 PM    Risk Assessment/Calculations:              Physical Exam:   VS:  BP 128/84   Pulse (!) 54   Ht 5\' 1"  (1.549 m)   Wt 135 lb 12.8 oz (61.6 kg)   SpO2 98%   BMI 25.66 kg/m    Wt Readings from  Last 3 Encounters:  05/10/23 135 lb 12.8 oz (61.6 kg)  11/17/22 139 lb (63 kg)  08/17/22 142 lb 12.8 oz (64.8 kg)     GEN: Well nourished, well developed in no acute distress NECK: No JVD; No carotid bruits CARDIAC: RRR, 2 out of 6 apical systolic murmur heard best at the right upper sternal border and early peaking, no diastolic murmurs, rubs, gallops RESPIRATORY:  Clear to auscultation without rales, wheezing or rhonchi  ABDOMEN: Soft, non-tender, non-distended EXTREMITIES:  No edema; No deformity   ASSESSMENT AND PLAN:   HTN:   Her blood pressure is lower so I am going to reduce her hydralazine to 40 mg in the morning.  She will continue with the other meds as listed.   Shortness of breath:   This is improved.  No further workup is indicated.   Murmur: I suspect some mild aortic stenosis and I will check an echocardiogram.      Follow up with me in 6 months  Signed, Rollene Rotunda, MD

## 2023-05-10 ENCOUNTER — Encounter: Payer: Self-pay | Admitting: Cardiology

## 2023-05-10 ENCOUNTER — Ambulatory Visit: Payer: Medicare Other | Attending: Cardiology | Admitting: Cardiology

## 2023-05-10 VITALS — BP 128/84 | HR 54 | Ht 61.0 in | Wt 135.8 lb

## 2023-05-10 DIAGNOSIS — I10 Essential (primary) hypertension: Secondary | ICD-10-CM | POA: Diagnosis not present

## 2023-05-10 DIAGNOSIS — R0602 Shortness of breath: Secondary | ICD-10-CM | POA: Insufficient documentation

## 2023-05-10 DIAGNOSIS — R011 Cardiac murmur, unspecified: Secondary | ICD-10-CM | POA: Insufficient documentation

## 2023-05-10 NOTE — Patient Instructions (Signed)
Medication Instructions:  Decrease Hydralazine to 4 tablets ( 40 mg ) every morning Continue all other medications *If you need a refill on your cardiac medications before your next appointment, please call your pharmacy*   Lab Work: None ordered   Testing/Procedures: Schedule Echo  first available   Follow-Up: At [facility], you and your health needs are our priority.  As part of our continuing mission to provide you with exceptional heart care, we have created designated Provider Care Teams.  These Care Teams include your primary Cardiologist (physician) and Advanced Practice Providers (APPs -  Physician Assistants and Nurse Practitioners) who all work together to provide you with the care you need, when you need it.  We recommend signing up for the patient portal called "MyChart".  Sign up information is provided on this After Visit Summary.  MyChart is used to connect with patients for Virtual Visits (Telemedicine).  Patients are able to view lab/test results, encounter notes, upcoming appointments, etc.  Non-urgent messages can be sent to your provider as well.   To learn more about what you can do with MyChart, go to [email or web address].    Your next appointment:  6 months    Call in Jan to schedule April appointment     Provider:  Dr.Hochrein

## 2023-05-24 DIAGNOSIS — L400 Psoriasis vulgaris: Secondary | ICD-10-CM | POA: Diagnosis not present

## 2023-05-26 ENCOUNTER — Ambulatory Visit (HOSPITAL_BASED_OUTPATIENT_CLINIC_OR_DEPARTMENT_OTHER): Payer: Medicare Other

## 2023-05-26 DIAGNOSIS — R0602 Shortness of breath: Secondary | ICD-10-CM | POA: Diagnosis not present

## 2023-05-26 DIAGNOSIS — I10 Essential (primary) hypertension: Secondary | ICD-10-CM

## 2023-05-26 DIAGNOSIS — R011 Cardiac murmur, unspecified: Secondary | ICD-10-CM

## 2023-05-26 LAB — ECHOCARDIOGRAM COMPLETE
AR max vel: 1.94 cm2
AV Area VTI: 1.96 cm2
AV Area mean vel: 1.91 cm2
AV Mean grad: 10 mm[Hg]
AV Peak grad: 18 mm[Hg]
Ao pk vel: 2.12 m/s
Area-P 1/2: 2.37 cm2
MV M vel: 3.61 m/s
MV Peak grad: 52.1 mm[Hg]
S' Lateral: 2.17 cm

## 2023-05-29 ENCOUNTER — Inpatient Hospital Stay: Payer: Medicare Other | Attending: Hematology and Oncology | Admitting: Hematology and Oncology

## 2023-05-29 ENCOUNTER — Other Ambulatory Visit: Payer: Self-pay

## 2023-05-29 VITALS — BP 131/45 | HR 53 | Temp 97.8°F | Resp 18 | Ht 61.0 in | Wt 132.7 lb

## 2023-05-29 DIAGNOSIS — M069 Rheumatoid arthritis, unspecified: Secondary | ICD-10-CM | POA: Insufficient documentation

## 2023-05-29 DIAGNOSIS — M810 Age-related osteoporosis without current pathological fracture: Secondary | ICD-10-CM | POA: Diagnosis not present

## 2023-05-29 DIAGNOSIS — Z170 Estrogen receptor positive status [ER+]: Secondary | ICD-10-CM | POA: Insufficient documentation

## 2023-05-29 DIAGNOSIS — C50411 Malignant neoplasm of upper-outer quadrant of right female breast: Secondary | ICD-10-CM | POA: Diagnosis not present

## 2023-05-29 DIAGNOSIS — Z79899 Other long term (current) drug therapy: Secondary | ICD-10-CM | POA: Diagnosis not present

## 2023-05-29 DIAGNOSIS — Z79811 Long term (current) use of aromatase inhibitors: Secondary | ICD-10-CM | POA: Insufficient documentation

## 2023-05-29 DIAGNOSIS — L409 Psoriasis, unspecified: Secondary | ICD-10-CM | POA: Diagnosis not present

## 2023-05-29 MED ORDER — ANASTROZOLE 1 MG PO TABS: 1.0000 mg | ORAL_TABLET | Freq: Every day | ORAL | 3 refills | Status: DC

## 2023-05-29 NOTE — Assessment & Plan Note (Addendum)
05/09/2017: Right lumpectomy: IDC grade 1, 1.1 cm, low-grade DCIS, margins negative, 0/3 lymph nodes negative ER 100%, PR 100%, HER-2 negative ratio 1.26, Ki-67 1%, T1CN0 stage I a; left lumpectomy: Intraductal papilloma, ADH   Current treatment: Anastrozole 1 mg p.o. daily started 05/23/2017 1 more year of anastrozole recommended   Anastrozole toxicities: Denies any side effects.  She has rheumatoid arthritis which causes her some joint aches and stiffness. She also has psoriasis for which she is receiving light therapy.  She is hoping that this will work to get rid of the psoriasis.   Breast cancer surveillance: 1. Mammogram 05/09/2023: Benign breast density category B 2. Breast exam 05/29/2023: Benign 3. Bone density 04/26/2022: T score -2.7: Osteoporosis: She took Boniva for 5 years and discontinued it recently.   She talked about her life growing up in [place]. Return to clinic in 1 year for follow-up and after that she will be seen on an as-needed basis.

## 2023-05-29 NOTE — Progress Notes (Signed)
Patient Care Team: Gweneth Dimitri, MD as PCP - General (Family Medicine) Rollene Rotunda, MD as PCP - Cardiology (Cardiology) Serena Croissant, MD as Consulting Physician (Hematology and Oncology) Axel Filler, Larna Daughters, NP as Nurse Practitioner (Hematology and Oncology) Harriette Bouillon, MD as Consulting Physician (General Surgery) Lonie Peak, MD as Attending Physician (Radiation Oncology) Rollene Rotunda, MD as Consulting Physician (Cardiology)  DIAGNOSIS:  Encounter Diagnosis  Name Primary?   Malignant neoplasm of upper-outer quadrant of right breast in female, estrogen receptor positive (HCC) Yes    SUMMARY OF ONCOLOGIC HISTORY: Oncology History  Malignant neoplasm of upper-outer quadrant of right breast in female, estrogen receptor positive (HCC)  04/12/2017 Initial Diagnosis   right breast suspicious mass at 12 o'clock position 1.1 cm; left breast 1 o'clock position 0.6 cm   05/09/2017 Surgery   Right lumpectomy: IDC grade 1, 1.1 cm, low-grade DCIS, margins negative, 0/3 lymph nodes negative ER 100%, PR 100%, HER-2 negative ratio 1.26, Ki-67 1%, T1CN0 stage I a; left lumpectomy: Intraductal papilloma, ADH   05/2017 -  Anti-estrogen oral therapy   Anastrozole daily     CHIEF COMPLIANT: Follow-up on anastrozole therapy    History of Present Illness   The patient, with a history of breast cancer, presents for a routine follow-up and medication review. She has been on anastrozole for six years and is willing to continue for one more year to complete the full seven-year course. She reports no adverse reactions to the medication. However, she has noticed some bone density loss, with a recent bone density test showing a score of -2.8.  In addition to the anastrozole, the patient is also on hydralazine for blood pressure control. She takes 40mg  in the morning, 40mg  at 1:15pm, and 30mg  at night, for a total daily dose of 110mg .  The patient also reports the development of a  goiter, which has been evaluated with ultrasound and needle biopsy. She suspects the goiter may be due to a lack of iodine in her diet, as she has been avoiding salt due to her high blood pressure.  The patient has stopped taking ibandronate, after five to six years of use. She has had a recent mammogram, the results of which were satisfactory.         ALLERGIES:  is allergic to caffeine, statins, and sulfa antibiotics.  MEDICATIONS:  Current Outpatient Medications  Medication Sig Dispense Refill   amLODipine (NORVASC) 10 MG tablet Take 1 tablet (10 mg total) by mouth at bedtime. 90 tablet 3   anastrozole (ARIMIDEX) 1 MG tablet Take 1 tablet (1 mg total) by mouth daily. 90 tablet 3   candesartan (ATACAND) 32 MG tablet Take 32 mg by mouth daily.     fluocinonide cream (LIDEX) 0.05 % Apply 1 application topically 2 (two) times daily.     hydrALAZINE (APRESOLINE) 10 MG tablet Take by mouth 3 (three) times daily. 40 mg in AM, 40 mg at Lunch and 30 mg at PM     hydroxychloroquine (PLAQUENIL) 200 MG tablet Take 2 tablets (400 mg total) by mouth daily.     Multiple Vitamin (MULTIVITAMIN WITH MINERALS) TABS tablet Take 1 tablet by mouth daily.     Probiotic Product (PROBIOTIC ACIDOPHILUS BIOBEADS) CAPS Take by mouth daily.      rosuvastatin (CRESTOR) 10 MG tablet Take 10 mg by mouth daily.     spironolactone (ALDACTONE) 25 MG tablet Take 1 tablet (25 mg total) by mouth daily. 90 tablet 3   No current facility-administered medications  for this visit.    PHYSICAL EXAMINATION: ECOG PERFORMANCE STATUS: 1 - Symptomatic but completely ambulatory  Vitals:   05/29/23 1046  BP: (!) 131/45  Pulse: (!) 53  Resp: 18  Temp: 97.8 F (36.6 C)  SpO2: 99%   Filed Weights   05/29/23 1046  Weight: 132 lb 11.2 oz (60.2 kg)    Physical Exam no palpable lumps or nodules to breast exam        (exam performed in the presence of a chaperone)  LABORATORY DATA:  I have reviewed the data as listed     Latest Ref Rng & Units 12/23/2022    9:23 AM 05/03/2017    3:06 PM  CMP  Glucose 70 - 99 mg/dL 88  88   BUN 8 - 27 mg/dL 19  24   Creatinine 1.61 - 1.00 mg/dL 0.96  0.45   Sodium 409 - 144 mmol/L 140  140   Potassium 3.5 - 5.2 mmol/L 4.9  4.2   Chloride 96 - 106 mmol/L 104  105   CO2 20 - 29 mmol/L 25  28   Calcium 8.7 - 10.3 mg/dL 81.1  91.4   Total Protein 6.5 - 8.1 g/dL  7.1   Total Bilirubin 0.3 - 1.2 mg/dL  1.0   Alkaline Phos 38 - 126 U/L  63   AST 15 - 41 U/L  24   ALT 14 - 54 U/L  25     Lab Results  Component Value Date   WBC 6.7 05/03/2017   HGB 14.3 05/03/2017   HCT 43.9 05/03/2017   MCV 93.8 05/03/2017   PLT 233 05/03/2017   NEUTROABS 4.1 05/03/2017    ASSESSMENT & PLAN:  Malignant neoplasm of upper-outer quadrant of right breast in female, estrogen receptor positive (HCC) 05/09/2017: Right lumpectomy: IDC grade 1, 1.1 cm, low-grade DCIS, margins negative, 0/3 lymph nodes negative ER 100%, PR 100%, HER-2 negative ratio 1.26, Ki-67 1%, T1CN0 stage I a; left lumpectomy: Intraductal papilloma, ADH   Current treatment: Anastrozole 1 mg p.o. daily started 05/23/2017 1 more year of anastrozole recommended   Anastrozole toxicities: Denies any side effects.  She has rheumatoid arthritis which causes her some joint aches and stiffness. She also has psoriasis for which she is receiving light therapy.  She is hoping that this will work to get rid of the psoriasis.   Breast cancer surveillance: 1. Mammogram 05/09/2023: Benign breast density category B 2. Breast exam 05/29/2023: Benign 3. Bone density 04/26/2022: T score -2.7: Osteoporosis: She took Boniva for 5 years and discontinued it recently.   She talked about her life growing up in [place]. Return to clinic in 1 year for follow-up and after that she will be seen on an as-needed basis.    No orders of the defined types were placed in this encounter.  The patient has a good understanding of the overall plan. she  agrees with it. she will call with any problems that may develop before the next visit here. Total time spent: 30 mins including face to face time and time spent for planning, charting and co-ordination of care   [name], MD 05/29/23

## 2023-06-07 ENCOUNTER — Encounter: Payer: Self-pay | Admitting: *Deleted

## 2023-07-04 DIAGNOSIS — M25551 Pain in right hip: Secondary | ICD-10-CM | POA: Diagnosis not present

## 2023-07-04 DIAGNOSIS — M545 Low back pain, unspecified: Secondary | ICD-10-CM | POA: Diagnosis not present

## 2023-07-04 DIAGNOSIS — M25552 Pain in left hip: Secondary | ICD-10-CM | POA: Diagnosis not present

## 2023-08-01 ENCOUNTER — Encounter (HOSPITAL_BASED_OUTPATIENT_CLINIC_OR_DEPARTMENT_OTHER): Payer: Self-pay | Admitting: Physical Therapy

## 2023-08-01 ENCOUNTER — Ambulatory Visit (HOSPITAL_BASED_OUTPATIENT_CLINIC_OR_DEPARTMENT_OTHER): Payer: Medicare Other | Attending: Sports Medicine | Admitting: Physical Therapy

## 2023-08-01 ENCOUNTER — Other Ambulatory Visit: Payer: Self-pay

## 2023-08-01 DIAGNOSIS — M6281 Muscle weakness (generalized): Secondary | ICD-10-CM | POA: Insufficient documentation

## 2023-08-01 DIAGNOSIS — L570 Actinic keratosis: Secondary | ICD-10-CM | POA: Diagnosis not present

## 2023-08-01 DIAGNOSIS — R2689 Other abnormalities of gait and mobility: Secondary | ICD-10-CM | POA: Insufficient documentation

## 2023-08-01 DIAGNOSIS — L400 Psoriasis vulgaris: Secondary | ICD-10-CM | POA: Diagnosis not present

## 2023-08-01 DIAGNOSIS — R29898 Other symptoms and signs involving the musculoskeletal system: Secondary | ICD-10-CM | POA: Diagnosis not present

## 2023-08-01 DIAGNOSIS — M5459 Other low back pain: Secondary | ICD-10-CM | POA: Insufficient documentation

## 2023-08-01 NOTE — Therapy (Signed)
OUTPATIENT PHYSICAL THERAPY THORACOLUMBAR EVALUATION   Patient Name: Christina Henderson MRN: [id number] DOB:07/14/1935, 88 y.o., female Today's Date: 08/01/2023  END OF SESSION:  PT End of Session - 08/01/23 1514     Visit Number 1    Number of Visits 16    Date for PT Re-Evaluation 09/26/23    Authorization Type Medicare    Progress Note Due on Visit 10    PT Start Time 1515    PT Stop Time 1555    PT Time Calculation (min) 40 min    Activity Tolerance Patient tolerated treatment well    Behavior During Therapy [facility] for tasks assessed/performed             Past Medical History:  Diagnosis Date   Arthritis    rhuematoid arthritis   Breast cancer (HCC) 2018   Right Breast Cancer   Cancer (HCC) 03/2017   right breast cancer   Complication of anesthesia    hard to wake up   GERD (gastroesophageal reflux disease)    food related   Glaucoma    Hypertension    Osteoporosis    osteopenia   Rosacea    Past Surgical History:  Procedure Laterality Date   APPENDECTOMY     BACK SURGERY     BREAST BIOPSY     BREAST LUMPECTOMY Right 04/2017   BREAST LUMPECTOMY WITH RADIOACTIVE SEED AND SENTINEL LYMPH NODE BIOPSY Bilateral 05/09/2017   Procedure: BILATERAL RADIOACTIVE SEED GUIDED LUMPECTOMIES WITH RIGHT SENTINEL LYMPH NODE BIOPSY;  Surgeon: Harriette Bouillon, MD;  Location: [facility];  Service: General;  Laterality: Bilateral;   EYE SURGERY     cataract   SPINE SURGERY     lumbar lamenectomy   TONSILLECTOMY     Patient Active Problem List   Diagnosis Date Noted   SOB (shortness of breath) 02/15/2023   Bradycardia 08/15/2022   Dyslipidemia 11/25/2019   Educated about COVID-19 virus infection 11/25/2019   Chest pain 07/17/2018   Cough 07/17/2018   Essential hypertension 07/17/2018   Malignant neoplasm of upper-outer quadrant of right breast in female, estrogen receptor positive (HCC) 05/23/2017    PCP: Gweneth Dimitri MD  REFERRING PROVIDER:  Hurman Horn, MD  REFERRING DIAG: M54.50 (ICD-10-CM) - Low back pain, unspecified; bilateral hip pain  Rationale for Evaluation and Treatment: Rehabilitation  THERAPY DIAG:  Other low back pain  Muscle weakness (generalized)  Other abnormalities of gait and mobility  Other symptoms and signs involving the musculoskeletal system  ONSET DATE: about 6 weeks  SUBJECTIVE:  SUBJECTIVE STATEMENT: Patient states she does swimming and MD wanted her to build up bones up last year with PT. Now back has been painful. Pain began at least 6 weeks ago with insidious onset. Notes pain with walking, transfers, sitting. Patient stating pain in low back, bilateral hips, and hamstrings. Symptoms improve with more walking. Sitting for a long time 30-50 minutes and then when she gets up it hurts and so does after getting up in the morning.   PERTINENT HISTORY:  Hx cancer, osteopenia, HTN, 1974 back surgery   PAIN:  Are you having pain? No at rest  PRECAUTIONS: None  WEIGHT BEARING RESTRICTIONS: No  FALLS:  Has patient fallen in last 6 months? No   PLOF: Independent  PATIENT GOALS: eliminate the pain   OBJECTIVE: (objective measures from initial evaluation unless otherwise dated)   PATIENT SURVEYS:  FOTO 65% function  SCREENING FOR RED FLAGS: Bowel or bladder incontinence: No Spinal tumors: No Cauda equina syndrome: No Compression fracture: No Abdominal aneurysm: No  COGNITION: Overall cognitive status: Within functional limits for tasks assessed     SENSATION: WFL    PALPATION: No tenderness in lumbar spine. Tender at L greater Trochanter and R lateral hamstrings  LUMBAR ROM:   AROM eval  Flexion 25% limited (posterior LE "tight")  Extension 25% limited   Right lateral flexion 25%  limited   Left lateral flexion 25% limited   Right rotation   Left rotation    (Blank rows = not tested) * = pain/symptoms  LOWER EXTREMITY ROM:   decreased hip extension bilaterally  Active  Right eval Left eval  Hip flexion    Hip extension    Hip abduction    Hip adduction    Hip internal rotation    Hip external rotation    Knee flexion    Knee extension    Ankle dorsiflexion    Ankle plantarflexion    Ankle inversion    Ankle eversion     (Blank rows = not tested) * = pain/symptoms  LOWER EXTREMITY MMT:    MMT Right eval Left eval  Hip flexion 4+ 4+  Hip extension 3+ (ROM only to neutral) 3+ (ROM only to neutral)  Hip abduction 4- 4-  Hip adduction    Hip internal rotation    Hip external rotation    Knee flexion 5 5  Knee extension 5 5  Ankle dorsiflexion 5 5  Ankle plantarflexion    Ankle inversion    Ankle eversion     (Blank rows = not tested) * = pain/symptoms    FUNCTIONAL TESTS:  5 times sit to stand: 12.32 seconds without UE, support, dynamic knee valgus bilaterally, decreased eccentric control.   GAIT: Distance walked: 100 feet Assistive device utilized: None Level of assistance: Complete Independence Comments: WFL  TODAY'S TREATMENT:  DATE:  08/01/23  EVAL and HEP   PATIENT EDUCATION:  Education details: Patient educated on exam findings, POC, scope of PT, HEP. Person educated: Patient Education method: Explanation, Demonstration, and Handouts Education comprehension: verbalized understanding, returned demonstration, verbal cues required, and tactile cues required  HOME EXERCISE PROGRAM: Access Code: [email or web address] URL: [email or web address] Date: 08/01/2023  - Supine Bridge  - 1 x daily - 7 x weekly - 3 sets - 10 reps - Supine Hamstring Stretch  - 1 x daily - 7 x weekly - 5 reps - 20 second  hold  ASSESSMENT:  CLINICAL IMPRESSION: Patient a 88 y.o. y.o. female who was seen today for physical therapy evaluation and treatment for low back pain. Patient presents with pain limited deficits in lumbar spine strength, ROM, endurance, activity tolerance, and functional mobility with ADL. Patient is having to modify and restrict ADL as indicated by outcome measure score as well as subjective information and objective measures which is affecting overall participation. Patient will benefit from skilled physical therapy in order to improve function and reduce impairment.  OBJECTIVE IMPAIRMENTS: decreased activity tolerance, decreased balance, decreased mobility, difficulty walking, decreased ROM, decreased strength, improper body mechanics, and pain.   ACTIVITY LIMITATIONS: carrying, lifting, bending, sitting, standing, squatting, locomotion level, and caring for others  PARTICIPATION LIMITATIONS: meal prep, cleaning, laundry, shopping, and community activity  PERSONAL FACTORS: Age, Time since onset of injury/illness/exacerbation, and 3+ comorbidities: chronic back pain, Hx cancer, osteopenia, HTN  are also affecting patient's functional outcome.   REHAB POTENTIAL: Good  CLINICAL DECISION MAKING: Stable/uncomplicated  EVALUATION COMPLEXITY: Low   GOALS: Goals reviewed with patient? Yes  SHORT TERM GOALS: Target date: 08/29/2023    Patient will be independent with HEP in order to improve functional outcomes. Baseline:  Goal status: INITIAL  2.  Patient will report at least 25% improvement in symptoms for improved quality of life. Baseline:  Goal status: INITIAL    LONG TERM GOALS: Target date: 09/26/2023    Patient will report at least 75% improvement in symptoms for improved quality of life. Baseline:  Goal status: INITIAL  2.  Patient will improve FOTO score to predicted outcomes in order to indicate improved tolerance to activity. Baseline: 65% function Goal status:  INITIAL  3.  Patient will demonstrate at least 25% improvement in lumbar ROM in all restricted planes for improved ability to move trunk while completing chores. Baseline:  Goal status: INITIAL  4.  Patient will be able to complete 5x STS in under 11.4 seconds without compensation in order to reduce the risk of falls. Baseline: 12.32 seconds without UE, support, dynamic knee valgus bilaterally, decreased eccentric control. Goal status: INITIAL  5.  Patient will demonstrate grade of 4+/5 MMT grade in all tested musculature as evidence of improved strength to assist with stair ambulation and gait.   Baseline: see above Goal status: INITIAL     PLAN:  PT FREQUENCY: 1-2x/week  PT DURATION: 8 weeks  PLANNED INTERVENTIONS: 97164- PT Re-evaluation, 97110-Therapeutic exercises, 97530- Therapeutic activity, 97112- Neuromuscular re-education, 97535- Self Care, 30865- Manual therapy, [phone]- Gait training, [phone]- Orthotic Fit/training, [phone]- Canalith repositioning, [id number]- Aquatic Therapy, [phone]- Splinting, Patient/Family education, Balance training, Stair training, Taping, Dry Needling, Joint mobilization, Joint manipulation, Spinal manipulation, Spinal mobilization, Scar mobilization, and DME instructions.  PLAN FOR NEXT SESSION: f/u with HEP, core and glute strength, postural strength, lumbar, hip and hamstring mobility   Reola Mosher Onie Kasparek, PT 08/01/2023, 3:59 PM

## 2023-08-03 DIAGNOSIS — Z6824 Body mass index (BMI) 24.0-24.9, adult: Secondary | ICD-10-CM | POA: Diagnosis not present

## 2023-08-03 DIAGNOSIS — L409 Psoriasis, unspecified: Secondary | ICD-10-CM | POA: Diagnosis not present

## 2023-08-03 DIAGNOSIS — Z79899 Other long term (current) drug therapy: Secondary | ICD-10-CM | POA: Diagnosis not present

## 2023-08-03 DIAGNOSIS — M0579 Rheumatoid arthritis with rheumatoid factor of multiple sites without organ or systems involvement: Secondary | ICD-10-CM | POA: Diagnosis not present

## 2023-08-03 DIAGNOSIS — M1991 Primary osteoarthritis, unspecified site: Secondary | ICD-10-CM | POA: Diagnosis not present

## 2023-08-10 DIAGNOSIS — H401132 Primary open-angle glaucoma, bilateral, moderate stage: Secondary | ICD-10-CM | POA: Diagnosis not present

## 2023-08-10 DIAGNOSIS — M0570 Rheumatoid arthritis with rheumatoid factor of unspecified site without organ or systems involvement: Secondary | ICD-10-CM | POA: Diagnosis not present

## 2023-08-10 DIAGNOSIS — H35033 Hypertensive retinopathy, bilateral: Secondary | ICD-10-CM | POA: Diagnosis not present

## 2023-08-14 DIAGNOSIS — M51362 Other intervertebral disc degeneration, lumbar region with discogenic back pain and lower extremity pain: Secondary | ICD-10-CM | POA: Diagnosis not present

## 2023-08-16 ENCOUNTER — Encounter (HOSPITAL_BASED_OUTPATIENT_CLINIC_OR_DEPARTMENT_OTHER): Payer: Self-pay | Admitting: Physical Therapy

## 2023-08-16 ENCOUNTER — Ambulatory Visit (HOSPITAL_BASED_OUTPATIENT_CLINIC_OR_DEPARTMENT_OTHER): Payer: Medicare Other | Attending: Sports Medicine | Admitting: Physical Therapy

## 2023-08-16 DIAGNOSIS — M5459 Other low back pain: Secondary | ICD-10-CM | POA: Insufficient documentation

## 2023-08-16 DIAGNOSIS — R2689 Other abnormalities of gait and mobility: Secondary | ICD-10-CM | POA: Diagnosis not present

## 2023-08-16 DIAGNOSIS — M6281 Muscle weakness (generalized): Secondary | ICD-10-CM | POA: Insufficient documentation

## 2023-08-16 DIAGNOSIS — R29898 Other symptoms and signs involving the musculoskeletal system: Secondary | ICD-10-CM | POA: Insufficient documentation

## 2023-08-16 NOTE — Therapy (Signed)
 OUTPATIENT PHYSICAL THERAPY THORACOLUMBAR TREATMENT   Patient Name: Christina Henderson MRN: [id number] DOB:1936/05/19, 88 y.o., female Today's Date: 08/16/2023  END OF SESSION:  PT End of Session - 08/16/23 1017     Visit Number 2    Number of Visits 16    Date for PT Re-Evaluation 09/26/23    Authorization Type Medicare    Progress Note Due on Visit 10    PT Start Time 1017    PT Stop Time 1055    PT Time Calculation (min) 38 min    Behavior During Therapy [facility] for tasks assessed/performed             Past Medical History:  Diagnosis Date   Arthritis    rhuematoid arthritis   Breast cancer (HCC) 2018   Right Breast Cancer   Cancer (HCC) 03/2017   right breast cancer   Complication of anesthesia    hard to wake up   GERD (gastroesophageal reflux disease)    food related   Glaucoma    Hypertension    Osteoporosis    osteopenia   Rosacea    Past Surgical History:  Procedure Laterality Date   APPENDECTOMY     BACK SURGERY     BREAST BIOPSY     BREAST LUMPECTOMY Right 04/2017   BREAST LUMPECTOMY WITH RADIOACTIVE SEED AND SENTINEL LYMPH NODE BIOPSY Bilateral 05/09/2017   Procedure: BILATERAL RADIOACTIVE SEED GUIDED LUMPECTOMIES WITH RIGHT SENTINEL LYMPH NODE BIOPSY;  Surgeon: Vanderbilt Ned, MD;  Location: [facility];  Service: General;  Laterality: Bilateral;   EYE SURGERY     cataract   SPINE SURGERY     lumbar lamenectomy   TONSILLECTOMY     Patient Active Problem List   Diagnosis Date Noted   SOB (shortness of breath) 02/15/2023   Bradycardia 08/15/2022   Dyslipidemia 11/25/2019   Educated about COVID-19 virus infection 11/25/2019   Chest pain 07/17/2018   Cough 07/17/2018   Essential hypertension 07/17/2018   Malignant neoplasm of upper-outer quadrant of right breast in female, estrogen receptor positive (HCC) 05/23/2017    PCP: Sari Pay MD  REFERRING PROVIDER: Dasie Fitch, MD  REFERRING DIAG: M54.50 (ICD-10-CM) - Low  back pain, unspecified; bilateral hip pain  Rationale for Evaluation and Treatment: Rehabilitation  THERAPY DIAG:  Other low back pain  Muscle weakness (generalized)  Other abnormalities of gait and mobility  ONSET DATE: about 6 weeks  SUBJECTIVE:  SUBJECTIVE STATEMENT: I was in agony when I did the exercise I was given (bridges).   She completed the exercises daily, despite the pain increasing during/ after completion.  Pt reports she is currently swimming (side stroke) 45 min x 5 days per week.    From initial evaluation:  Patient states she does swimming and MD wanted her to build up bones up last year with PT. Now back has been painful. Pain began at least 6 weeks ago with insidious onset. Notes pain with walking, transfers, sitting. Patient stating pain in low back, bilateral hips, and hamstrings. Symptoms improve with more walking. Sitting for a long time 30-50 minutes and then when she gets up it hurts and so does after getting up in the morning.   PERTINENT HISTORY:  Hx cancer, osteopenia, HTN, 1974 back surgery   PAIN:  Are you having pain? No at rest  PRECAUTIONS: None  WEIGHT BEARING RESTRICTIONS: No  FALLS:  Has patient fallen in last 6 months? No   PLOF: Independent  PATIENT GOALS: eliminate the pain   OBJECTIVE: (objective measures from initial evaluation unless otherwise dated)   PATIENT SURVEYS:  FOTO 65% function  SCREENING FOR RED FLAGS: Bowel or bladder incontinence: No Spinal tumors: No Cauda equina syndrome: No Compression fracture: No Abdominal aneurysm: No  COGNITION: Overall cognitive status: Within functional limits for tasks assessed     SENSATION: WFL    PALPATION: No tenderness in lumbar spine. Tender at L greater Trochanter and R lateral  hamstrings  LUMBAR ROM:   AROM eval  Flexion 25% limited (posterior LE tight)  Extension 25% limited   Right lateral flexion 25% limited   Left lateral flexion 25% limited   Right rotation   Left rotation    (Blank rows = not tested) * = pain/symptoms  LOWER EXTREMITY ROM:   decreased hip extension bilaterally  Active  Right eval Left eval  Hip flexion    Hip extension    Hip abduction    Hip adduction    Hip internal rotation    Hip external rotation    Knee flexion    Knee extension    Ankle dorsiflexion    Ankle plantarflexion    Ankle inversion    Ankle eversion     (Blank rows = not tested) * = pain/symptoms  LOWER EXTREMITY MMT:    MMT Right eval Left eval  Hip flexion 4+ 4+  Hip extension 3+ (ROM only to neutral) 3+ (ROM only to neutral)  Hip abduction 4- 4-  Hip adduction    Hip internal rotation    Hip external rotation    Knee flexion 5 5  Knee extension 5 5  Ankle dorsiflexion 5 5  Ankle plantarflexion    Ankle inversion    Ankle eversion     (Blank rows = not tested) * = pain/symptoms    FUNCTIONAL TESTS:  5 times sit to stand: 12.32 seconds without UE, support, dynamic knee valgus bilaterally, decreased eccentric control.   GAIT: Distance walked: 100 feet Assistive device utilized: None Level of assistance: Complete Independence Comments: WFL  TODAY'S TREATMENT:  DATE:  08/15/22 NuStep L4: UE/LE x 5 min for warm up Seated hamstring stretch x15s x 2 each LE Seated piriformis stretch x 15s x 2 each LE Bridge - painful - stopped after 2 Standing resisted shoulder ext with core engaged, red band x 15;  resisted bilat row with red band x 15 STS with cues for forward arm reach, hip hinge, core engaged 2 x 5 Trunk ext (limited tolerance past neutral)    PATIENT EDUCATION:  Education details: reviewed and modified  HEP. Person educated: Patient Education method: Explanation, Demonstration,  Education comprehension: verbalized understanding, returned demonstration, verbal cues required, and tactile cues required  HOME EXERCISE PROGRAM: Access Code: [email or web address] URL: [email or web address] Date: 08/01/2023  - Supine Hamstring Stretch  - 1 x daily - 7 x weekly - 5 reps - 20 second hold - Sit to/from Stand x5, 2x/day  ASSESSMENT:  CLINICAL IMPRESSION: Pt continues to report increase in pain in posterior hips and hamstrings when ascending during sit to stand and upon standing.  Increased pain across lower back with bridge; pt told to hold on this exercise for now.  She reported feeling it in her buttocks with resisted shoulder ext with core engaged. She is a good candidate for aquatic therapy and will benefit from the properties of water to progress towards land based goals. Will trial aquatic exercise next visit to build HEP with some water exercises. Goals are ongoing.     From initial evaluation:  Patient a 88 y.o. y.o. female who was seen today for physical therapy evaluation and treatment for low back pain. Patient presents with pain limited deficits in lumbar spine strength, ROM, endurance, activity tolerance, and functional mobility with ADL. Patient is having to modify and restrict ADL as indicated by outcome measure score as well as subjective information and objective measures which is affecting overall participation. Patient will benefit from skilled physical therapy in order to improve function and reduce impairment.  OBJECTIVE IMPAIRMENTS: decreased activity tolerance, decreased balance, decreased mobility, difficulty walking, decreased ROM, decreased strength, improper body mechanics, and pain.   ACTIVITY LIMITATIONS: carrying, lifting, bending, sitting, standing, squatting, locomotion level, and caring for others  PARTICIPATION LIMITATIONS: meal prep, cleaning, laundry, shopping,  and community activity  PERSONAL FACTORS: Age, Time since onset of injury/illness/exacerbation, and 3+ comorbidities: chronic back pain, Hx cancer, osteopenia, HTN  are also affecting patient's functional outcome.   REHAB POTENTIAL: Good  CLINICAL DECISION MAKING: Stable/uncomplicated  EVALUATION COMPLEXITY: Low   GOALS: Goals reviewed with patient? Yes  SHORT TERM GOALS: Target date: 08/29/2023    Patient will be independent with HEP in order to improve functional outcomes. Baseline:  Goal status: INITIAL  2.  Patient will report at least 25% improvement in symptoms for improved quality of life. Baseline:  Goal status: INITIAL    LONG TERM GOALS: Target date: 09/26/2023    Patient will report at least 75% improvement in symptoms for improved quality of life. Baseline:  Goal status: INITIAL  2.  Patient will improve FOTO score to predicted outcomes in order to indicate improved tolerance to activity. Baseline: 65% function Goal status: INITIAL  3.  Patient will demonstrate at least 25% improvement in lumbar ROM in all restricted planes for improved ability to move trunk while completing chores. Baseline:  Goal status: INITIAL  4.  Patient will be able to complete 5x STS in under 11.4 seconds without compensation in order to reduce the risk of falls. Baseline: 12.32 seconds without UE, support, dynamic knee valgus  bilaterally, decreased eccentric control. Goal status: INITIAL  5.  Patient will demonstrate grade of 4+/5 MMT grade in all tested musculature as evidence of improved strength to assist with stair ambulation and gait.   Baseline: see above Goal status: INITIAL     PLAN:  PT FREQUENCY: 1-2x/week  PT DURATION: 8 weeks  PLANNED INTERVENTIONS: 97164- PT Re-evaluation, 97110-Therapeutic exercises, 97530- Therapeutic activity, 97112- Neuromuscular re-education, 97535- Self Care, 02859- Manual therapy, [phone]- Gait training, [phone]- Orthotic Fit/training,  [phone]- Canalith repositioning, [id number]- Aquatic Therapy, [phone]- Splinting, Patient/Family education, Balance training, Stair training, Taping, Dry Needling, Joint mobilization, Joint manipulation, Spinal manipulation, Spinal mobilization, Scar mobilization, and DME instructions.  PLAN FOR NEXT SESSION: f/u with HEP, core and glute strength, postural strength, lumbar, hip and hamstring mobility  Delon Aquas, PTA 08/16/23 12:25 PM [facility] [address] [place], [place], [address] Phone: [phone]   Fax:  [phone]

## 2023-08-18 ENCOUNTER — Ambulatory Visit (HOSPITAL_BASED_OUTPATIENT_CLINIC_OR_DEPARTMENT_OTHER): Payer: Medicare Other | Admitting: Physical Therapy

## 2023-08-18 ENCOUNTER — Encounter (HOSPITAL_BASED_OUTPATIENT_CLINIC_OR_DEPARTMENT_OTHER): Payer: Self-pay | Admitting: Physical Therapy

## 2023-08-18 DIAGNOSIS — M6281 Muscle weakness (generalized): Secondary | ICD-10-CM | POA: Diagnosis not present

## 2023-08-18 DIAGNOSIS — R29898 Other symptoms and signs involving the musculoskeletal system: Secondary | ICD-10-CM | POA: Diagnosis not present

## 2023-08-18 DIAGNOSIS — R2689 Other abnormalities of gait and mobility: Secondary | ICD-10-CM | POA: Diagnosis not present

## 2023-08-18 DIAGNOSIS — M5459 Other low back pain: Secondary | ICD-10-CM | POA: Diagnosis not present

## 2023-08-18 NOTE — Therapy (Signed)
 OUTPATIENT PHYSICAL THERAPY THORACOLUMBAR TREATMENT   Patient Name: Christina Henderson MRN: [id number] DOB:Aug 10, 1935, 88 y.o., female Today's Date: 08/18/2023  END OF SESSION:  PT End of Session - 08/18/23 0928     Visit Number 3    Number of Visits 16    Date for PT Re-Evaluation 09/26/23    Authorization Type Medicare    Progress Note Due on Visit 10    PT Start Time 0920    PT Stop Time 1000    PT Time Calculation (min) 40 min    Activity Tolerance Patient tolerated treatment well    Behavior During Therapy [facility] for tasks assessed/performed             Past Medical History:  Diagnosis Date   Arthritis    rhuematoid arthritis   Breast cancer (HCC) 2018   Right Breast Cancer   Cancer (HCC) 03/2017   right breast cancer   Complication of anesthesia    hard to wake up   GERD (gastroesophageal reflux disease)    food related   Glaucoma    Hypertension    Osteoporosis    osteopenia   Rosacea    Past Surgical History:  Procedure Laterality Date   APPENDECTOMY     BACK SURGERY     BREAST BIOPSY     BREAST LUMPECTOMY Right 04/2017   BREAST LUMPECTOMY WITH RADIOACTIVE SEED AND SENTINEL LYMPH NODE BIOPSY Bilateral 05/09/2017   Procedure: BILATERAL RADIOACTIVE SEED GUIDED LUMPECTOMIES WITH RIGHT SENTINEL LYMPH NODE BIOPSY;  Surgeon: Vanderbilt Ned, MD;  Location: [facility];  Service: General;  Laterality: Bilateral;   EYE SURGERY     cataract   SPINE SURGERY     lumbar lamenectomy   TONSILLECTOMY     Patient Active Problem List   Diagnosis Date Noted   SOB (shortness of breath) 02/15/2023   Bradycardia 08/15/2022   Dyslipidemia 11/25/2019   Educated about COVID-19 virus infection 11/25/2019   Chest pain 07/17/2018   Cough 07/17/2018   Essential hypertension 07/17/2018   Malignant neoplasm of upper-outer quadrant of right breast in female, estrogen receptor positive (HCC) 05/23/2017    PCP: Sari Pay MD  REFERRING PROVIDER: Dasie Fitch, MD  REFERRING DIAG: M54.50 (ICD-10-CM) - Low back pain, unspecified; bilateral hip pain  Rationale for Evaluation and Treatment: Rehabilitation  THERAPY DIAG:  Other low back pain  Muscle weakness (generalized)  Other abnormalities of gait and mobility  ONSET DATE: about 6 weeks  SUBJECTIVE:  SUBJECTIVE STATEMENT: Pt reports improved tolerance for HEP with modifications.  Didn't sleep well last night.    From initial evaluation:  Patient states she does swimming and MD wanted her to build up bones up last year with PT. Now back has been painful. Pain began at least 6 weeks ago with insidious onset. Notes pain with walking, transfers, sitting. Patient stating pain in low back, bilateral hips, and hamstrings. Symptoms improve with more walking. Sitting for a long time 30-50 minutes and then when she gets up it hurts and so does after getting up in the morning.   PERTINENT HISTORY:  Hx cancer, osteopenia, HTN, 1974 back surgery   PAIN:  Are you having pain? No at rest  PRECAUTIONS: None  WEIGHT BEARING RESTRICTIONS: No  FALLS:  Has patient fallen in last 6 months? No   PLOF: Independent  PATIENT GOALS: eliminate the pain   OBJECTIVE: (objective measures from initial evaluation unless otherwise dated)   PATIENT SURVEYS:  FOTO 65% function  SCREENING FOR RED FLAGS: Bowel or bladder incontinence: No Spinal tumors: No Cauda equina syndrome: No Compression fracture: No Abdominal aneurysm: No  COGNITION: Overall cognitive status: Within functional limits for tasks assessed     SENSATION: WFL    PALPATION: No tenderness in lumbar spine. Tender at L greater Trochanter and R lateral hamstrings  LUMBAR ROM:   AROM eval  Flexion 25% limited (posterior LE tight)   Extension 25% limited   Right lateral flexion 25% limited   Left lateral flexion 25% limited   Right rotation   Left rotation    (Blank rows = not tested) * = pain/symptoms  LOWER EXTREMITY ROM:   decreased hip extension bilaterally  Active  Right eval Left eval  Hip flexion    Hip extension    Hip abduction    Hip adduction    Hip internal rotation    Hip external rotation    Knee flexion    Knee extension    Ankle dorsiflexion    Ankle plantarflexion    Ankle inversion    Ankle eversion     (Blank rows = not tested) * = pain/symptoms  LOWER EXTREMITY MMT:    MMT Right eval Left eval  Hip flexion 4+ 4+  Hip extension 3+ (ROM only to neutral) 3+ (ROM only to neutral)  Hip abduction 4- 4-  Hip adduction    Hip internal rotation    Hip external rotation    Knee flexion 5 5  Knee extension 5 5  Ankle dorsiflexion 5 5  Ankle plantarflexion    Ankle inversion    Ankle eversion     (Blank rows = not tested) * = pain/symptoms    FUNCTIONAL TESTS:  5 times sit to stand: 12.32 seconds without UE, support, dynamic knee valgus bilaterally, decreased eccentric control.   GAIT: Distance walked: 100 feet Assistive device utilized: None Level of assistance: Complete Independence Comments: WFL  TODAY'S TREATMENT:  DATE:  08/18/23 Pt seen for aquatic therapy today.  Treatment took place in water 3.5-4.75 ft in depth at the [facility] pool. Temp of water was 91.  Pt entered/exited the pool via stairs independently with bilat rail. - unsupported walking forward/ backward with cues for vertical trunk - farmer carry with bilat rainbow hand floats, walking forward/ backward -> single yellow  - marching with reciprocal arm motion with rainbow hand floats - UE on rainbow hand floats 3 way LE kicks - cues to slow speed and engage core x 10 each  LE - UE on yellow hand floats, 3 way toe taps x 5 each LE; heel raises x 12 - TrA set with short hollow noodle pull downs to thighs x 8 -> single rainbow hand floats x 10 - UE on wall: hip abdct/ addct x 10 each;  leg swings into hip flexion/ext x 10 each - straddling noodle and UE on corner: cycling, hip abdct/ addct and cross country ski  Pt requires the buoyancy and hydrostatic pressure of water for support, and to offload joints by unweighting joint load by at least 50 % in navel deep water and by at least 75-80% in chest to neck deep water.  Viscosity of the water is needed for resistance of strengthening. Water current perturbations provides challenge to standing balance requiring increased core activation.   08/16/23 NuStep L4: UE/LE x 5 min for warm up Seated hamstring stretch x15s x 2 each LE Seated piriformis stretch x 15s x 2 each LE Bridge - painful - stopped after 2 Standing resisted shoulder ext with core engaged, red band x 15;  resisted bilat row with red band x 15 STS with cues for forward arm reach, hip hinge, core engaged 2 x 5 Trunk ext (limited tolerance past neutral)    PATIENT EDUCATION:  Education details: intro to aquatic therapy  Person educated: Patient Education method: Programmer, Multimedia, Demonstration,  Education comprehension: verbalized understanding, returned demonstration, verbal cues required, and tactile cues required  HOME EXERCISE PROGRAM: Access Code: [email or web address] URL: [email or web address] Date: 08/01/2023  - Supine Hamstring Stretch  - 1 x daily - 7 x weekly - 5 reps - 20 second hold - Sit to/from Stand x5, 2x/day - standing bilat shoulder ext with red band x 15;     AQUATIC Access Code: [email or web address] URL: [email or web address] Date: 08/18/2023  * not issued yet Prepared by: [facility]     ASSESSMENT:  CLINICAL IMPRESSION: Pt demonstrates safety and independence in aquatic setting with  therapist instructing from deck. Pt demonstrates confidence in setting, moving throughout all depths easily.  Pt is directed through various movement patterns and trials  in standing. She reported some increase in pain (1/10) in L SI joint area with L SLS with minimal UE support for dynamic RLE movement. She is challenged with balance in SLS without support from wall.    Will plan to issue laminated aquatic HEP next visit for her to begin using at her local pool - for work on core, LE and back strengthening and balance.  Goals are ongoing.      From initial evaluation:  Patient a 88 y.o. y.o. female who was seen today for physical therapy evaluation and treatment for low back pain. Patient presents with pain limited deficits in lumbar spine strength, ROM, endurance, activity tolerance, and functional mobility with ADL. Patient is having to modify and restrict ADL as indicated by outcome measure score as well as subjective information and  objective measures which is affecting overall participation. Patient will benefit from skilled physical therapy in order to improve function and reduce impairment.  OBJECTIVE IMPAIRMENTS: decreased activity tolerance, decreased balance, decreased mobility, difficulty walking, decreased ROM, decreased strength, improper body mechanics, and pain.   ACTIVITY LIMITATIONS: carrying, lifting, bending, sitting, standing, squatting, locomotion level, and caring for others  PARTICIPATION LIMITATIONS: meal prep, cleaning, laundry, shopping, and community activity  PERSONAL FACTORS: Age, Time since onset of injury/illness/exacerbation, and 3+ comorbidities: chronic back pain, Hx cancer, osteopenia, HTN  are also affecting patient's functional outcome.   REHAB POTENTIAL: Good  CLINICAL DECISION MAKING: Stable/uncomplicated  EVALUATION COMPLEXITY: Low   GOALS: Goals reviewed with patient? Yes  SHORT TERM GOALS: Target date: 08/29/2023    Patient will be independent  with HEP in order to improve functional outcomes. Baseline:  Goal status: INITIAL  2.  Patient will report at least 25% improvement in symptoms for improved quality of life. Baseline:  Goal status: INITIAL    LONG TERM GOALS: Target date: 09/26/2023    Patient will report at least 75% improvement in symptoms for improved quality of life. Baseline:  Goal status: INITIAL  2.  Patient will improve FOTO score to predicted outcomes in order to indicate improved tolerance to activity. Baseline: 65% function Goal status: INITIAL  3.  Patient will demonstrate at least 25% improvement in lumbar ROM in all restricted planes for improved ability to move trunk while completing chores. Baseline:  Goal status: INITIAL  4.  Patient will be able to complete 5x STS in under 11.4 seconds without compensation in order to reduce the risk of falls. Baseline: 12.32 seconds without UE, support, dynamic knee valgus bilaterally, decreased eccentric control. Goal status: INITIAL  5.  Patient will demonstrate grade of 4+/5 MMT grade in all tested musculature as evidence of improved strength to assist with stair ambulation and gait.   Baseline: see above Goal status: INITIAL     PLAN:  PT FREQUENCY: 1-2x/week  PT DURATION: 8 weeks  PLANNED INTERVENTIONS: 97164- PT Re-evaluation, 97110-Therapeutic exercises, 97530- Therapeutic activity, 97112- Neuromuscular re-education, 97535- Self Care, [id number]- Manual therapy, [phone]- Gait training, [phone]- Orthotic Fit/training, [phone]- Canalith repositioning, [id number]- Aquatic Therapy, [phone]- Splinting, Patient/Family education, Balance training, Stair training, Taping, Dry Needling, Joint mobilization, Joint manipulation, Spinal manipulation, Spinal mobilization, Scar mobilization, and DME instructions.  PLAN FOR NEXT SESSION: f/u with HEP, core and glute strength, postural strength, lumbar, hip and hamstring mobility  Delon Aquas, PTA 08/18/23 10:13  AM [facility] [address] [place], [place], [address] Phone: [phone]   Fax:  [phone]

## 2023-08-21 ENCOUNTER — Ambulatory Visit (HOSPITAL_BASED_OUTPATIENT_CLINIC_OR_DEPARTMENT_OTHER): Payer: Medicare Other | Admitting: Physical Therapy

## 2023-08-21 ENCOUNTER — Encounter (HOSPITAL_BASED_OUTPATIENT_CLINIC_OR_DEPARTMENT_OTHER): Payer: Self-pay | Admitting: Physical Therapy

## 2023-08-21 DIAGNOSIS — M6281 Muscle weakness (generalized): Secondary | ICD-10-CM | POA: Diagnosis not present

## 2023-08-21 DIAGNOSIS — M5459 Other low back pain: Secondary | ICD-10-CM

## 2023-08-21 DIAGNOSIS — R2689 Other abnormalities of gait and mobility: Secondary | ICD-10-CM

## 2023-08-21 DIAGNOSIS — R29898 Other symptoms and signs involving the musculoskeletal system: Secondary | ICD-10-CM | POA: Diagnosis not present

## 2023-08-21 NOTE — Therapy (Signed)
 OUTPATIENT PHYSICAL THERAPY THORACOLUMBAR TREATMENT   Patient Name: Christina Henderson MRN: [id number] DOB:1936-05-23, 88 y.o., female Today's Date: 08/21/2023  END OF SESSION:  PT End of Session - 08/21/23 1021     Visit Number 4    Number of Visits 16    Date for PT Re-Evaluation 09/26/23    Authorization Type Medicare    Progress Note Due on Visit 10    PT Start Time 1018    PT Stop Time 1057    PT Time Calculation (min) 39 min    Behavior During Therapy [facility] for tasks assessed/performed             Past Medical History:  Diagnosis Date   Arthritis    rhuematoid arthritis   Breast cancer (HCC) 2018   Right Breast Cancer   Cancer (HCC) 03/2017   right breast cancer   Complication of anesthesia    hard to wake up   GERD (gastroesophageal reflux disease)    food related   Glaucoma    Hypertension    Osteoporosis    osteopenia   Rosacea    Past Surgical History:  Procedure Laterality Date   APPENDECTOMY     BACK SURGERY     BREAST BIOPSY     BREAST LUMPECTOMY Right 04/2017   BREAST LUMPECTOMY WITH RADIOACTIVE SEED AND SENTINEL LYMPH NODE BIOPSY Bilateral 05/09/2017   Procedure: BILATERAL RADIOACTIVE SEED GUIDED LUMPECTOMIES WITH RIGHT SENTINEL LYMPH NODE BIOPSY;  Surgeon: Sim Dryer, MD;  Location: [facility];  Service: General;  Laterality: Bilateral;   EYE SURGERY     cataract   SPINE SURGERY     lumbar lamenectomy   TONSILLECTOMY     Patient Active Problem List   Diagnosis Date Noted   SOB (shortness of breath) 02/15/2023   Bradycardia 08/15/2022   Dyslipidemia 11/25/2019   Educated about COVID-19 virus infection 11/25/2019   Chest pain 07/17/2018   Cough 07/17/2018   Essential hypertension 07/17/2018   Malignant neoplasm of upper-outer quadrant of right breast in female, estrogen receptor positive (HCC) 05/23/2017    PCP: Helyn Lobstein MD  REFERRING PROVIDER: Rance Burrows, MD  REFERRING DIAG: M54.50 (ICD-10-CM) -  Low back pain, unspecified; bilateral hip pain  Rationale for Evaluation and Treatment: Rehabilitation  THERAPY DIAG:  Other low back pain  Muscle weakness (generalized)  Other abnormalities of gait and mobility  ONSET DATE: about 6 weeks  SUBJECTIVE:  SUBJECTIVE STATEMENT: "I felt great Sat and Sunday".  Pt reports she did some forward/backward walking in pool after swimming Saturday, "I think it helped".   Didn't sleep well last night.    From initial evaluation:  Patient states she does swimming and MD wanted her to build up bones up last year with PT. Now back has been painful. Pain began at least 6 weeks ago with insidious onset. Notes pain with walking, transfers, sitting. Patient stating pain in low back, bilateral hips, and hamstrings. Symptoms improve with more walking. Sitting for a long time 30-50 minutes and then when she gets up it hurts and so does after getting up in the morning.   PERTINENT HISTORY:  Hx cancer, osteopenia, HTN, 1974 back surgery   PAIN:  Are you having pain? Yes Location: bilat buttocks and upper hamstrings NPRS: 4/10   PRECAUTIONS: None  WEIGHT BEARING RESTRICTIONS: No  FALLS:  Has patient fallen in last 6 months? No   PLOF: Independent  PATIENT GOALS: eliminate the pain   OBJECTIVE: (objective measures from initial evaluation unless otherwise dated)   PATIENT SURVEYS:  FOTO 65% function  SCREENING FOR RED FLAGS: Bowel or bladder incontinence: No Spinal tumors: No Cauda equina syndrome: No Compression fracture: No Abdominal aneurysm: No  COGNITION: Overall cognitive status: Within functional limits for tasks assessed     SENSATION: WFL    PALPATION: No tenderness in lumbar spine. Tender at L greater Trochanter and R lateral  hamstrings  LUMBAR ROM:   AROM eval  Flexion 25% limited (posterior LE "tight")  Extension 25% limited   Right lateral flexion 25% limited   Left lateral flexion 25% limited   Right rotation   Left rotation    (Blank rows = not tested) * = pain/symptoms  LOWER EXTREMITY ROM:   decreased hip extension bilaterally  Active  Right eval Left eval  Hip flexion    Hip extension    Hip abduction    Hip adduction    Hip internal rotation    Hip external rotation    Knee flexion    Knee extension    Ankle dorsiflexion    Ankle plantarflexion    Ankle inversion    Ankle eversion     (Blank rows = not tested) * = pain/symptoms  LOWER EXTREMITY MMT:    MMT Right eval Left eval  Hip flexion 4+ 4+  Hip extension 3+ (ROM only to neutral) 3+ (ROM only to neutral)  Hip abduction 4- 4-  Hip adduction    Hip internal rotation    Hip external rotation    Knee flexion 5 5  Knee extension 5 5  Ankle dorsiflexion 5 5  Ankle plantarflexion    Ankle inversion    Ankle eversion     (Blank rows = not tested) * = pain/symptoms    FUNCTIONAL TESTS:  5 times sit to stand: 12.32 seconds without UE, support, dynamic knee valgus bilaterally, decreased eccentric control.   GAIT: Distance walked: 100 feet Assistive device utilized: None Level of assistance: Complete Independence Comments: WFL  TODAY'S TREATMENT:  DATE:  08/21/23 Pt seen for aquatic therapy today.  Treatment took place in water 3.5-4.75 ft in depth at the [facility] pool. Temp of water was 91.  Pt entered/exited the pool via stairs independently with bilat rail. - unsupported walking forward/ backward with cues for vertical trunk -Side stepping with arm addct/ abdct with rainbow hand floats - farmer carry with bilat rainbow hand floats, walking forward/ backward -> single yellow  -  marching with reciprocal arm motion with rainbow hand floats - UE on yellow hand floats hip abdct/ addct x 10 each;  leg swings into hip flexion/ext x 10 each - cues to slow speed and engage core x 10 each LE - UE on wall: hip abdct/ addct x 10 each;  leg swings into hip flexion/ext x 10 each; single leg clams x 10 each  - TrA set with single rainbow hand floats pull downs to thighs x 10 - squats pulling single yellow hand float under water x 10 - straddling noodle and UE on corner: cycling, hip abdct/ addct and cross country ski - 3 way LE stretch with hollow blue noodle (ITB, hamstrings, adductor stretches) at ankle x 15s each position   08/18/23 Pt seen for aquatic therapy today.  Treatment took place in water 3.5-4.75 ft in depth at the [facility] pool. Temp of water was 91.  Pt entered/exited the pool via stairs independently with bilat rail. - unsupported walking forward/ backward with cues for vertical trunk - farmer carry with bilat rainbow hand floats, walking forward/ backward -> single yellow  - marching with reciprocal arm motion with rainbow hand floats - UE on rainbow hand floats 3 way LE kicks - cues to slow speed and engage core x 10 each LE - UE on yellow hand floats, 3 way toe taps x 5 each LE; heel raises x 12 - TrA set with short hollow noodle pull downs to thighs x 8 -> single rainbow hand floats x 10 - UE on wall: hip abdct/ addct x 10 each;  leg swings into hip flexion/ext x 10 each - straddling noodle and UE on corner: cycling, hip abdct/ addct and cross country ski   08/16/23 NuStep L4: UE/LE x 5 min for warm up Seated hamstring stretch x15s x 2 each LE Seated piriformis stretch x 15s x 2 each LE Bridge - painful - stopped after 2 Standing resisted shoulder ext with core engaged, red band x 15;  resisted bilat row with red band x 15 STS with cues for forward arm reach, hip hinge, core engaged 2 x 5 Trunk ext (limited tolerance past neutral)    PATIENT  EDUCATION:  Education details: intro to aquatic therapy  Person educated: Patient Education method: Programmer, multimedia, Demonstration,  Education comprehension: verbalized understanding, returned demonstration, verbal cues required, and tactile cues required  HOME EXERCISE PROGRAM: Access Code: [email or web address] URL: [email or web address] Date: 08/01/2023  - Supine Hamstring Stretch  - 1 x daily - 7 x weekly - 5 reps - 20 second hold - Sit to/from Stand x5, 2x/day - standing bilat shoulder ext with red band x 15;     AQUATIC Access Code: [email or web address] URL: [email or web address] Date: 02/010/2025   Prepared by: [facility] This aquatic home exercise program from [employer] utilizes pictures from land based exercises, but has been adapted prior to lamination and issuance.   ASSESSMENT:  CLINICAL IMPRESSION: Positive response to first aquatic session.  Pt reported some increase in pain today in L SI  joint area with leg swings and single leg clams; reduced with return to walking forward/ backward.  Issued laminated aquatic HEP for her to begin using at her local pool - for work on core, LE and back strengthening and balance. Pt to return to pool next week for progression/modification of aquatic HEP.   Goals are ongoing.    From initial evaluation:  Patient a 88 y.o. y.o. female who was seen today for physical therapy evaluation and treatment for low back pain. Patient presents with pain limited deficits in lumbar spine strength, ROM, endurance, activity tolerance, and functional mobility with ADL. Patient is having to modify and restrict ADL as indicated by outcome measure score as well as subjective information and objective measures which is affecting overall participation. Patient will benefit from skilled physical therapy in order to improve function and reduce impairment.  OBJECTIVE IMPAIRMENTS: decreased activity tolerance, decreased balance,  decreased mobility, difficulty walking, decreased ROM, decreased strength, improper body mechanics, and pain.   ACTIVITY LIMITATIONS: carrying, lifting, bending, sitting, standing, squatting, locomotion level, and caring for others  PARTICIPATION LIMITATIONS: meal prep, cleaning, laundry, shopping, and community activity  PERSONAL FACTORS: Age, Time since onset of injury/illness/exacerbation, and 3+ comorbidities: chronic back pain, Hx cancer, osteopenia, HTN  are also affecting patient's functional outcome.   REHAB POTENTIAL: Good  CLINICAL DECISION MAKING: Stable/uncomplicated  EVALUATION COMPLEXITY: Low   GOALS: Goals reviewed with patient? Yes  SHORT TERM GOALS: Target date: 08/29/2023    Patient will be independent with HEP in order to improve functional outcomes. Baseline:  Goal status: INITIAL  2.  Patient will report at least 25% improvement in symptoms for improved quality of life. Baseline:  Goal status: INITIAL    LONG TERM GOALS: Target date: 09/26/2023    Patient will report at least 75% improvement in symptoms for improved quality of life. Baseline:  Goal status: INITIAL  2.  Patient will improve FOTO score to predicted outcomes in order to indicate improved tolerance to activity. Baseline: 65% function Goal status: INITIAL  3.  Patient will demonstrate at least 25% improvement in lumbar ROM in all restricted planes for improved ability to move trunk while completing chores. Baseline:  Goal status: INITIAL  4.  Patient will be able to complete 5x STS in under 11.4 seconds without compensation in order to reduce the risk of falls. Baseline: 12.32 seconds without UE, support, dynamic knee valgus bilaterally, decreased eccentric control. Goal status: INITIAL  5.  Patient will demonstrate grade of 4+/5 MMT grade in all tested musculature as evidence of improved strength to assist with stair ambulation and gait.   Baseline: see above Goal status:  INITIAL     PLAN:  PT FREQUENCY: 1-2x/week  PT DURATION: 8 weeks  PLANNED INTERVENTIONS: 97164- PT Re-evaluation, 97110-Therapeutic exercises, 97530- Therapeutic activity, 97112- Neuromuscular re-education, 97535- Self Care, [id number]- Manual therapy, [phone]- Gait training, [phone]- Orthotic Fit/training, [phone]- Canalith repositioning, [id number]- Aquatic Therapy, [phone]- Splinting, Patient/Family education, Balance training, Stair training, Taping, Dry Needling, Joint mobilization, Joint manipulation, Spinal manipulation, Spinal mobilization, Scar mobilization, and DME instructions.  PLAN FOR NEXT SESSION: f/u with HEP, core and glute strength, postural strength, lumbar, hip and hamstring mobility  Almedia Jacobsen, PTA 08/21/23 11:21 AM [facility] [address] [place], [place], [address] Phone: [phone]   Fax:  [phone]

## 2023-08-23 ENCOUNTER — Encounter (HOSPITAL_BASED_OUTPATIENT_CLINIC_OR_DEPARTMENT_OTHER): Payer: Medicare Other | Admitting: Physical Therapy

## 2023-08-23 DIAGNOSIS — M5440 Lumbago with sciatica, unspecified side: Secondary | ICD-10-CM | POA: Diagnosis not present

## 2023-08-23 DIAGNOSIS — M5416 Radiculopathy, lumbar region: Secondary | ICD-10-CM | POA: Diagnosis not present

## 2023-08-23 DIAGNOSIS — G8929 Other chronic pain: Secondary | ICD-10-CM | POA: Diagnosis not present

## 2023-08-25 ENCOUNTER — Ambulatory Visit (HOSPITAL_BASED_OUTPATIENT_CLINIC_OR_DEPARTMENT_OTHER): Payer: Medicare Other | Admitting: Physical Therapy

## 2023-08-25 ENCOUNTER — Encounter (HOSPITAL_BASED_OUTPATIENT_CLINIC_OR_DEPARTMENT_OTHER): Payer: Self-pay | Admitting: Physical Therapy

## 2023-08-25 DIAGNOSIS — M5459 Other low back pain: Secondary | ICD-10-CM

## 2023-08-25 DIAGNOSIS — M6281 Muscle weakness (generalized): Secondary | ICD-10-CM | POA: Diagnosis not present

## 2023-08-25 DIAGNOSIS — R2689 Other abnormalities of gait and mobility: Secondary | ICD-10-CM

## 2023-08-25 DIAGNOSIS — R29898 Other symptoms and signs involving the musculoskeletal system: Secondary | ICD-10-CM | POA: Diagnosis not present

## 2023-08-25 NOTE — Therapy (Signed)
OUTPATIENT PHYSICAL THERAPY THORACOLUMBAR TREATMENT   Patient Name: Christina Henderson MRN: [id number] DOB:06-May-1936, 88 y.o., female Today's Date: 08/25/2023  END OF SESSION:  PT End of Session - 08/25/23 1516     Visit Number 5    Number of Visits 16    Date for PT Re-Evaluation 09/26/23    Authorization Type Medicare    Progress Note Due on Visit 10    PT Start Time 1514    PT Stop Time 1554    PT Time Calculation (min) 40 min    Activity Tolerance Patient tolerated treatment well    Behavior During Therapy [facility] for tasks assessed/performed             Past Medical History:  Diagnosis Date   Arthritis    rhuematoid arthritis   Breast cancer (HCC) 2018   Right Breast Cancer   Cancer (HCC) 03/2017   right breast cancer   Complication of anesthesia    hard to wake up   GERD (gastroesophageal reflux disease)    food related   Glaucoma    Hypertension    Osteoporosis    osteopenia   Rosacea    Past Surgical History:  Procedure Laterality Date   APPENDECTOMY     BACK SURGERY     BREAST BIOPSY     BREAST LUMPECTOMY Right 04/2017   BREAST LUMPECTOMY WITH RADIOACTIVE SEED AND SENTINEL LYMPH NODE BIOPSY Bilateral 05/09/2017   Procedure: BILATERAL RADIOACTIVE SEED GUIDED LUMPECTOMIES WITH RIGHT SENTINEL LYMPH NODE BIOPSY;  Surgeon: Harriette Bouillon, MD;  Location: [facility];  Service: General;  Laterality: Bilateral;   EYE SURGERY     cataract   SPINE SURGERY     lumbar lamenectomy   TONSILLECTOMY     Patient Active Problem List   Diagnosis Date Noted   SOB (shortness of breath) 02/15/2023   Bradycardia 08/15/2022   Dyslipidemia 11/25/2019   Educated about COVID-19 virus infection 11/25/2019   Chest pain 07/17/2018   Cough 07/17/2018   Essential hypertension 07/17/2018   Malignant neoplasm of upper-outer quadrant of right breast in female, estrogen receptor positive (HCC) 05/23/2017    PCP: Gweneth Dimitri MD  REFERRING PROVIDER: Hurman Horn, MD  REFERRING DIAG: M54.50 (ICD-10-CM) - Low back pain, unspecified; bilateral hip pain  Rationale for Evaluation and Treatment: Rehabilitation  THERAPY DIAG:  Other low back pain  Muscle weakness (generalized)  Other abnormalities of gait and mobility  ONSET DATE: about 6 weeks  SUBJECTIVE:  SUBJECTIVE STATEMENT: Pt reports that she has been going to pool every day; swimming and doing the aquatic exercises from [facility].  She is reporting that her back and back of legs are hurting less now than when she began therapy. 50% improvement.  Pain is no longer down backs of legs to feet - just "to tush and upper thighs".    From initial evaluation:  Patient states she does swimming and MD wanted her to build up bones up last year with PT. Now back has been painful. Pain began at least 6 weeks ago with insidious onset. Notes pain with walking, transfers, sitting. Patient stating pain in low back, bilateral hips, and hamstrings. Symptoms improve with more walking. Sitting for a long time 30-50 minutes and then when she gets up it hurts and so does after getting up in the morning.   PERTINENT HISTORY:  Hx cancer, osteopenia, HTN, 1974 back surgery   PAIN:  Are you having pain? Yes Location: bilat buttocks and upper hamstrings NPRS: 1.2/10   PRECAUTIONS: None  WEIGHT BEARING RESTRICTIONS: No  FALLS:  Has patient fallen in last 6 months? No   PLOF: Independent  PATIENT GOALS: eliminate the pain   OBJECTIVE: (objective measures from initial evaluation unless otherwise dated)   PATIENT SURVEYS:  FOTO 65% function  SCREENING FOR RED FLAGS: Bowel or bladder incontinence: No Spinal tumors: No Cauda equina syndrome: No Compression fracture: No Abdominal aneurysm: No  COGNITION: Overall  cognitive status: Within functional limits for tasks assessed     SENSATION: WFL    PALPATION: No tenderness in lumbar spine. Tender at L greater Trochanter and R lateral hamstrings  LUMBAR ROM:   AROM eval  Flexion 25% limited (posterior LE "tight")  Extension 25% limited   Right lateral flexion 25% limited   Left lateral flexion 25% limited   Right rotation   Left rotation    (Blank rows = not tested) * = pain/symptoms  LOWER EXTREMITY ROM:   decreased hip extension bilaterally  Active  Right eval Left eval  Hip flexion    Hip extension    Hip abduction    Hip adduction    Hip internal rotation    Hip external rotation    Knee flexion    Knee extension    Ankle dorsiflexion    Ankle plantarflexion    Ankle inversion    Ankle eversion     (Blank rows = not tested) * = pain/symptoms  LOWER EXTREMITY MMT:    MMT Right eval Left eval  Hip flexion 4+ 4+  Hip extension 3+ (ROM only to neutral) 3+ (ROM only to neutral)  Hip abduction 4- 4-  Hip adduction    Hip internal rotation    Hip external rotation    Knee flexion 5 5  Knee extension 5 5  Ankle dorsiflexion 5 5  Ankle plantarflexion    Ankle inversion    Ankle eversion     (Blank rows = not tested) * = pain/symptoms    FUNCTIONAL TESTS:  5 times sit to stand: 12.32 seconds without UE, support, dynamic knee valgus bilaterally, decreased eccentric control.   08/25/23: 5x STS - 11.07s  GAIT: Distance walked: 100 feet Assistive device utilized: None Level of assistance: Complete Independence Comments: WFL  TODAY'S TREATMENT:  [facility] Adult PT Treatment:                                                DATE: 08/25/23  Therapeutic Exercise: NuStep L4: UE/LE x 5 min for warm up UE on counter:  hip ext x 10 each;  Hip abdct x 10 each;  heel toe raises x 10  Seated hamstring stretch  with DF x 15s x 2 each LE  5x STS ( see above)  Seated pirirformis stretch 2 versions x 15s each Walking 300 ft  Self Care: Reviewed body mechanics for changing cat's water bowl (bend knees/hips and not just from back); pt returned demo  Educated on self massage with stick roller to buttocks and LEs; pt returned demo  DATE:  08/21/23 Pt seen for aquatic therapy today.  Treatment took place in water 3.5-4.75 ft in depth at the [facility] pool. Temp of water was 91.  Pt entered/exited the pool via stairs independently with bilat rail. - unsupported walking forward/ backward with cues for vertical trunk -Side stepping with arm addct/ abdct with rainbow hand floats - farmer carry with bilat rainbow hand floats, walking forward/ backward -> single yellow  - marching with reciprocal arm motion with rainbow hand floats - UE on yellow hand floats hip abdct/ addct x 10 each;  leg swings into hip flexion/ext x 10 each - cues to slow speed and engage core x 10 each LE - UE on wall: hip abdct/ addct x 10 each;  leg swings into hip flexion/ext x 10 each; single leg clams x 10 each  - TrA set with single rainbow hand floats pull downs to thighs x 10 - squats pulling single yellow hand float under water x 10 - straddling noodle and UE on corner: cycling, hip abdct/ addct and cross country ski - 3 way LE stretch with hollow blue noodle (ITB, hamstrings, adductor stretches) at ankle x 15s each position   08/18/23 Pt seen for aquatic therapy today.  Treatment took place in water 3.5-4.75 ft in depth at the [facility] pool. Temp of water was 91.  Pt entered/exited the pool via stairs independently with bilat rail. - unsupported walking forward/ backward with cues for vertical trunk - farmer carry with bilat rainbow hand floats, walking forward/ backward -> single yellow  - marching with reciprocal arm motion with rainbow hand floats - UE on rainbow hand floats 3 way LE kicks - cues to  slow speed and engage core x 10 each LE - UE on yellow hand floats, 3 way toe taps x 5 each LE; heel raises x 12 - TrA set with short hollow noodle pull downs to thighs x 8 -> single rainbow hand floats x 10 - UE on wall: hip abdct/ addct x 10 each;  leg swings into hip flexion/ext x 10 each - straddling noodle and UE on corner: cycling, hip abdct/ addct and cross country ski   08/16/23 NuStep L4: UE/LE x 5 min for warm up Seated hamstring stretch x15s x 2 each LE Seated piriformis stretch x 15s x 2 each LE Bridge - painful - stopped after 2 Standing resisted shoulder ext with core engaged, red band x 15;  resisted bilat row with red band x 15 STS with cues for forward arm reach, hip hinge, core engaged 2 x 5 Trunk ext (limited  tolerance past neutral)    PATIENT EDUCATION:  Education details: exercise rationale, modifications, progressions Person educated: Patient Education method: Explanation, Demonstration,  Education comprehension: verbalized understanding, returned demonstration, verbal cues required, and tactile cues required  HOME EXERCISE PROGRAM: Access Code: [email or web address] URL: [email or web address] Date: 08/01/2023  - Supine Hamstring Stretch  - 1 x daily - 7 x weekly - 5 reps - 20 second hold - Sit to/from Stand x5, 2x/day - standing bilat shoulder ext with red band x 15;     AQUATIC Access Code: [email or web address] URL: [email or web address] Date: 02/010/2025   Prepared by: [facility] [address] This aquatic home exercise program from [employer] utilizes pictures from land based exercises, but has been adapted prior to lamination and issuance.   ASSESSMENT:  CLINICAL IMPRESSION: Positive response to aquatic therapy thus far; pt's overall pain level has improved. Pt reporting compliance with water exercises more than land exercises. Pt to return to pool next week for progression/modification of aquatic HEP. Pt has met STG 2 and LTG4.     From initial evaluation:  Patient a 88 y.o. y.o. female who was seen today for physical therapy evaluation and treatment for low back pain. Patient presents with pain limited deficits in lumbar spine strength, ROM, endurance, activity tolerance, and functional mobility with ADL. Patient is having to modify and restrict ADL as indicated by outcome measure score as well as subjective information and objective measures which is affecting overall participation. Patient will benefit from skilled physical therapy in order to improve function and reduce impairment.  OBJECTIVE IMPAIRMENTS: decreased activity tolerance, decreased balance, decreased mobility, difficulty walking, decreased ROM, decreased strength, improper body mechanics, and pain.   ACTIVITY LIMITATIONS: carrying, lifting, bending, sitting, standing, squatting, locomotion level, and caring for others  PARTICIPATION LIMITATIONS: meal prep, cleaning, laundry, shopping, and community activity  PERSONAL FACTORS: Age, Time since onset of injury/illness/exacerbation, and 3+ comorbidities: chronic back pain, Hx cancer, osteopenia, HTN  are also affecting patient's functional outcome.   REHAB POTENTIAL: Good  CLINICAL DECISION MAKING: Stable/uncomplicated  EVALUATION COMPLEXITY: Low   GOALS: Goals reviewed with patient? Yes  SHORT TERM GOALS: Target date: 08/29/2023    Patient will be independent with HEP in order to improve functional outcomes. Baseline:  Goal status: INITIAL  2.  Patient will report at least 25% improvement in symptoms for improved quality of life. Baseline:  Goal status:MET - 08/25/23    LONG TERM GOALS: Target date: 09/26/2023    Patient will report at least 75% improvement in symptoms for improved quality of life. Baseline:  Goal status:IN PROGRESS   2.  Patient will improve FOTO score to predicted outcomes in order to indicate improved tolerance to activity. Baseline: 65% function Goal status:  INITIAL  3.  Patient will demonstrate at least 25% improvement in lumbar ROM in all restricted planes for improved ability to move trunk while completing chores. Baseline:  Goal status: INITIAL  4.  Patient will be able to complete 5x STS in under 11.4 seconds without compensation in order to reduce the risk of falls. Baseline: 12.32 seconds without UE, support, dynamic knee valgus bilaterally, decreased eccentric control. Goal status: MET - 08/25/23  5.  Patient will demonstrate grade of 4+/5 MMT grade in all tested musculature as evidence of improved strength to assist with stair ambulation and gait.   Baseline: see above Goal status: INITIAL     PLAN:  PT FREQUENCY: 1-2x/week  PT DURATION: 8 weeks  PLANNED INTERVENTIONS: 30865- PT Re-evaluation, 97110-Therapeutic  exercises, 97530- Therapeutic activity, [id number]- Neuromuscular re-education, [phone]- Self Care, [id number]- Manual therapy, [phone]- Gait training, [phone]- Orthotic Fit/training, [phone]- Canalith repositioning, [id number]- Aquatic Therapy, [phone]- Splinting, Patient/Family education, Balance training, Stair training, Taping, Dry Needling, Joint mobilization, Joint manipulation, Spinal manipulation, Spinal mobilization, Scar mobilization, and DME instructions.  PLAN FOR NEXT SESSION: f/u with HEP, core and glute strength, postural strength, lumbar, hip and hamstring mobility  Mayer Camel, PTA 08/25/23 5:11 PM [facility] [address] [place], [place], [address] Phone: [phone]   Fax:  [phone]

## 2023-08-29 DIAGNOSIS — L858 Other specified epidermal thickening: Secondary | ICD-10-CM | POA: Diagnosis not present

## 2023-08-30 ENCOUNTER — Encounter (HOSPITAL_BASED_OUTPATIENT_CLINIC_OR_DEPARTMENT_OTHER): Payer: Self-pay | Admitting: Physical Therapy

## 2023-08-30 ENCOUNTER — Ambulatory Visit (HOSPITAL_BASED_OUTPATIENT_CLINIC_OR_DEPARTMENT_OTHER): Payer: Medicare Other | Admitting: Physical Therapy

## 2023-08-30 DIAGNOSIS — R29898 Other symptoms and signs involving the musculoskeletal system: Secondary | ICD-10-CM | POA: Diagnosis not present

## 2023-08-30 DIAGNOSIS — M5459 Other low back pain: Secondary | ICD-10-CM | POA: Diagnosis not present

## 2023-08-30 DIAGNOSIS — R2689 Other abnormalities of gait and mobility: Secondary | ICD-10-CM | POA: Diagnosis not present

## 2023-08-30 DIAGNOSIS — M6281 Muscle weakness (generalized): Secondary | ICD-10-CM

## 2023-08-30 NOTE — Therapy (Signed)
OUTPATIENT PHYSICAL THERAPY THORACOLUMBAR TREATMENT   Patient Name: Christina Henderson MRN: [id number] DOB:[birth year], 88 y.o., female Today's Date: 08/30/2023  END OF SESSION:  PT End of Session - 08/30/23 1140     Visit Number 6    Number of Visits 16    Date for PT Re-Evaluation 09/26/23    Authorization Type Medicare    Progress Note Due on Visit 10    PT Start Time 0845    PT Stop Time 0925    PT Time Calculation (min) 40 min    Activity Tolerance Patient tolerated treatment well    Behavior During Therapy [facility] for tasks assessed/performed              Past Medical History:  Diagnosis Date   Arthritis    rhuematoid arthritis   Breast cancer (HCC) 2018   Right Breast Cancer   Cancer (HCC) 03/2017   right breast cancer   Complication of anesthesia    hard to wake up   GERD (gastroesophageal reflux disease)    food related   Glaucoma    Hypertension    Osteoporosis    osteopenia   Rosacea    Past Surgical History:  Procedure Laterality Date   APPENDECTOMY     BACK SURGERY     BREAST BIOPSY     BREAST LUMPECTOMY Right 04/2017   BREAST LUMPECTOMY WITH RADIOACTIVE SEED AND SENTINEL LYMPH NODE BIOPSY Bilateral 05/09/2017   Procedure: BILATERAL RADIOACTIVE SEED GUIDED LUMPECTOMIES WITH RIGHT SENTINEL LYMPH NODE BIOPSY;  Surgeon: Harriette Bouillon, MD;  Location: [facility];  Service: General;  Laterality: Bilateral;   EYE SURGERY     cataract   SPINE SURGERY     lumbar lamenectomy   TONSILLECTOMY     Patient Active Problem List   Diagnosis Date Noted   SOB (shortness of breath) 02/15/2023   Bradycardia 08/15/2022   Dyslipidemia 11/25/2019   Educated about COVID-19 virus infection 11/25/2019   Chest pain 07/17/2018   Cough 07/17/2018   Essential hypertension 07/17/2018   Malignant neoplasm of upper-outer quadrant of right breast in female, estrogen receptor positive (HCC) 05/23/2017    PCP: Gweneth Dimitri MD  REFERRING PROVIDER:  Hurman Horn, MD  REFERRING DIAG: M54.50 (ICD-10-CM) - Low back pain, unspecified; bilateral hip pain  Rationale for Evaluation and Treatment: Rehabilitation  THERAPY DIAG:  Other low back pain  Muscle weakness (generalized)  Other abnormalities of gait and mobility  Other symptoms and signs involving the musculoskeletal system  ONSET DATE: about 6 weeks  SUBJECTIVE:  SUBJECTIVE STATEMENT: "I'm achy". Pt reports she bought a roller stick and has been massaging LE and buttocks.  Pain is now only in buttocks when she stands, no longer in LEs.     From initial evaluation:  Patient states she does swimming and MD wanted her to build up bones up last year with PT. Now back has been painful. Pain began at least 6 weeks ago with insidious onset. Notes pain with walking, transfers, sitting. Patient stating pain in low back, bilateral hips, and hamstrings. Symptoms improve with more walking. Sitting for a long time 30-50 minutes and then when she gets up it hurts and so does after getting up in the morning.   PERTINENT HISTORY:  Hx cancer, osteopenia, HTN, 1974 back surgery   PAIN:  Are you having pain? Yes Location: bilat buttocks  NPRS: 3/10   PRECAUTIONS: None  WEIGHT BEARING RESTRICTIONS: No  FALLS:  Has patient fallen in last 6 months? No   PLOF: Independent  PATIENT GOALS: eliminate the pain   OBJECTIVE: (objective measures from initial evaluation unless otherwise dated)   PATIENT SURVEYS:  FOTO 65% function  SCREENING FOR RED FLAGS: Bowel or bladder incontinence: No Spinal tumors: No Cauda equina syndrome: No Compression fracture: No Abdominal aneurysm: No  COGNITION: Overall cognitive status: Within functional limits for tasks  assessed     SENSATION: WFL    PALPATION: No tenderness in lumbar spine. Tender at L greater Trochanter and R lateral hamstrings  LUMBAR ROM:   AROM eval  Flexion 25% limited (posterior LE "tight")  Extension 25% limited   Right lateral flexion 25% limited   Left lateral flexion 25% limited   Right rotation   Left rotation    (Blank rows = not tested) * = pain/symptoms  LOWER EXTREMITY ROM:   decreased hip extension bilaterally  Active  Right eval Left eval  Hip flexion    Hip extension    Hip abduction    Hip adduction    Hip internal rotation    Hip external rotation    Knee flexion    Knee extension    Ankle dorsiflexion    Ankle plantarflexion    Ankle inversion    Ankle eversion     (Blank rows = not tested) * = pain/symptoms  LOWER EXTREMITY MMT:    MMT Right eval Left eval  Hip flexion 4+ 4+  Hip extension 3+ (ROM only to neutral) 3+ (ROM only to neutral)  Hip abduction 4- 4-  Hip adduction    Hip internal rotation    Hip external rotation    Knee flexion 5 5  Knee extension 5 5  Ankle dorsiflexion 5 5  Ankle plantarflexion    Ankle inversion    Ankle eversion     (Blank rows = not tested) * = pain/symptoms    FUNCTIONAL TESTS:  5 times sit to stand: 12.32 seconds without UE, support, dynamic knee valgus bilaterally, decreased eccentric control.   08/25/23: 5x STS - 11.07s  GAIT: Distance walked: 100 feet Assistive device utilized: None Level of assistance: Complete Independence Comments: WFL  TODAY'S TREATMENT:  DATE:  08/29/23 Pt seen for aquatic therapy today.  Treatment took place in water 3.5-4.75 ft in depth at the [facility] pool. Temp of water was 91.  Pt entered/exited the pool via stairs independently with bilat rail. - unsupported walking forward/ backward with cues for vertical trunk - wide  stance - reciprocal arm swing with light resistance bells, horiz abdct/addct (under water) with bells -> walking forward/ backward with recipocal arm swing with green bells -Side stepping with arm addct/ abdct with rainbow hand floats - farmer carry with bilat rainbow hand floats, walking forward/ backward -> single rainbow - UE on rainbow hand floats- single hand against wall:  hip abdct/ addct, crossing midline x 15 each;  leg swings into hip flexion/ext x 15 each engage core  - UE on rainbow hand floats:  tandem gait forward/ backward x 2 laps, cues to slow speed - squats pulling single yellow hand float under water 2 x 10 - TrA set with solid -> hollow noodle pull down to thighs, 4 sec release to surface x 15  - straddling noodle and UE on corner: cycling, hip abdct/ addct and cross country ski - 3 way LE stretch with hollow blue noodle (ITB, hamstrings, adductor stretches) at ankle x 15s each position   [facility] Adult PT Treatment:                                                DATE: 08/25/23  Therapeutic Exercise: NuStep L4: UE/LE x 5 min for warm up UE on counter:  hip ext x 10 each;  Hip abdct x 10 each;  heel toe raises x 10  Seated hamstring stretch with DF x 15s x 2 each LE  5x STS ( see above)  Seated pirirformis stretch 2 versions x 15s each Walking 300 ft  Self Care: Reviewed body mechanics for changing cat's water bowl (bend knees/hips and not just from back); pt returned demo  Educated on self massage with stick roller to buttocks and LEs; pt returned demo  DATE:  08/21/23 Pt seen for aquatic therapy today.  Treatment took place in water 3.5-4.75 ft in depth at the [facility] pool. Temp of water was 91.  Pt entered/exited the pool via stairs independently with bilat rail. - unsupported walking forward/ backward with cues for vertical trunk -Side stepping with arm addct/ abdct with rainbow hand floats - farmer carry with bilat rainbow hand floats, walking forward/  backward -> single yellow  - marching with reciprocal arm motion with rainbow hand floats - UE on yellow hand floats hip abdct/ addct x 10 each;  leg swings into hip flexion/ext x 10 each - cues to slow speed and engage core x 10 each LE - UE on wall: hip abdct/ addct x 10 each;  leg swings into hip flexion/ext x 10 each; single leg clams x 10 each  - TrA set with single rainbow hand floats pull downs to thighs x 10 - squats pulling single yellow hand float under water x 10 - straddling noodle and UE on corner: cycling, hip abdct/ addct and cross country ski - 3 way LE stretch with hollow blue noodle (ITB, hamstrings, adductor stretches) at ankle x 15s each position   08/18/23 Pt seen for aquatic therapy today.  Treatment took place in water 3.5-4.75 ft in depth at the [facility]  pool. Temp of water was 91.  Pt entered/exited the pool via stairs independently with bilat rail. - unsupported walking forward/ backward with cues for vertical trunk - farmer carry with bilat rainbow hand floats, walking forward/ backward -> single yellow  - marching with reciprocal arm motion with rainbow hand floats - UE on rainbow hand floats 3 way LE kicks - cues to slow speed and engage core x 10 each LE - UE on yellow hand floats, 3 way toe taps x 5 each LE; heel raises x 12 - TrA set with short hollow noodle pull downs to thighs x 8 -> single rainbow hand floats x 10 - UE on wall: hip abdct/ addct x 10 each;  leg swings into hip flexion/ext x 10 each - straddling noodle and UE on corner: cycling, hip abdct/ addct and cross country ski   08/16/23 NuStep L4: UE/LE x 5 min for warm up Seated hamstring stretch x15s x 2 each LE Seated piriformis stretch x 15s x 2 each LE Bridge - painful - stopped after 2 Standing resisted shoulder ext with core engaged, red band x 15;  resisted bilat row with red band x 15 STS with cues for forward arm reach, hip hinge, core engaged 2 x 5 Trunk ext (limited tolerance  past neutral)    PATIENT EDUCATION:  Education details: exercise rationale, modifications, progressions Person educated: Patient Education method: Programmer, multimedia, Demonstration,  Education comprehension: verbalized understanding, returned demonstration, verbal cues required, and tactile cues required  HOME EXERCISE PROGRAM: Land Access Code: [email or web address] URL: [email or web address]    AQUATIC Access Code: [email or web address] URL: [email or web address] Date: 02/010/2025   Prepared by: [facility] [address] This aquatic home exercise program from Med[employer] utilizes pictures from land based exercises, but has been adapted prior to lamination and issuance.   ASSESSMENT:  CLINICAL IMPRESSION: Pt tolerated session well with good tolerance with use of resistance bells.  She is reporting centralization of symptoms.  3 way LE stretch did irritate her LB; may need to trial foot on steps instead next visit.  Updated land HEP to include only stretches at this time since she is completing aquatic HEP almost daily.   From initial evaluation:  Patient a 88 y.o. y.o. female who was seen today for physical therapy evaluation and treatment for low back pain. Patient presents with pain limited deficits in lumbar spine strength, ROM, endurance, activity tolerance, and functional mobility with ADL. Patient is having to modify and restrict ADL as indicated by outcome measure score as well as subjective information and objective measures which is affecting overall participation. Patient will benefit from skilled physical therapy in order to improve function and reduce impairment.  OBJECTIVE IMPAIRMENTS: decreased activity tolerance, decreased balance, decreased mobility, difficulty walking, decreased ROM, decreased strength, improper body mechanics, and pain.   ACTIVITY LIMITATIONS: carrying, lifting, bending, sitting, standing, squatting, locomotion level, and caring for  others  PARTICIPATION LIMITATIONS: meal prep, cleaning, laundry, shopping, and community activity  PERSONAL FACTORS: Age, Time since onset of injury/illness/exacerbation, and 3+ comorbidities: chronic back pain, Hx cancer, osteopenia, HTN  are also affecting patient's functional outcome.   REHAB POTENTIAL: Good  CLINICAL DECISION MAKING: Stable/uncomplicated  EVALUATION COMPLEXITY: Low   GOALS: Goals reviewed with patient? Yes  SHORT TERM GOALS: Target date: 08/29/2023    Patient will be independent with HEP in order to improve functional outcomes. Baseline:  Goal status: In progress - 08/30/23  2.  Patient will report at least 25% improvement in symptoms for  improved quality of life. Baseline:  Goal status:MET - 08/25/23    LONG TERM GOALS: Target date: 09/26/2023    Patient will report at least 75% improvement in symptoms for improved quality of life. Baseline:  Goal status:IN PROGRESS   2.  Patient will improve FOTO score to predicted outcomes in order to indicate improved tolerance to activity. Baseline: 65% function Goal status: INITIAL  3.  Patient will demonstrate at least 25% improvement in lumbar ROM in all restricted planes for improved ability to move trunk while completing chores. Baseline:  Goal status: INITIAL  4.  Patient will be able to complete 5x STS in under 11.4 seconds without compensation in order to reduce the risk of falls. Baseline: 12.32 seconds without UE, support, dynamic knee valgus bilaterally, decreased eccentric control. Goal status: MET - 08/25/23  5.  Patient will demonstrate grade of 4+/5 MMT grade in all tested musculature as evidence of improved strength to assist with stair ambulation and gait.   Baseline: see above Goal status: INITIAL     PLAN:  PT FREQUENCY: 1-2x/week  PT DURATION: 8 weeks  PLANNED INTERVENTIONS: 97164- PT Re-evaluation, 97110-Therapeutic exercises, 97530- Therapeutic activity, 97112- Neuromuscular  re-education, 97535- Self Care, [id number]- Manual therapy, [phone]- Gait training, [phone]- Orthotic Fit/training, [phone]- Canalith repositioning, [id number]- Aquatic Therapy, [phone]- Splinting, Patient/Family education, Balance training, Stair training, Taping, Dry Needling, Joint mobilization, Joint manipulation, Spinal manipulation, Spinal mobilization, Scar mobilization, and DME instructions.  PLAN FOR NEXT SESSION: f/u with HEP, core and glute strength, postural strength, lumbar, hip and hamstring mobility

## 2023-09-01 ENCOUNTER — Encounter (HOSPITAL_BASED_OUTPATIENT_CLINIC_OR_DEPARTMENT_OTHER): Payer: Self-pay

## 2023-09-01 ENCOUNTER — Ambulatory Visit (HOSPITAL_BASED_OUTPATIENT_CLINIC_OR_DEPARTMENT_OTHER): Payer: Medicare Other | Admitting: Physical Therapy

## 2023-09-06 ENCOUNTER — Ambulatory Visit (HOSPITAL_BASED_OUTPATIENT_CLINIC_OR_DEPARTMENT_OTHER): Payer: Medicare Other | Admitting: Physical Therapy

## 2023-09-06 ENCOUNTER — Encounter (HOSPITAL_BASED_OUTPATIENT_CLINIC_OR_DEPARTMENT_OTHER): Payer: Self-pay | Admitting: Physical Therapy

## 2023-09-06 DIAGNOSIS — M5459 Other low back pain: Secondary | ICD-10-CM

## 2023-09-06 DIAGNOSIS — R2689 Other abnormalities of gait and mobility: Secondary | ICD-10-CM | POA: Diagnosis not present

## 2023-09-06 DIAGNOSIS — R29898 Other symptoms and signs involving the musculoskeletal system: Secondary | ICD-10-CM

## 2023-09-06 DIAGNOSIS — M6281 Muscle weakness (generalized): Secondary | ICD-10-CM

## 2023-09-06 NOTE — Therapy (Signed)
 OUTPATIENT PHYSICAL THERAPY THORACOLUMBAR TREATMENT   Patient Name: Christina Henderson MRN: [id number] DOB:Jun 02, 1936, 88 y.o., female Today's Date: 09/06/2023  END OF SESSION:  PT End of Session - 09/06/23 0939     Visit Number 7    Number of Visits 16    Date for PT Re-Evaluation 09/26/23    Authorization Type Medicare    Progress Note Due on Visit 10    PT Start Time 0932    PT Stop Time 1011    PT Time Calculation (min) 39 min    Behavior During Therapy [facility] for tasks assessed/performed              Past Medical History:  Diagnosis Date   Arthritis    rhuematoid arthritis   Breast cancer (HCC) 2018   Right Breast Cancer   Cancer (HCC) 03/2017   right breast cancer   Complication of anesthesia    hard to wake up   GERD (gastroesophageal reflux disease)    food related   Glaucoma    Hypertension    Osteoporosis    osteopenia   Rosacea    Past Surgical History:  Procedure Laterality Date   APPENDECTOMY     BACK SURGERY     BREAST BIOPSY     BREAST LUMPECTOMY Right 04/2017   BREAST LUMPECTOMY WITH RADIOACTIVE SEED AND SENTINEL LYMPH NODE BIOPSY Bilateral 05/09/2017   Procedure: BILATERAL RADIOACTIVE SEED GUIDED LUMPECTOMIES WITH RIGHT SENTINEL LYMPH NODE BIOPSY;  Surgeon: Harriette Bouillon, MD;  Location: [facility];  Service: General;  Laterality: Bilateral;   EYE SURGERY     cataract   SPINE SURGERY     lumbar lamenectomy   TONSILLECTOMY     Patient Active Problem List   Diagnosis Date Noted   SOB (shortness of breath) 02/15/2023   Bradycardia 08/15/2022   Dyslipidemia 11/25/2019   Educated about COVID-19 virus infection 11/25/2019   Chest pain 07/17/2018   Cough 07/17/2018   Essential hypertension 07/17/2018   Malignant neoplasm of upper-outer quadrant of right breast in female, estrogen receptor positive (HCC) 05/23/2017    PCP: Gweneth Dimitri MD  REFERRING PROVIDER: Hurman Horn, MD  REFERRING DIAG: M54.50 (ICD-10-CM) -  Low back pain, unspecified; bilateral hip pain  Rationale for Evaluation and Treatment: Rehabilitation  THERAPY DIAG:  Other low back pain  Muscle weakness (generalized)  Other abnormalities of gait and mobility  Other symptoms and signs involving the musculoskeletal system  ONSET DATE: about 6 weeks  SUBJECTIVE:  SUBJECTIVE STATEMENT: "The hip stretch messed up my back."  (Seated piriformis).  Pt reports that prior to completion of stretch she had 2-3 days painfree.  She is able to side sleep with less pain.    From initial evaluation:  Patient states she does swimming and MD wanted her to build up bones up last year with PT. Now back has been painful. Pain began at least 6 weeks ago with insidious onset. Notes pain with walking, transfers, sitting. Patient stating pain in low back, bilateral hips, and hamstrings. Symptoms improve with more walking. Sitting for a long time 30-50 minutes and then when she gets up it hurts and so does after getting up in the morning.   PERTINENT HISTORY:  Hx cancer, osteopenia, HTN, 1974 back surgery   PAIN:  Are you having pain? Yes Location: R  buttocks and post thigh NPRS: 5/10   PRECAUTIONS: None  WEIGHT BEARING RESTRICTIONS: No  FALLS:  Has patient fallen in last 6 months? No   PLOF: Independent  PATIENT GOALS: eliminate the pain   OBJECTIVE: (objective measures from initial evaluation unless otherwise dated)   PATIENT SURVEYS:  FOTO 65% function  SCREENING FOR RED FLAGS: Bowel or bladder incontinence: No Spinal tumors: No Cauda equina syndrome: No Compression fracture: No Abdominal aneurysm: No  COGNITION: Overall cognitive status: Within functional limits for tasks assessed     SENSATION: WFL    PALPATION: No tenderness in  lumbar spine. Tender at L greater Trochanter and R lateral hamstrings  LUMBAR ROM:   AROM eval  Flexion 25% limited (posterior LE "tight")  Extension 25% limited   Right lateral flexion 25% limited   Left lateral flexion 25% limited   Right rotation   Left rotation    (Blank rows = not tested) * = pain/symptoms  LOWER EXTREMITY ROM:   decreased hip extension bilaterally  Active  Right eval Left eval  Hip flexion    Hip extension    Hip abduction    Hip adduction    Hip internal rotation    Hip external rotation    Knee flexion    Knee extension    Ankle dorsiflexion    Ankle plantarflexion    Ankle inversion    Ankle eversion     (Blank rows = not tested) * = pain/symptoms  LOWER EXTREMITY MMT:    MMT Right eval Left eval  Hip flexion 4+ 4+  Hip extension 3+ (ROM only to neutral) 3+ (ROM only to neutral)  Hip abduction 4- 4-  Hip adduction    Hip internal rotation    Hip external rotation    Knee flexion 5 5  Knee extension 5 5  Ankle dorsiflexion 5 5  Ankle plantarflexion    Ankle inversion    Ankle eversion     (Blank rows = not tested) * = pain/symptoms    FUNCTIONAL TESTS:  5 times sit to stand: 12.32 seconds without UE, support, dynamic knee valgus bilaterally, decreased eccentric control.   08/25/23: 5x STS - 11.07s  GAIT: Distance walked: 100 feet Assistive device utilized: None Level of assistance: Complete Independence Comments: WFL  TODAY'S TREATMENT:  DATE:  09/06/23 Pt seen for aquatic therapy today.  Treatment took place in water 3.5-4.75 ft in depth at the [facility] pool. Temp of water was 91.  Pt entered/exited the pool via stairs independently with bilat rail. - unsupported walking forward/ backward with cues for vertical trunk -Side stepping with arm addct/ abdct with rainbow hand floats - plank  with yellow hand floats ->trial with leg lifts (challenge) and superman to/from plank (difficult) - TrA set with hollow noodle pull down to thighs, x 10 wide stance, x 5 staggered stance, x 5 in wide stance with 4 sec release to surface  - UE on wall:  single leg clams x 10 - - 3 way LE stretch with hollow blue noodle (ITB, hamstrings, adductor stretches) at ankle x 15s each position - squats pulling single yellow hand float under water x 10 (cues to allow heels to come up) - farmer carry with single yellow hand floats, walking forward/ backward  - straddling noodle and UE on corner: cycling  08/29/23 Pt seen for aquatic therapy today.  Treatment took place in water 3.5-4.75 ft in depth at the [facility] pool. Temp of water was 91.  Pt entered/exited the pool via stairs independently with bilat rail. - unsupported walking forward/ backward with cues for vertical trunk - wide stance - reciprocal arm swing with light resistance bells, horiz abdct/addct (under water) with bells -> walking forward/ backward with recipocal arm swing with green bells -Side stepping with arm addct/ abdct with rainbow hand floats - farmer carry with bilat rainbow hand floats, walking forward/ backward -> single rainbow - UE on rainbow hand floats- single hand against wall:  hip abdct/ addct, crossing midline x 15 each;  leg swings into hip flexion/ext x 15 each engage core  - UE on rainbow hand floats:  tandem gait forward/ backward x 2 laps, cues to slow speed - squats pulling single yellow hand float under water 2 x 10 - TrA set with solid -> hollow noodle pull down to thighs, 4 sec release to surface x 15  - straddling noodle and UE on corner: cycling, hip abdct/ addct and cross country ski - 3 way LE stretch with hollow blue noodle (ITB, hamstrings, adductor stretches) at ankle x 15s each position   [facility] Adult PT Treatment:                                                DATE: 08/25/23  Therapeutic  Exercise: NuStep L4: UE/LE x 5 min for warm up UE on counter:  hip ext x 10 each;  Hip abdct x 10 each;  heel toe raises x 10  Seated hamstring stretch with DF x 15s x 2 each LE  5x STS ( see above)  Seated pirirformis stretch 2 versions x 15s each Walking 300 ft  Self Care: Reviewed body mechanics for changing cat's water bowl (bend knees/hips and not just from back); pt returned demo  Educated on self massage with stick roller to buttocks and LEs; pt returned demo  DATE:  08/21/23 Pt seen for aquatic therapy today.  Treatment took place in water 3.5-4.75 ft in depth at the [facility] pool. Temp of water was 91.  Pt entered/exited the pool via stairs independently with bilat rail. - unsupported walking forward/ backward with cues for vertical trunk -Side stepping with  arm addct/ abdct with rainbow hand floats - farmer carry with bilat rainbow hand floats, walking forward/ backward -> single yellow  - marching with reciprocal arm motion with rainbow hand floats - UE on yellow hand floats hip abdct/ addct x 10 each;  leg swings into hip flexion/ext x 10 each - cues to slow speed and engage core x 10 each LE - UE on wall: hip abdct/ addct x 10 each;  leg swings into hip flexion/ext x 10 each; single leg clams x 10 each  - TrA set with single rainbow hand floats pull downs to thighs x 10 - squats pulling single yellow hand float under water x 10 - straddling noodle and UE on corner: cycling, hip abdct/ addct and cross country ski - 3 way LE stretch with hollow blue noodle (ITB, hamstrings, adductor stretches) at ankle x 15s each position   08/18/23 Pt seen for aquatic therapy today.  Treatment took place in water 3.5-4.75 ft in depth at the [facility] pool. Temp of water was 91.  Pt entered/exited the pool via stairs independently with bilat rail. - unsupported walking forward/ backward with cues for vertical trunk - farmer carry with bilat rainbow hand floats, walking  forward/ backward -> single yellow  - marching with reciprocal arm motion with rainbow hand floats - UE on rainbow hand floats 3 way LE kicks - cues to slow speed and engage core x 10 each LE - UE on yellow hand floats, 3 way toe taps x 5 each LE; heel raises x 12 - TrA set with short hollow noodle pull downs to thighs x 8 -> single rainbow hand floats x 10 - UE on wall: hip abdct/ addct x 10 each;  leg swings into hip flexion/ext x 10 each - straddling noodle and UE on corner: cycling, hip abdct/ addct and cross country ski   08/16/23 NuStep L4: UE/LE x 5 min for warm up Seated hamstring stretch x15s x 2 each LE Seated piriformis stretch x 15s x 2 each LE Bridge - painful - stopped after 2 Standing resisted shoulder ext with core engaged, red band x 15;  resisted bilat row with red band x 15 STS with cues for forward arm reach, hip hinge, core engaged 2 x 5 Trunk ext (limited tolerance past neutral)    PATIENT EDUCATION:  Education details: exercise rationale, modifications, progressions Person educated: Patient Education method: Programmer, multimedia, Demonstration,  Education comprehension: verbalized understanding, returned demonstration, verbal cues required, and tactile cues required  HOME EXERCISE PROGRAM: Land Access Code: [email or web address] URL: [email or web address]    AQUATIC Access Code: [email or web address] URL: [email or web address] Date: 08/21/2023   Prepared by: [facility] This aquatic home exercise program from [employer] utilizes pictures from land based exercises, but has been adapted prior to lamination and issuance.   ASSESSMENT:  CLINICAL IMPRESSION: Pt reporting increase in Rt sided symptoms with piriformis stretch on land; informed to hold off on this but continue other exercises.She reported some relief in water, especially with noodle pull down to thighs.  Reviewed aquatic HEP and made small notes for clarification.  Next  session trial kick board push pull, balance on noodle, hip hinge, and  hip circles or SLS with UE challenge. Update HEP as needed.   From initial evaluation:  Patient a 88 y.o. y.o. female who was seen today for physical therapy evaluation and treatment for low back pain. Patient presents with pain limited deficits in lumbar spine strength, ROM, endurance, activity tolerance,  and functional mobility with ADL. Patient is having to modify and restrict ADL as indicated by outcome measure score as well as subjective information and objective measures which is affecting overall participation. Patient will benefit from skilled physical therapy in order to improve function and reduce impairment.  OBJECTIVE IMPAIRMENTS: decreased activity tolerance, decreased balance, decreased mobility, difficulty walking, decreased ROM, decreased strength, improper body mechanics, and pain.   ACTIVITY LIMITATIONS: carrying, lifting, bending, sitting, standing, squatting, locomotion level, and caring for others  PARTICIPATION LIMITATIONS: meal prep, cleaning, laundry, shopping, and community activity  PERSONAL FACTORS: Age, Time since onset of injury/illness/exacerbation, and 3+ comorbidities: chronic back pain, Hx cancer, osteopenia, HTN  are also affecting patient's functional outcome.   REHAB POTENTIAL: Good  CLINICAL DECISION MAKING: Stable/uncomplicated  EVALUATION COMPLEXITY: Low   GOALS: Goals reviewed with patient? Yes  SHORT TERM GOALS: Target date: 08/29/2023    Patient will be independent with HEP in order to improve functional outcomes. Baseline:  Goal status: In progress - 08/30/23  2.  Patient will report at least 25% improvement in symptoms for improved quality of life. Baseline:  Goal status:MET - 08/25/23    LONG TERM GOALS: Target date: 09/26/2023    Patient will report at least 75% improvement in symptoms for improved quality of life. Baseline:  Goal status:IN PROGRESS   2.  Patient  will improve FOTO score to predicted outcomes in order to indicate improved tolerance to activity. Baseline: 65% function Goal status: INITIAL  3.  Patient will demonstrate at least 25% improvement in lumbar ROM in all restricted planes for improved ability to move trunk while completing chores. Baseline:  Goal status: INITIAL  4.  Patient will be able to complete 5x STS in under 11.4 seconds without compensation in order to reduce the risk of falls. Baseline: 12.32 seconds without UE, support, dynamic knee valgus bilaterally, decreased eccentric control. Goal status: MET - 08/25/23  5.  Patient will demonstrate grade of 4+/5 MMT grade in all tested musculature as evidence of improved strength to assist with stair ambulation and gait.   Baseline: see above Goal status: INITIAL     PLAN:  PT FREQUENCY: 1-2x/week  PT DURATION: 8 weeks  PLANNED INTERVENTIONS: [id number]- PT Re-evaluation, 97110-Therapeutic exercises, 97530- Therapeutic activity, 97112- Neuromuscular re-education, 97535- Self Care, 13086- Manual therapy, [phone]- Gait training, [phone]- Orthotic Fit/training, [phone]- Canalith repositioning, [id number]- Aquatic Therapy, [phone]- Splinting, Patient/Family education, Balance training, Stair training, Taping, Dry Needling, Joint mobilization, Joint manipulation, Spinal manipulation, Spinal mobilization, Scar mobilization, and DME instructions.  PLAN FOR NEXT SESSION: f/u with HEP, core and glute strength, postural strength, lumbar, hip and hamstring mobility  Mayer Camel, PTA 09/06/23 12:50 PM [facility] [address] [place], [place], [address] Phone: [phone]   Fax:  [phone]

## 2023-09-11 ENCOUNTER — Encounter (HOSPITAL_BASED_OUTPATIENT_CLINIC_OR_DEPARTMENT_OTHER): Payer: Self-pay

## 2023-09-11 ENCOUNTER — Emergency Department (HOSPITAL_BASED_OUTPATIENT_CLINIC_OR_DEPARTMENT_OTHER)
Admission: EM | Admit: 2023-09-11 | Discharge: 2023-09-11 | Discharge status: Against Medical Advice | Attending: Emergency Medicine | Admitting: Emergency Medicine

## 2023-09-11 ENCOUNTER — Encounter (HOSPITAL_BASED_OUTPATIENT_CLINIC_OR_DEPARTMENT_OTHER): Payer: Self-pay | Admitting: Physical Therapy

## 2023-09-11 ENCOUNTER — Ambulatory Visit (HOSPITAL_BASED_OUTPATIENT_CLINIC_OR_DEPARTMENT_OTHER): Payer: Medicare Other | Attending: Sports Medicine | Admitting: Physical Therapy

## 2023-09-11 ENCOUNTER — Other Ambulatory Visit: Payer: Self-pay

## 2023-09-11 DIAGNOSIS — M5442 Lumbago with sciatica, left side: Secondary | ICD-10-CM | POA: Diagnosis not present

## 2023-09-11 DIAGNOSIS — G8929 Other chronic pain: Secondary | ICD-10-CM | POA: Diagnosis not present

## 2023-09-11 DIAGNOSIS — M6281 Muscle weakness (generalized): Secondary | ICD-10-CM | POA: Insufficient documentation

## 2023-09-11 DIAGNOSIS — R32 Unspecified urinary incontinence: Secondary | ICD-10-CM | POA: Insufficient documentation

## 2023-09-11 DIAGNOSIS — M5441 Lumbago with sciatica, right side: Secondary | ICD-10-CM | POA: Insufficient documentation

## 2023-09-11 DIAGNOSIS — R2689 Other abnormalities of gait and mobility: Secondary | ICD-10-CM | POA: Insufficient documentation

## 2023-09-11 DIAGNOSIS — M545 Low back pain, unspecified: Secondary | ICD-10-CM | POA: Diagnosis present

## 2023-09-11 DIAGNOSIS — R29898 Other symptoms and signs involving the musculoskeletal system: Secondary | ICD-10-CM | POA: Insufficient documentation

## 2023-09-11 DIAGNOSIS — M5459 Other low back pain: Secondary | ICD-10-CM | POA: Insufficient documentation

## 2023-09-11 LAB — URINALYSIS, ROUTINE W REFLEX MICROSCOPIC
Bacteria, UA: NONE SEEN
Bilirubin Urine: NEGATIVE
Glucose, UA: NEGATIVE mg/dL
Hgb urine dipstick: NEGATIVE
Ketones, ur: NEGATIVE mg/dL
Nitrite: NEGATIVE
Protein, ur: 30 mg/dL — AB
Specific Gravity, Urine: 1.026 (ref 1.005–1.030)
pH: 5.5 (ref 5.0–8.0)

## 2023-09-11 NOTE — ED Triage Notes (Signed)
 Pt was advised by sports medicine doctor today to come for UTI eval r/t back pain & urinary incontinence. Two occurences of incontinence- Friday & Monday. Denies numbness/ tingling.

## 2023-09-11 NOTE — Therapy (Signed)
 OUTPATIENT PHYSICAL THERAPY THORACOLUMBAR TREATMENT   Patient Name: Christina Henderson MRN: [id number] DOB:19-Apr-1936, 88 y.o., female Today's Date: 09/11/2023  END OF SESSION:  PT End of Session - 09/11/23 0933     Visit Number 8    Number of Visits 16    Date for PT Re-Evaluation 09/26/23    Authorization Type Medicare    Progress Note Due on Visit 10    PT Start Time 0931    PT Stop Time 1010    PT Time Calculation (min) 39 min    Activity Tolerance Patient tolerated treatment well    Behavior During Therapy [facility] for tasks assessed/performed              Past Medical History:  Diagnosis Date   Arthritis    rhuematoid arthritis   Breast cancer (HCC) 2018   Right Breast Cancer   Cancer (HCC) 03/2017   right breast cancer   Complication of anesthesia    hard to wake up   GERD (gastroesophageal reflux disease)    food related   Glaucoma    Hypertension    Osteoporosis    osteopenia   Rosacea    Past Surgical History:  Procedure Laterality Date   APPENDECTOMY     BACK SURGERY     BREAST BIOPSY     BREAST LUMPECTOMY Right 04/2017   BREAST LUMPECTOMY WITH RADIOACTIVE SEED AND SENTINEL LYMPH NODE BIOPSY Bilateral 05/09/2017   Procedure: BILATERAL RADIOACTIVE SEED GUIDED LUMPECTOMIES WITH RIGHT SENTINEL LYMPH NODE BIOPSY;  Surgeon: Harriette Bouillon, MD;  Location: [facility];  Service: General;  Laterality: Bilateral;   EYE SURGERY     cataract   SPINE SURGERY     lumbar lamenectomy   TONSILLECTOMY     Patient Active Problem List   Diagnosis Date Noted   SOB (shortness of breath) 02/15/2023   Bradycardia 08/15/2022   Dyslipidemia 11/25/2019   Educated about COVID-19 virus infection 11/25/2019   Chest pain 07/17/2018   Cough 07/17/2018   Essential hypertension 07/17/2018   Malignant neoplasm of upper-outer quadrant of right breast in female, estrogen receptor positive (HCC) 05/23/2017    PCP: Gweneth Dimitri MD  REFERRING PROVIDER:  Hurman Horn, MD  REFERRING DIAG: M54.50 (ICD-10-CM) - Low back pain, unspecified; bilateral hip pain  Rationale for Evaluation and Treatment: Rehabilitation  THERAPY DIAG:  Other low back pain  Muscle weakness (generalized)  Other abnormalities of gait and mobility  ONSET DATE: about 6 weeks  SUBJECTIVE:  SUBJECTIVE STATEMENT: Pt reports that she is interested in some updated land exercises.  She stopped doing the hip stretch that hurt (piriformis) and is just marching around house, and side stepping with soup cans.  Continues to go to pool ~5 days/ wk and swim/ perform aquatic HEP.     From initial evaluation:  Patient states she does swimming and MD wanted her to build up bones up last year with PT. Now back has been painful. Pain began at least 6 weeks ago with insidious onset. Notes pain with walking, transfers, sitting. Patient stating pain in low back, bilateral hips, and hamstrings. Symptoms improve with more walking. Sitting for a long time 30-50 minutes and then when she gets up it hurts and so does after getting up in the morning.   PERTINENT HISTORY:  Hx cancer, osteopenia, HTN, 1974 back surgery   PAIN:  Are you having pain? no Location: lower back at beltline NPRS: "not pain, just discomfort"    PRECAUTIONS: None  WEIGHT BEARING RESTRICTIONS: No  FALLS:  Has patient fallen in last 6 months? No   PLOF: Independent  PATIENT GOALS: eliminate the pain   OBJECTIVE: (objective measures from initial evaluation unless otherwise dated)   PATIENT SURVEYS:  FOTO 65% function  SCREENING FOR RED FLAGS: Bowel or bladder incontinence: No Spinal tumors: No Cauda equina syndrome: No Compression fracture: No Abdominal aneurysm: No  COGNITION: Overall cognitive status: Within  functional limits for tasks assessed     SENSATION: WFL    PALPATION: No tenderness in lumbar spine. Tender at L greater Trochanter and R lateral hamstrings  LUMBAR ROM:   AROM eval  Flexion 25% limited (posterior LE "tight")  Extension 25% limited   Right lateral flexion 25% limited   Left lateral flexion 25% limited   Right rotation   Left rotation    (Blank rows = not tested) * = pain/symptoms  LOWER EXTREMITY ROM:   decreased hip extension bilaterally  Active  Right eval Left eval  Hip flexion    Hip extension    Hip abduction    Hip adduction    Hip internal rotation    Hip external rotation    Knee flexion    Knee extension    Ankle dorsiflexion    Ankle plantarflexion    Ankle inversion    Ankle eversion     (Blank rows = not tested) * = pain/symptoms  LOWER EXTREMITY MMT:    MMT Right eval Left eval  Hip flexion 4+ 4+  Hip extension 3+ (ROM only to neutral) 3+ (ROM only to neutral)  Hip abduction 4- 4-  Hip adduction    Hip internal rotation    Hip external rotation    Knee flexion 5 5  Knee extension 5 5  Ankle dorsiflexion 5 5  Ankle plantarflexion    Ankle inversion    Ankle eversion     (Blank rows = not tested) * = pain/symptoms    FUNCTIONAL TESTS:  5 times sit to stand: 12.32 seconds without UE, support, dynamic knee valgus bilaterally, decreased eccentric control.   08/25/23: 5x STS - 11.07s  GAIT: Distance walked: 100 feet Assistive device utilized: None Level of assistance: Complete Independence Comments: WFL  TODAY'S TREATMENT:  DATE:  09/11/23 Pt seen for aquatic therapy today.  Treatment took place in water 3.5-4.75 ft in depth at the [facility] pool. Temp of water was 91.  Pt entered/exited the pool via stairs independently with bilat rail. - unsupported walking forward/ backward  -Side  stepping with arm addct/ abdct with rainbow hand floats - UE on wall: hip ext x 10; hip abdct/ addct x 10 - staggered stance with kick board row x 10 each; repeated with vectors (11 and 1o'clock) x 10 each  - SLS with opp arm motions with hollow noodle under water  - balance on hollow noodle (like skate board) with intermittent UE to steady -> marching (challenge!) - squats pulling single yellow hand float under water x 15 - TrA set with hollow noodle pull down to thighs x 20 (cues for technique and to slow down)  - 3 way LE stretch with hollow blue noodle (ITB, hamstrings, adductor stretches) at ankle x 15s each position - warrior 3 - with yellow hand floats x 8 each - cues for technique and to slow down   09/04/23 Pt seen for aquatic therapy today.  Treatment took place in water 3.5-4.75 ft in depth at the [facility] pool. Temp of water was 91.  Pt entered/exited the pool via stairs independently with bilat rail. - unsupported walking forward/ backward with cues for vertical trunk -Side stepping with arm addct/ abdct with rainbow hand floats - plank with yellow hand floats ->trial with leg lifts (challenge) and superman to/from plank (difficult) - TrA set with hollow noodle pull down to thighs, x 10 wide stance, x 5 staggered stance, x 5 in wide stance with 4 sec release to surface  - UE on wall:  single leg clams x 10 - - 3 way LE stretch with hollow blue noodle (ITB, hamstrings, adductor stretches) at ankle x 15s each position - squats pulling single yellow hand float under water x 10 (cues to allow heels to come up) - farmer carry with single yellow hand floats, walking forward/ backward  - straddling noodle and UE on corner: cycling  08/29/23 Pt seen for aquatic therapy today.  Treatment took place in water 3.5-4.75 ft in depth at the [facility] pool. Temp of water was 91.  Pt entered/exited the pool via stairs independently with bilat rail. - unsupported walking  forward/ backward with cues for vertical trunk - wide stance - reciprocal arm swing with light resistance bells, horiz abdct/addct (under water) with bells -> walking forward/ backward with recipocal arm swing with green bells -Side stepping with arm addct/ abdct with rainbow hand floats - farmer carry with bilat rainbow hand floats, walking forward/ backward -> single rainbow - UE on rainbow hand floats- single hand against wall:  hip abdct/ addct, crossing midline x 15 each;  leg swings into hip flexion/ext x 15 each engage core  - UE on rainbow hand floats:  tandem gait forward/ backward x 2 laps, cues to slow speed - squats pulling single yellow hand float under water 2 x 10 - TrA set with solid -> hollow noodle pull down to thighs, 4 sec release to surface x 15  - straddling noodle and UE on corner: cycling, hip abdct/ addct and cross country ski - 3 way LE stretch with hollow blue noodle (ITB, hamstrings, adductor stretches) at ankle x 15s each position   [facility] Adult PT Treatment:  DATE: 08/25/23  Therapeutic Exercise: NuStep L4: UE/LE x 5 min for warm up UE on counter:  hip ext x 10 each;  Hip abdct x 10 each;  heel toe raises x 10  Seated hamstring stretch with DF x 15s x 2 each LE  5x STS ( see above)  Seated pirirformis stretch 2 versions x 15s each Walking 300 ft  Self Care: Reviewed body mechanics for changing cat's water bowl (bend knees/hips and not just from back); pt returned demo  Educated on self massage with stick roller to buttocks and LEs; pt returned demo  DATE:  08/21/23 Pt seen for aquatic therapy today.  Treatment took place in water 3.5-4.75 ft in depth at the [facility] pool. Temp of water was 91.  Pt entered/exited the pool via stairs independently with bilat rail. - unsupported walking forward/ backward with cues for vertical trunk -Side stepping with arm addct/ abdct with rainbow hand floats - farmer  carry with bilat rainbow hand floats, walking forward/ backward -> single yellow  - marching with reciprocal arm motion with rainbow hand floats - UE on yellow hand floats hip abdct/ addct x 10 each;  leg swings into hip flexion/ext x 10 each - cues to slow speed and engage core x 10 each LE - UE on wall: hip abdct/ addct x 10 each;  leg swings into hip flexion/ext x 10 each; single leg clams x 10 each  - TrA set with single rainbow hand floats pull downs to thighs x 10 - squats pulling single yellow hand float under water x 10 - straddling noodle and UE on corner: cycling, hip abdct/ addct and cross country ski - 3 way LE stretch with hollow blue noodle (ITB, hamstrings, adductor stretches) at ankle x 15s each position   08/18/23 Pt seen for aquatic therapy today.  Treatment took place in water 3.5-4.75 ft in depth at the [facility] pool. Temp of water was 91.  Pt entered/exited the pool via stairs independently with bilat rail. - unsupported walking forward/ backward with cues for vertical trunk - farmer carry with bilat rainbow hand floats, walking forward/ backward -> single yellow  - marching with reciprocal arm motion with rainbow hand floats - UE on rainbow hand floats 3 way LE kicks - cues to slow speed and engage core x 10 each LE - UE on yellow hand floats, 3 way toe taps x 5 each LE; heel raises x 12 - TrA set with short hollow noodle pull downs to thighs x 8 -> single rainbow hand floats x 10 - UE on wall: hip abdct/ addct x 10 each;  leg swings into hip flexion/ext x 10 each - straddling noodle and UE on corner: cycling, hip abdct/ addct and cross country ski   08/16/23 NuStep L4: UE/LE x 5 min for warm up Seated hamstring stretch x15s x 2 each LE Seated piriformis stretch x 15s x 2 each LE Bridge - painful - stopped after 2 Standing resisted shoulder ext with core engaged, red band x 15;  resisted bilat row with red band x 15 STS with cues for forward arm reach, hip  hinge, core engaged 2 x 5 Trunk ext (limited tolerance past neutral)    PATIENT EDUCATION:  Education details: exercise rationale, modifications, progressions Person educated: Patient Education method: Programmer, multimedia, Demonstration,  Education comprehension: verbalized understanding, returned demonstration, verbal cues required, and tactile cues required  HOME EXERCISE PROGRAM: Land Access Code: [email or web address] URL: [email or web address]    AQUATIC Access  Code: [email or web address] URL: [email or web address] Date: 08/21/2023   Prepared by: [facility] [address] This aquatic home exercise program from Med[employer] utilizes pictures from land based exercises, but has been adapted prior to lamination and issuance.   ASSESSMENT:  CLINICAL IMPRESSION: Trialed some new balance exercises; reports of increased tightness in Lt hip with Lt SLS. She requires frequent cues to slow the speed of exercise. Modified current aquatic HEP and plan to reissue this next pool visit.   Therapist on land to update HEP for land next session and complete progress note (including FOTO).   From initial evaluation:  Patient a 88 y.o. y.o. female who was seen today for physical therapy evaluation and treatment for low back pain. Patient presents with pain limited deficits in lumbar spine strength, ROM, endurance, activity tolerance, and functional mobility with ADL. Patient is having to modify and restrict ADL as indicated by outcome measure score as well as subjective information and objective measures which is affecting overall participation. Patient will benefit from skilled physical therapy in order to improve function and reduce impairment.  OBJECTIVE IMPAIRMENTS: decreased activity tolerance, decreased balance, decreased mobility, difficulty walking, decreased ROM, decreased strength, improper body mechanics, and pain.   ACTIVITY LIMITATIONS: carrying, lifting, bending, sitting,  standing, squatting, locomotion level, and caring for others  PARTICIPATION LIMITATIONS: meal prep, cleaning, laundry, shopping, and community activity  PERSONAL FACTORS: Age, Time since onset of injury/illness/exacerbation, and 3+ comorbidities: chronic back pain, Hx cancer, osteopenia, HTN  are also affecting patient's functional outcome.   REHAB POTENTIAL: Good  CLINICAL DECISION MAKING: Stable/uncomplicated  EVALUATION COMPLEXITY: Low   GOALS: Goals reviewed with patient? Yes  SHORT TERM GOALS: Target date: 08/29/2023    Patient will be independent with HEP in order to improve functional outcomes. Baseline:  Goal status: In progress - 08/30/23  2.  Patient will report at least 25% improvement in symptoms for improved quality of life. Baseline:  Goal status:MET - 08/25/23    LONG TERM GOALS: Target date: 09/26/2023    Patient will report at least 75% improvement in symptoms for improved quality of life. Baseline:  Goal status:IN PROGRESS   2.  Patient will improve FOTO score to predicted outcomes in order to indicate improved tolerance to activity. Baseline: 65% function Goal status: INITIAL  3.  Patient will demonstrate at least 25% improvement in lumbar ROM in all restricted planes for improved ability to move trunk while completing chores. Baseline:  Goal status: INITIAL  4.  Patient will be able to complete 5x STS in under 11.4 seconds without compensation in order to reduce the risk of falls. Baseline: 12.32 seconds without UE, support, dynamic knee valgus bilaterally, decreased eccentric control. Goal status: MET - 08/25/23  5.  Patient will demonstrate grade of 4+/5 MMT grade in all tested musculature as evidence of improved strength to assist with stair ambulation and gait.   Baseline: see above Goal status: INITIAL     PLAN:  PT FREQUENCY: 1-2x/week  PT DURATION: 8 weeks  PLANNED INTERVENTIONS: 97164- PT Re-evaluation, 97110-Therapeutic exercises,  97530- Therapeutic activity, 97112- Neuromuscular re-education, 97535- Self Care, 47829- Manual therapy, [phone]- Gait training, [phone]- Orthotic Fit/training, [phone]- Canalith repositioning, [id number]- Aquatic Therapy, [phone]- Splinting, Patient/Family education, Balance training, Stair training, Taping, Dry Needling, Joint mobilization, Joint manipulation, Spinal manipulation, Spinal mobilization, Scar mobilization, and DME instructions.  PLAN FOR NEXT SESSION: f/u with HEP, core and glute strength, postural strength, lumbar, hip and hamstring mobility  Mayer Camel, PTA 09/11/23 10:57 AM  [facility] [address] [place], [place], [address] Phone: [phone]   Fax:  [phone]

## 2023-09-11 NOTE — ED Provider Notes (Addendum)
 [facility] AT [facility] Provider Note   CSN: [id number] Arrival date & time: 09/11/23  1520     History  Chief Complaint  Patient presents with   Back Pain    Christina Henderson is a 88 y.o. female.  Patient with a history of lumbar laminectomy in 1974 and chronic back pain with bilateral sciatica. She follows with Dr. Hurman Horn at [facility] for her back pain and gets regular physical therapy, most recently this morning. She saw Dr. Freida Busman today and mentioned two episodes of incontinence over the past few days: first on Friday 2/28 and then yesterday 3/2. She also endorses having to strain to urinate for the past several weeks, feeling as though she has to "squeeze" the urine out.  Dr. Freida Busman suggested that she present to the ED for evaluation given combination of low back pain and incontinence raising concern for UTI vs possible underlying cauda equina syndrome.  She denies any dysuria, urinary frequency or urgency, no suprapubic pain or flank pain.   She denies any saddle anesthesia, perineal numbness/tingling, LE weakness, or changes in her bowel control.   Home Medications Prior to Admission medications   Medication Sig Start Date End Date Taking? Authorizing Provider  amLODipine (NORVASC) 10 MG tablet Take 1 tablet (10 mg total) by mouth at bedtime. 08/17/22   Rollene Rotunda, MD  anastrozole (ARIMIDEX) 1 MG tablet Take 1 tablet (1 mg total) by mouth daily. 05/29/23   Serena Croissant, MD  candesartan (ATACAND) 32 MG tablet Take 32 mg by mouth daily. 11/07/22   [provider]  fluocinonide cream (LIDEX) 0.05 % Apply 1 application topically 2 (two) times daily. 06/10/21   [provider]  hydrALAZINE (APRESOLINE) 10 MG tablet Take by mouth 3 (three) times daily. 40 mg in AM, 40 mg at Lunch and 30 mg at [facility] 05/10/23   Rollene Rotunda, MD  hydroxychloroquine (PLAQUENIL) 200 MG tablet Take 2 tablets (400 mg total) by mouth daily.  05/26/20   Serena Croissant, MD  Multiple Vitamin (MULTIVITAMIN WITH MINERALS) TABS tablet Take 1 tablet by mouth daily.    [provider]  Probiotic Product (PROBIOTIC ACIDOPHILUS BIOBEADS) CAPS Take by mouth daily.     [provider]  rosuvastatin (CRESTOR) 10 MG tablet Take 10 mg by mouth daily.    [provider]  spironolactone (ALDACTONE) 25 MG tablet Take 1 tablet (25 mg total) by mouth daily. 12/07/22 03/07/23  Rollene Rotunda, MD      Allergies    Caffeine, Statins, and Sulfa antibiotics    Review of Systems   Review of Systems  Constitutional:  Negative for chills and fever.  Genitourinary:  Negative for dysuria and frequency.  Musculoskeletal:  Positive for back pain (with bilateral sciatica).  Neurological:  Negative for weakness and numbness.    Physical Exam Updated Vital Signs BP 137/78   Pulse 69   Temp 98 F (36.7 C)   Resp 18   SpO2 95%  Physical Exam Cardiovascular:     Rate and Rhythm: Normal rate and regular rhythm.  Pulmonary:     Effort: Pulmonary effort is normal.  Genitourinary:    Comments: Rectal exam deferred after shared decision-making with patient Musculoskeletal:     Comments: There is no midline tenderness along the lumbar spine. There is no significant paraspinal tenderness or muscle spasm. Her SI joints are non-tender. She has FROM about the bilateral hips with good strength.  She has good pulses  in the distal lower extremities bilaterally.   Skin:    General: Skin is warm and dry.  Neurological:     Comments: Patient is A&Ox3 with intact strength and sensation throughout the upper and lower extremities.  2+ Patellar and Achilles reflexes bilaterally.   Psychiatric:        Mood and Affect: Mood and affect normal.     ED Results / Procedures / Treatments   Labs (all labs ordered are listed, but only abnormal results are displayed) Labs Reviewed  URINALYSIS, ROUTINE W REFLEX MICROSCOPIC - Abnormal; Notable for  the following components:      Result Value   Protein, ur 30 (*)    Leukocytes,Ua TRACE (*)    All other components within normal limits    EKG None  Radiology No results found.  Procedures Procedures   Medications Ordered in ED Medications - No data to display  ED Course/ Medical Decision Making/ A&P    Medical Decision Making Patient coming in with new onset urinary incontinence on top of her chronic low back pain with sciatica and history of lumbar laminectomy. Her urine did show trace leukocytes, but in the absence of UTI symptoms, doubt this represents UTI and do not see an indication to treat.  More concerning is the possibility of underlying cauda equina syndrome. While she does not have saddle anesthesia or LE weakness and does have intact LE reflexes, the new onset of urinary incontinence with multiple episodes in a short interval is concerning.  Her PVR was only 13mL, suggesting against urinary retention and overflow incontinence.  Discussed with the patient our recommendation to obtain and MRI to rule out cauda equina. Dr. Wilkie Aye accepted patient as a transfer to [facility] for MRI but patient prefers to sign out AMA. She is plugged in with sports medicine and her PCP and feels that she can pursue this workup as an outpatient. Reviewed the risks and emergent nature of cauda equina as a possible driver here and she affirms her decision to leave AMA. She is alert, oriented and of sound mind and able to articulate the risks at the time of discharge.   Amount and/or Complexity of Data Reviewed Labs: ordered.    Final Clinical Impression(s) / ED Diagnoses Final diagnoses:  Chronic bilateral low back pain with bilateral sciatica  Urinary incontinence, unspecified type    Rx / DC Orders ED Discharge Orders     None      Eliezer Mccoy, MD 09/11/23 11:43 PM    Alicia Amel, MD 09/11/23 Ouida Sills    Alicia Amel, MD 09/12/23 0758    Franne Forts,  DO 09/16/23 1728

## 2023-09-11 NOTE — Discharge Instructions (Addendum)
 Ms. Huxtable,  As we discussed, while your exam is overall reassuring, we cannot definitively rule out cauda equina syndrome as an underlying cause of your urinary incontinence without obtaining an MRI. Since you are electing not to transfer to [facility] to have this done tonight, I do recommend that you discuss this further with Dr. Freida Busman or your PCP to have this ordered on an outpatient basis. OR, of course, you can always come back to the ER to have this done emergently.   Eliezer Mccoy, MD

## 2023-09-13 ENCOUNTER — Encounter (HOSPITAL_BASED_OUTPATIENT_CLINIC_OR_DEPARTMENT_OTHER): Payer: Self-pay | Admitting: Physical Therapy

## 2023-09-13 ENCOUNTER — Ambulatory Visit (HOSPITAL_BASED_OUTPATIENT_CLINIC_OR_DEPARTMENT_OTHER): Payer: Medicare Other | Admitting: Physical Therapy

## 2023-09-13 DIAGNOSIS — M5459 Other low back pain: Secondary | ICD-10-CM | POA: Diagnosis not present

## 2023-09-13 DIAGNOSIS — M6281 Muscle weakness (generalized): Secondary | ICD-10-CM

## 2023-09-13 DIAGNOSIS — R29898 Other symptoms and signs involving the musculoskeletal system: Secondary | ICD-10-CM

## 2023-09-13 DIAGNOSIS — R2689 Other abnormalities of gait and mobility: Secondary | ICD-10-CM | POA: Diagnosis not present

## 2023-09-13 NOTE — Therapy (Signed)
 OUTPATIENT PHYSICAL THERAPY THORACOLUMBAR TREATMENT   Patient Name: Christina Henderson MRN: [id number] DOB:September 06, 1935, 88 y.o., female Today's Date: 09/13/2023  END OF SESSION:  PT End of Session - 09/13/23 0848     Visit Number 9    Number of Visits 16    Date for PT Re-Evaluation 09/26/23    Authorization Type Medicare    Progress Note Due on Visit 10    PT Start Time 0848    PT Stop Time 0928    PT Time Calculation (min) 40 min    Activity Tolerance Patient tolerated treatment well    Behavior During Therapy [facility] for tasks assessed/performed              Past Medical History:  Diagnosis Date   Arthritis    rhuematoid arthritis   Breast cancer (HCC) 2018   Right Breast Cancer   Cancer (HCC) 03/2017   right breast cancer   Complication of anesthesia    hard to wake up   GERD (gastroesophageal reflux disease)    food related   Glaucoma    Hypertension    Osteoporosis    osteopenia   Rosacea    Past Surgical History:  Procedure Laterality Date   APPENDECTOMY     BACK SURGERY     BREAST BIOPSY     BREAST LUMPECTOMY Right 04/2017   BREAST LUMPECTOMY WITH RADIOACTIVE SEED AND SENTINEL LYMPH NODE BIOPSY Bilateral 05/09/2017   Procedure: BILATERAL RADIOACTIVE SEED GUIDED LUMPECTOMIES WITH RIGHT SENTINEL LYMPH NODE BIOPSY;  Surgeon: Harriette Bouillon, MD;  Location: [facility];  Service: General;  Laterality: Bilateral;   EYE SURGERY     cataract   SPINE SURGERY     lumbar lamenectomy   TONSILLECTOMY     Patient Active Problem List   Diagnosis Date Noted   SOB (shortness of breath) 02/15/2023   Bradycardia 08/15/2022   Dyslipidemia 11/25/2019   Educated about COVID-19 virus infection 11/25/2019   Chest pain 07/17/2018   Cough 07/17/2018   Essential hypertension 07/17/2018   Malignant neoplasm of upper-outer quadrant of right breast in female, estrogen receptor positive (HCC) 05/23/2017    PCP: Gweneth Dimitri MD  REFERRING PROVIDER:  Hurman Horn, MD  REFERRING DIAG: M54.50 (ICD-10-CM) - Low back pain, unspecified; bilateral hip pain  Rationale for Evaluation and Treatment: Rehabilitation  THERAPY DIAG:  Other low back pain  Muscle weakness (generalized)  Other abnormalities of gait and mobility  Other symptoms and signs involving the musculoskeletal system  ONSET DATE: about 6 weeks  SUBJECTIVE:  SUBJECTIVE STATEMENT: Pt reports feeling better than last time. Symptoms only into upper glutes/low back at this point.    From initial evaluation:  Patient states she does swimming and MD wanted her to build up bones up last year with PT. Now back has been painful. Pain began at least 6 weeks ago with insidious onset. Notes pain with walking, transfers, sitting. Patient stating pain in low back, bilateral hips, and hamstrings. Symptoms improve with more walking. Sitting for a long time 30-50 minutes and then when she gets up it hurts and so does after getting up in the morning.   PERTINENT HISTORY:  Hx cancer, osteopenia, HTN, 1974 back surgery   PAIN:  Are you having pain? no Location: lower back at beltline NPRS: "not pain, just discomfort"    PRECAUTIONS: None  WEIGHT BEARING RESTRICTIONS: No  FALLS:  Has patient fallen in last 6 months? No   PLOF: Independent  PATIENT GOALS: eliminate the pain   OBJECTIVE: (objective measures from initial evaluation unless otherwise dated)   PATIENT SURVEYS:  FOTO 65% function  SCREENING FOR RED FLAGS: Bowel or bladder incontinence: No Spinal tumors: No Cauda equina syndrome: No Compression fracture: No Abdominal aneurysm: No  COGNITION: Overall cognitive status: Within functional limits for tasks assessed     SENSATION: WFL    PALPATION: No tenderness in  lumbar spine. Tender at L greater Trochanter and R lateral hamstrings  LUMBAR ROM:   AROM eval  Flexion 25% limited (posterior LE "tight")  Extension 25% limited   Right lateral flexion 25% limited   Left lateral flexion 25% limited   Right rotation   Left rotation    (Blank rows = not tested) * = pain/symptoms  LOWER EXTREMITY ROM:   decreased hip extension bilaterally  Active  Right eval Left eval  Hip flexion    Hip extension    Hip abduction    Hip adduction    Hip internal rotation    Hip external rotation    Knee flexion    Knee extension    Ankle dorsiflexion    Ankle plantarflexion    Ankle inversion    Ankle eversion     (Blank rows = not tested) * = pain/symptoms  LOWER EXTREMITY MMT:    MMT Right eval Left eval  Hip flexion 4+ 4+  Hip extension 3+ (ROM only to neutral) 3+ (ROM only to neutral)  Hip abduction 4- 4-  Hip adduction    Hip internal rotation    Hip external rotation    Knee flexion 5 5  Knee extension 5 5  Ankle dorsiflexion 5 5  Ankle plantarflexion    Ankle inversion    Ankle eversion     (Blank rows = not tested) * = pain/symptoms    FUNCTIONAL TESTS:  5 times sit to stand: 12.32 seconds without UE, support, dynamic knee valgus bilaterally, decreased eccentric control.   08/25/23: 5x STS - 11.07s  GAIT: Distance walked: 100 feet Assistive device utilized: None Level of assistance: Complete Independence Comments: WFL  TODAY'S TREATMENT:  DATE:  09/13/23 DKTC with heels on swiss ball 10 x 5 second holds Supine dead bug isometric 10 x 5 second holds Ab set 5 x 5 second holds March with ab set 2 x 10 Standing hip abduction RTB at knees 2 x 10 Standing hip extension RTB at knees 2 x 10 Standing Row RTB 2 x 15 Standing shoulder extension RTB 2 x 15 Seated trunk flexion stretch with ball 10 x 5-10 second  holds SLS 2 x 20 second holds  09/11/23 Pt seen for aquatic therapy today.  Treatment took place in water 3.5-4.75 ft in depth at the [facility] pool. Temp of water was 91.  Pt entered/exited the pool via stairs independently with bilat rail. - unsupported walking forward/ backward  -Side stepping with arm addct/ abdct with rainbow hand floats - UE on wall: hip ext x 10; hip abdct/ addct x 10 - staggered stance with kick board row x 10 each; repeated with vectors (11 and 1o'clock) x 10 each  - SLS with opp arm motions with hollow noodle under water  - balance on hollow noodle (like skate board) with intermittent UE to steady -> marching (challenge!) - squats pulling single yellow hand float under water x 15 - TrA set with hollow noodle pull down to thighs x 20 (cues for technique and to slow down)  - 3 way LE stretch with hollow blue noodle (ITB, hamstrings, adductor stretches) at ankle x 15s each position - warrior 3 - with yellow hand floats x 8 each - cues for technique and to slow down   09/04/23 Pt seen for aquatic therapy today.  Treatment took place in water 3.5-4.75 ft in depth at the [facility] pool. Temp of water was 91.  Pt entered/exited the pool via stairs independently with bilat rail. - unsupported walking forward/ backward with cues for vertical trunk -Side stepping with arm addct/ abdct with rainbow hand floats - plank with yellow hand floats ->trial with leg lifts (challenge) and superman to/from plank (difficult) - TrA set with hollow noodle pull down to thighs, x 10 wide stance, x 5 staggered stance, x 5 in wide stance with 4 sec release to surface  - UE on wall:  single leg clams x 10 - - 3 way LE stretch with hollow blue noodle (ITB, hamstrings, adductor stretches) at ankle x 15s each position - squats pulling single yellow hand float under water x 10 (cues to allow heels to come up) - farmer carry with single yellow hand floats, walking forward/  backward  - straddling noodle and UE on corner: cycling  08/29/23 Pt seen for aquatic therapy today.  Treatment took place in water 3.5-4.75 ft in depth at the [facility] pool. Temp of water was 91.  Pt entered/exited the pool via stairs independently with bilat rail. - unsupported walking forward/ backward with cues for vertical trunk - wide stance - reciprocal arm swing with light resistance bells, horiz abdct/addct (under water) with bells -> walking forward/ backward with recipocal arm swing with green bells -Side stepping with arm addct/ abdct with rainbow hand floats - farmer carry with bilat rainbow hand floats, walking forward/ backward -> single rainbow - UE on rainbow hand floats- single hand against wall:  hip abdct/ addct, crossing midline x 15 each;  leg swings into hip flexion/ext x 15 each engage core  - UE on rainbow hand floats:  tandem gait forward/ backward x 2 laps, cues to slow speed - squats pulling single  yellow hand float under water 2 x 10 - TrA set with solid -> hollow noodle pull down to thighs, 4 sec release to surface x 15  - straddling noodle and UE on corner: cycling, hip abdct/ addct and cross country ski - 3 way LE stretch with hollow blue noodle (ITB, hamstrings, adductor stretches) at ankle x 15s each position   [facility] Adult PT Treatment:                                                DATE: 08/25/23  Therapeutic Exercise: NuStep L4: UE/LE x 5 min for warm up UE on counter:  hip ext x 10 each;  Hip abdct x 10 each;  heel toe raises x 10  Seated hamstring stretch with DF x 15s x 2 each LE  5x STS ( see above)  Seated pirirformis stretch 2 versions x 15s each Walking 300 ft  Self Care: Reviewed body mechanics for changing cat's water bowl (bend knees/hips and not just from back); pt returned demo  Educated on self massage with stick roller to buttocks and LEs; pt returned demo  DATE:  08/21/23 Pt seen for aquatic therapy today.  Treatment took  place in water 3.5-4.75 ft in depth at the [facility] pool. Temp of water was 91.  Pt entered/exited the pool via stairs independently with bilat rail. - unsupported walking forward/ backward with cues for vertical trunk -Side stepping with arm addct/ abdct with rainbow hand floats - farmer carry with bilat rainbow hand floats, walking forward/ backward -> single yellow  - marching with reciprocal arm motion with rainbow hand floats - UE on yellow hand floats hip abdct/ addct x 10 each;  leg swings into hip flexion/ext x 10 each - cues to slow speed and engage core x 10 each LE - UE on wall: hip abdct/ addct x 10 each;  leg swings into hip flexion/ext x 10 each; single leg clams x 10 each  - TrA set with single rainbow hand floats pull downs to thighs x 10 - squats pulling single yellow hand float under water x 10 - straddling noodle and UE on corner: cycling, hip abdct/ addct and cross country ski - 3 way LE stretch with hollow blue noodle (ITB, hamstrings, adductor stretches) at ankle x 15s each position   08/18/23 Pt seen for aquatic therapy today.  Treatment took place in water 3.5-4.75 ft in depth at the [facility] pool. Temp of water was 91.  Pt entered/exited the pool via stairs independently with bilat rail. - unsupported walking forward/ backward with cues for vertical trunk - farmer carry with bilat rainbow hand floats, walking forward/ backward -> single yellow  - marching with reciprocal arm motion with rainbow hand floats - UE on rainbow hand floats 3 way LE kicks - cues to slow speed and engage core x 10 each LE - UE on yellow hand floats, 3 way toe taps x 5 each LE; heel raises x 12 - TrA set with short hollow noodle pull downs to thighs x 8 -> single rainbow hand floats x 10 - UE on wall: hip abdct/ addct x 10 each;  leg swings into hip flexion/ext x 10 each - straddling noodle and UE on corner: cycling, hip abdct/ addct and cross country  ski   08/16/23 NuStep L4: UE/LE x 5 min  for warm up Seated hamstring stretch x15s x 2 each LE Seated piriformis stretch x 15s x 2 each LE Bridge - painful - stopped after 2 Standing resisted shoulder ext with core engaged, red band x 15;  resisted bilat row with red band x 15 STS with cues for forward arm reach, hip hinge, core engaged 2 x 5 Trunk ext (limited tolerance past neutral)    PATIENT EDUCATION:  Education details: exercise rationale, modifications, progressions Person educated: Patient Education method: Programmer, multimedia, Demonstration,  Education comprehension: verbalized understanding, returned demonstration, verbal cues required, and tactile cues required  HOME EXERCISE PROGRAM: Land Access Code: [email or web address] URL: [email or web address]    AQUATIC Access Code: [email or web address] URL: [email or web address] Date: 08/21/2023   Prepared by: [facility] This aquatic home exercise program from [employer] utilizes pictures from land based exercises, but has been adapted prior to lamination and issuance.   ASSESSMENT:  CLINICAL IMPRESSION: Performed core and glute strengthening today which is tolerated well. Continued and progressed resisted postural strengthening. Intermittent cueing provided for mechanics with good carry over. Patient will continue to benefit from physical therapy in order to improve function and reduce impairment.   From initial evaluation:  Patient a 88 y.o. y.o. female who was seen today for physical therapy evaluation and treatment for low back pain. Patient presents with pain limited deficits in lumbar spine strength, ROM, endurance, activity tolerance, and functional mobility with ADL. Patient is having to modify and restrict ADL as indicated by outcome measure score as well as subjective information and objective measures which is affecting overall participation. Patient will benefit from skilled physical therapy  in order to improve function and reduce impairment.  OBJECTIVE IMPAIRMENTS: decreased activity tolerance, decreased balance, decreased mobility, difficulty walking, decreased ROM, decreased strength, improper body mechanics, and pain.   ACTIVITY LIMITATIONS: carrying, lifting, bending, sitting, standing, squatting, locomotion level, and caring for others  PARTICIPATION LIMITATIONS: meal prep, cleaning, laundry, shopping, and community activity  PERSONAL FACTORS: Age, Time since onset of injury/illness/exacerbation, and 3+ comorbidities: chronic back pain, Hx cancer, osteopenia, HTN  are also affecting patient's functional outcome.   REHAB POTENTIAL: Good  CLINICAL DECISION MAKING: Stable/uncomplicated  EVALUATION COMPLEXITY: Low   GOALS: Goals reviewed with patient? Yes  SHORT TERM GOALS: Target date: 08/29/2023    Patient will be independent with HEP in order to improve functional outcomes. Baseline:  Goal status: In progress - 08/30/23  2.  Patient will report at least 25% improvement in symptoms for improved quality of life. Baseline:  Goal status:MET - 08/25/23    LONG TERM GOALS: Target date: 09/26/2023    Patient will report at least 75% improvement in symptoms for improved quality of life. Baseline:  Goal status:IN PROGRESS   2.  Patient will improve FOTO score to predicted outcomes in order to indicate improved tolerance to activity. Baseline: 65% function Goal status: INITIAL  3.  Patient will demonstrate at least 25% improvement in lumbar ROM in all restricted planes for improved ability to move trunk while completing chores. Baseline:  Goal status: INITIAL  4.  Patient will be able to complete 5x STS in under 11.4 seconds without compensation in order to reduce the risk of falls. Baseline: 12.32 seconds without UE, support, dynamic knee valgus bilaterally, decreased eccentric control. Goal status: MET - 08/25/23  5.  Patient will demonstrate grade of 4+/5 MMT  grade in all tested musculature as evidence of improved strength to assist with stair ambulation and gait.   Baseline:  see above Goal status: INITIAL     PLAN:  PT FREQUENCY: 1-2x/week  PT DURATION: 8 weeks  PLANNED INTERVENTIONS: [id number]- PT Re-evaluation, 97110-Therapeutic exercises, 97530- Therapeutic activity, 97112- Neuromuscular re-education, 97535- Self Care, [id number]- Manual therapy, [phone]- Gait training, [phone]- Orthotic Fit/training, [phone]- Canalith repositioning, [id number]- Aquatic Therapy, [phone]- Splinting, Patient/Family education, Balance training, Stair training, Taping, Dry Needling, Joint mobilization, Joint manipulation, Spinal manipulation, Spinal mobilization, Scar mobilization, and DME instructions.  PLAN FOR NEXT SESSION: f/u with HEP, core and glute strength, postural strength, lumbar, hip and hamstring mobility   Wyman Songster, PT 09/13/2023, 8:49 AM  [facility] [address] [place], [place], [address] Phone: [phone]   Fax:  [phone]

## 2023-09-18 ENCOUNTER — Ambulatory Visit (HOSPITAL_BASED_OUTPATIENT_CLINIC_OR_DEPARTMENT_OTHER): Payer: Medicare Other | Admitting: Physical Therapy

## 2023-09-18 ENCOUNTER — Encounter (HOSPITAL_BASED_OUTPATIENT_CLINIC_OR_DEPARTMENT_OTHER): Payer: Self-pay | Admitting: Physical Therapy

## 2023-09-18 DIAGNOSIS — R29898 Other symptoms and signs involving the musculoskeletal system: Secondary | ICD-10-CM | POA: Diagnosis not present

## 2023-09-18 DIAGNOSIS — R2689 Other abnormalities of gait and mobility: Secondary | ICD-10-CM

## 2023-09-18 DIAGNOSIS — M5459 Other low back pain: Secondary | ICD-10-CM | POA: Diagnosis not present

## 2023-09-18 DIAGNOSIS — M6281 Muscle weakness (generalized): Secondary | ICD-10-CM | POA: Diagnosis not present

## 2023-09-18 NOTE — Therapy (Signed)
 OUTPATIENT PHYSICAL THERAPY THORACOLUMBAR TREATMENT   Patient Name: Christina Henderson MRN: [id number] DOB:Aug 12, 1935, 88 y.o., female Today's Date: 09/18/2023 Progress Note   Reporting Period 08/01/23 to 09/18/23   See note below for Objective Data and Assessment of Progress/Goals   END OF SESSION:  PT End of Session - 09/18/23 0935     Visit Number 10    Number of Visits 16    Date for PT Re-Evaluation 09/26/23    Authorization Type Medicare    PT Start Time 0929    PT Stop Time 1008    PT Time Calculation (min) 39 min    Behavior During Therapy [facility] for tasks assessed/performed              Past Medical History:  Diagnosis Date   Arthritis    rhuematoid arthritis   Breast cancer (HCC) 2018   Right Breast Cancer   Cancer (HCC) 03/2017   right breast cancer   Complication of anesthesia    hard to wake up   GERD (gastroesophageal reflux disease)    food related   Glaucoma    Hypertension    Osteoporosis    osteopenia   Rosacea    Past Surgical History:  Procedure Laterality Date   APPENDECTOMY     BACK SURGERY     BREAST BIOPSY     BREAST LUMPECTOMY Right 04/2017   BREAST LUMPECTOMY WITH RADIOACTIVE SEED AND SENTINEL LYMPH NODE BIOPSY Bilateral 05/09/2017   Procedure: BILATERAL RADIOACTIVE SEED GUIDED LUMPECTOMIES WITH RIGHT SENTINEL LYMPH NODE BIOPSY;  Surgeon: Harriette Bouillon, MD;  Location: [facility];  Service: General;  Laterality: Bilateral;   EYE SURGERY     cataract   SPINE SURGERY     lumbar lamenectomy   TONSILLECTOMY     Patient Active Problem List   Diagnosis Date Noted   SOB (shortness of breath) 02/15/2023   Bradycardia 08/15/2022   Dyslipidemia 11/25/2019   Educated about COVID-19 virus infection 11/25/2019   Chest pain 07/17/2018   Cough 07/17/2018   Essential hypertension 07/17/2018   Malignant neoplasm of upper-outer quadrant of right breast in female, estrogen receptor positive (HCC) 05/23/2017    PCP: Gweneth Dimitri MD  REFERRING PROVIDER: Hurman Horn, MD  REFERRING DIAG: M54.50 (ICD-10-CM) - Low back pain, unspecified; bilateral hip pain  Rationale for Evaluation and Treatment: Rehabilitation  THERAPY DIAG:  Other low back pain  Muscle weakness (generalized)  Other abnormalities of gait and mobility  ONSET DATE: about 6 weeks  SUBJECTIVE:  SUBJECTIVE STATEMENT: Pt reports she has some soreness immediately after completing HEP; resolves in 5 min.     From initial evaluation:  Patient states she does swimming and MD wanted her to build up bones up last year with PT. Now back has been painful. Pain began at least 6 weeks ago with insidious onset. Notes pain with walking, transfers, sitting. Patient stating pain in low back, bilateral hips, and hamstrings. Symptoms improve with more walking. Sitting for a long time 30-50 minutes and then when she gets up it hurts and so does after getting up in the morning.   PERTINENT HISTORY:  Hx cancer, osteopenia, HTN, 1974 back surgery   PAIN:  Are you having pain? yes Location: low back to prox post thighs NPRS: 2/10    PRECAUTIONS: None  WEIGHT BEARING RESTRICTIONS: No  FALLS:  Has patient fallen in last 6 months? No   PLOF: Independent  PATIENT GOALS: eliminate the pain   OBJECTIVE: (objective measures from initial evaluation unless otherwise dated)   PATIENT SURVEYS:  FOTO 65% function 09/18/23:  60%   SCREENING FOR RED FLAGS: Bowel or bladder incontinence: No Spinal tumors: No Cauda equina syndrome: No Compression fracture: No Abdominal aneurysm: No  COGNITION: Overall cognitive status: Within functional limits for tasks assessed     SENSATION: WFL    PALPATION: No tenderness in lumbar spine. Tender at L greater Trochanter  and R lateral hamstrings  LUMBAR ROM:   AROM eval 09/18/23  Flexion 25% limited (posterior LE "tight") Finger tips to ankles   Extension 25% limited  10% limited  Right lateral flexion 25% limited  WNL  Left lateral flexion 25% limited  10% limited  Right rotation    Left rotation     (Blank rows = not tested) * = pain/symptoms  LOWER EXTREMITY ROM:   decreased hip extension bilaterally  Active  Right eval Left eval  Hip flexion    Hip extension    Hip abduction    Hip adduction    Hip internal rotation    Hip external rotation    Knee flexion    Knee extension    Ankle dorsiflexion    Ankle plantarflexion    Ankle inversion    Ankle eversion     (Blank rows = not tested) * = pain/symptoms  LOWER EXTREMITY MMT:    MMT Right eval Left eval  Hip flexion 4+ 4+  Hip extension 3+ (ROM only to neutral) 3+ (ROM only to neutral)  Hip abduction 4- 4-  Hip adduction    Hip internal rotation    Hip external rotation    Knee flexion 5 5  Knee extension 5 5  Ankle dorsiflexion 5 5  Ankle plantarflexion    Ankle inversion    Ankle eversion     (Blank rows = not tested) * = pain/symptoms    FUNCTIONAL TESTS:  5 times sit to stand: 12.32 seconds without UE, support, dynamic knee valgus bilaterally, decreased eccentric control.   08/25/23: 5x STS - 11.07s  GAIT: Distance walked: 100 feet Assistive device utilized: None Level of assistance: Complete Independence Comments: WFL  TODAY'S TREATMENT:  DATE:  09/18/23 Pt seen for aquatic therapy today.  Treatment took place in water 3.5-4.75 ft in depth at the [facility] pool. Temp of water was 91.  Pt entered/exited the pool via stairs independently with bilat rail  - unsupported walking forward/ backward with reciprocal arm swing, multiple laps -Side stepping with arm addct/ abdct with  yellow hand floats - UE on wall: hip ext x 10; hip abdct/ addct x 10 cues for vertical trunk - squats pulling single yellow hand float under water x 10, holding 3-4 sec under water - staggered stance with kick board row x 10 each; repeated with vectors (11 and 1o'clock) x 15 each  - TrA set with short -> long hollow noodle pull down to thighs 2 x 10  - warrior 3 - with yellow hand floats x 10 each - cues for technique and to slow down - warrior 1 - noodle lifts off of water x 3 each side ('feels it in back")  - cycling on yellow noodle    09/13/23 DKTC with heels on swiss ball 10 x 5 second holds Supine dead bug isometric 10 x 5 second holds Ab set 5 x 5 second holds March with ab set 2 x 10 Standing hip abduction RTB at knees 2 x 10 Standing hip extension RTB at knees 2 x 10 Standing Row RTB 2 x 15 Standing shoulder extension RTB 2 x 15 Seated trunk flexion stretch with ball 10 x 5-10 second holds SLS 2 x 20 second holds  09/11/23 Pt seen for aquatic therapy today.  Treatment took place in water 3.5-4.75 ft in depth at the [facility] pool. Temp of water was 91.  Pt entered/exited the pool via stairs independently with bilat rail. - unsupported walking forward/ backward  -Side stepping with arm addct/ abdct with rainbow hand floats - UE on wall: hip ext x 10; hip abdct/ addct x 10 - staggered stance with kick board row x 10 each; repeated with vectors (11 and 1o'clock) x 10 each  - SLS with opp arm motions with hollow noodle under water  - balance on hollow noodle (like skate board) with intermittent UE to steady -> marching (challenge!) - squats pulling single yellow hand float under water x 15 - TrA set with hollow noodle pull down to thighs x 20 (cues for technique and to slow down)  - 3 way LE stretch with hollow blue noodle (ITB, hamstrings, adductor stretches) at ankle x 15s each position - warrior 3 - with yellow hand floats x 8 each - cues for technique and to slow  down   09/04/23 Pt seen for aquatic therapy today.  Treatment took place in water 3.5-4.75 ft in depth at the [facility] pool. Temp of water was 91.  Pt entered/exited the pool via stairs independently with bilat rail. - unsupported walking forward/ backward with cues for vertical trunk -Side stepping with arm addct/ abdct with rainbow hand floats - plank with yellow hand floats ->trial with leg lifts (challenge) and superman to/from plank (difficult) - TrA set with hollow noodle pull down to thighs, x 10 wide stance, x 5 staggered stance, x 5 in wide stance with 4 sec release to surface  - UE on wall:  single leg clams x 10 - - 3 way LE stretch with hollow blue noodle (ITB, hamstrings, adductor stretches) at ankle x 15s each position - squats pulling single yellow hand float under water x 10 (cues to allow heels to come  up) - farmer carry with single yellow hand floats, walking forward/ backward  - straddling noodle and UE on corner: cycling  08/29/23 Pt seen for aquatic therapy today.  Treatment took place in water 3.5-4.75 ft in depth at the [facility] pool. Temp of water was 91.  Pt entered/exited the pool via stairs independently with bilat rail. - unsupported walking forward/ backward with cues for vertical trunk - wide stance - reciprocal arm swing with light resistance bells, horiz abdct/addct (under water) with bells -> walking forward/ backward with recipocal arm swing with green bells -Side stepping with arm addct/ abdct with rainbow hand floats - farmer carry with bilat rainbow hand floats, walking forward/ backward -> single rainbow - UE on rainbow hand floats- single hand against wall:  hip abdct/ addct, crossing midline x 15 each;  leg swings into hip flexion/ext x 15 each engage core  - UE on rainbow hand floats:  tandem gait forward/ backward x 2 laps, cues to slow speed - squats pulling single yellow hand float under water 2 x 10 - TrA set with solid ->  hollow noodle pull down to thighs, 4 sec release to surface x 15  - straddling noodle and UE on corner: cycling, hip abdct/ addct and cross country ski - 3 way LE stretch with hollow blue noodle (ITB, hamstrings, adductor stretches) at ankle x 15s each position   [facility] Adult PT Treatment:                                                DATE: 08/25/23  Therapeutic Exercise: NuStep L4: UE/LE x 5 min for warm up UE on counter:  hip ext x 10 each;  Hip abdct x 10 each;  heel toe raises x 10  Seated hamstring stretch with DF x 15s x 2 each LE  5x STS ( see above)  Seated pirirformis stretch 2 versions x 15s each Walking 300 ft  Self Care: Reviewed body mechanics for changing cat's water bowl (bend knees/hips and not just from back); pt returned demo  Educated on self massage with stick roller to buttocks and LEs; pt returned demo  DATE:  08/21/23 Pt seen for aquatic therapy today.  Treatment took place in water 3.5-4.75 ft in depth at the [facility] pool. Temp of water was 91.  Pt entered/exited the pool via stairs independently with bilat rail. - unsupported walking forward/ backward with cues for vertical trunk -Side stepping with arm addct/ abdct with rainbow hand floats - farmer carry with bilat rainbow hand floats, walking forward/ backward -> single yellow  - marching with reciprocal arm motion with rainbow hand floats - UE on yellow hand floats hip abdct/ addct x 10 each;  leg swings into hip flexion/ext x 10 each - cues to slow speed and engage core x 10 each LE - UE on wall: hip abdct/ addct x 10 each;  leg swings into hip flexion/ext x 10 each; single leg clams x 10 each  - TrA set with single rainbow hand floats pull downs to thighs x 10 - squats pulling single yellow hand float under water x 10 - straddling noodle and UE on corner: cycling, hip abdct/ addct and cross country ski - 3 way LE stretch with hollow blue noodle (ITB, hamstrings, adductor stretches) at ankle x 15s  each position   08/18/23  Pt seen for aquatic therapy today.  Treatment took place in water 3.5-4.75 ft in depth at the [facility] pool. Temp of water was 91.  Pt entered/exited the pool via stairs independently with bilat rail. - unsupported walking forward/ backward with cues for vertical trunk - farmer carry with bilat rainbow hand floats, walking forward/ backward -> single yellow  - marching with reciprocal arm motion with rainbow hand floats - UE on rainbow hand floats 3 way LE kicks - cues to slow speed and engage core x 10 each LE - UE on yellow hand floats, 3 way toe taps x 5 each LE; heel raises x 12 - TrA set with short hollow noodle pull downs to thighs x 8 -> single rainbow hand floats x 10 - UE on wall: hip abdct/ addct x 10 each;  leg swings into hip flexion/ext x 10 each - straddling noodle and UE on corner: cycling, hip abdct/ addct and cross country ski   08/16/23 NuStep L4: UE/LE x 5 min for warm up Seated hamstring stretch x15s x 2 each LE Seated piriformis stretch x 15s x 2 each LE Bridge - painful - stopped after 2 Standing resisted shoulder ext with core engaged, red band x 15;  resisted bilat row with red band x 15 STS with cues for forward arm reach, hip hinge, core engaged 2 x 5 Trunk ext (limited tolerance past neutral)    PATIENT EDUCATION:  Education details: exercise rationale, modifications, progressions Person educated: Patient Education method: Programmer, multimedia, Demonstration,  Education comprehension: verbalized understanding, returned demonstration, verbal cues required, and tactile cues required  HOME EXERCISE PROGRAM: Land Access Code: [email or web address] URL: [email or web address]    AQUATIC Access Code: [email or web address] URL: [email or web address] Date: 08/21/2023   Prepared by: [facility] [address] This aquatic home exercise program from Med[employer] utilizes pictures from land based exercises, but has been  adapted prior to lamination and issuance.   ASSESSMENT:  CLINICAL IMPRESSION: Continued and progressed resisted postural strengthening with mixed tolerance. Requires some cueing to slow the speed of exercise.  FOTO score decreased; likely pt more aware of deficits than at beginning of therapy as pt states she has had 60% improvement overall.  Lumbar ROM improved from intake.  Patient will continue to benefit from physical therapy in order to improve function and reduce impairment. Plan to issue updated aquatic HEP at next pool visit.   Patient has met 2/2 short term goals and 1/5 long term goals with ability to complete HEP and improvement in symptoms and functional mobility. Remaining goals not met due to continued deficits in symptoms strength, ROM, activity tolerance, and functional mobility although she has made good progress toward remaining goals. Patient will continue to benefit from skilled physical therapy in order to improve function and reduce impairment.  9:26 AM, 09/19/23 Wyman Songster PT, DPT Physical Therapist at [facility]   From initial evaluation:  Patient a 88 y.o. y.o. female who was seen today for physical therapy evaluation and treatment for low back pain. Patient presents with pain limited deficits in lumbar spine strength, ROM, endurance, activity tolerance, and functional mobility with ADL. Patient is having to modify and restrict ADL as indicated by outcome measure score as well as subjective information and objective measures which is affecting overall participation. Patient will benefit from skilled physical therapy in order to improve function and reduce impairment.  OBJECTIVE IMPAIRMENTS: decreased activity tolerance, decreased balance, decreased mobility, difficulty walking, decreased ROM, decreased strength, improper body mechanics,  and pain.   ACTIVITY LIMITATIONS: carrying, lifting, bending, sitting, standing, squatting, locomotion level, and caring for  others  PARTICIPATION LIMITATIONS: meal prep, cleaning, laundry, shopping, and community activity  PERSONAL FACTORS: Age, Time since onset of injury/illness/exacerbation, and 3+ comorbidities: chronic back pain, Hx cancer, osteopenia, HTN  are also affecting patient's functional outcome.   REHAB POTENTIAL: Good  CLINICAL DECISION MAKING: Stable/uncomplicated  EVALUATION COMPLEXITY: Low   GOALS: Goals reviewed with patient? Yes  SHORT TERM GOALS: Target date: 08/29/2023    Patient will be independent with HEP in order to improve functional outcomes. Baseline:  Goal status: MET  2.  Patient will report at least 25% improvement in symptoms for improved quality of life. Baseline:  Goal status:MET - 08/25/23    LONG TERM GOALS: Target date: 09/26/2023    Patient will report at least 75% improvement in symptoms for improved quality of life. Baseline: 60% - 09/18/23 Goal status:IN PROGRESS   2.  Patient will improve FOTO score to predicted outcomes in order to indicate improved tolerance to activity. Baseline: 65% function, see above Goal status:IN progress - 09/18/23  3.  Patient will demonstrate at least 25% improvement in lumbar ROM in all restricted planes for improved ability to move trunk while completing chores. Baseline:  Goal status: In progress - 09/18/23  4.  Patient will be able to complete 5x STS in under 11.4 seconds without compensation in order to reduce the risk of falls. Baseline: 12.32 seconds without UE, support, dynamic knee valgus bilaterally, decreased eccentric control. Goal status: MET - 08/25/23  5.  Patient will demonstrate grade of 4+/5 MMT grade in all tested musculature as evidence of improved strength to assist with stair ambulation and gait.   Baseline: see above Goal status: INITIAL     PLAN:  PT FREQUENCY: 1-2x/week  PT DURATION: 8 weeks  PLANNED INTERVENTIONS: [id number]- PT Re-evaluation, 97110-Therapeutic exercises, 97530- Therapeutic  activity, 97112- Neuromuscular re-education, 97535- Self Care, 14782- Manual therapy, [phone]- Gait training, [phone]- Orthotic Fit/training, [phone]- Canalith repositioning, [id number]- Aquatic Therapy, [phone]- Splinting, Patient/Family education, Balance training, Stair training, Taping, Dry Needling, Joint mobilization, Joint manipulation, Spinal manipulation, Spinal mobilization, Scar mobilization, and DME instructions.  PLAN FOR NEXT SESSION: f/u with HEP, core and glute strength, postural strength, lumbar, hip and hamstring mobility  Mayer Camel, PTA 09/18/23 12:51 PM [facility] [address] [place], [place], [address] Phone: [phone]   Fax:  [phone]

## 2023-09-20 ENCOUNTER — Other Ambulatory Visit (HOSPITAL_BASED_OUTPATIENT_CLINIC_OR_DEPARTMENT_OTHER): Payer: Self-pay

## 2023-09-20 ENCOUNTER — Ambulatory Visit (HOSPITAL_BASED_OUTPATIENT_CLINIC_OR_DEPARTMENT_OTHER): Payer: Medicare Other | Admitting: Physical Therapy

## 2023-09-20 ENCOUNTER — Encounter (HOSPITAL_BASED_OUTPATIENT_CLINIC_OR_DEPARTMENT_OTHER): Payer: Self-pay | Admitting: Physical Therapy

## 2023-09-20 DIAGNOSIS — R2689 Other abnormalities of gait and mobility: Secondary | ICD-10-CM

## 2023-09-20 DIAGNOSIS — R29898 Other symptoms and signs involving the musculoskeletal system: Secondary | ICD-10-CM | POA: Diagnosis not present

## 2023-09-20 DIAGNOSIS — M6281 Muscle weakness (generalized): Secondary | ICD-10-CM

## 2023-09-20 DIAGNOSIS — Z23 Encounter for immunization: Secondary | ICD-10-CM | POA: Diagnosis not present

## 2023-09-20 DIAGNOSIS — M5459 Other low back pain: Secondary | ICD-10-CM

## 2023-09-20 MED ORDER — COVID-19 MRNA VAC-TRIS(PFIZER) 30 MCG/0.3ML IM SUSY
0.3000 mL | PREFILLED_SYRINGE | Freq: Once | INTRAMUSCULAR | 0 refills | Status: AC
  Filled 2023-09-20: qty 0.3, 1d supply, fill #0

## 2023-09-20 NOTE — Therapy (Signed)
 OUTPATIENT PHYSICAL THERAPY THORACOLUMBAR TREATMENT   Patient Name: Christina Henderson MRN: [id number] DOB:1935/08/31, 88 y.o., female Today's Date: 09/20/2023   END OF SESSION:  PT End of Session - 09/20/23 0931     Visit Number 11    Number of Visits 16    Date for PT Re-Evaluation 09/26/23    Authorization Type Medicare    Progress Note Due on Visit 20    PT Start Time 0931    PT Stop Time 1017    PT Time Calculation (min) 46 min    Behavior During Therapy [facility] for tasks assessed/performed              Past Medical History:  Diagnosis Date   Arthritis    rhuematoid arthritis   Breast cancer (HCC) 2018   Right Breast Cancer   Cancer (HCC) 03/2017   right breast cancer   Complication of anesthesia    hard to wake up   GERD (gastroesophageal reflux disease)    food related   Glaucoma    Hypertension    Osteoporosis    osteopenia   Rosacea    Past Surgical History:  Procedure Laterality Date   APPENDECTOMY     BACK SURGERY     BREAST BIOPSY     BREAST LUMPECTOMY Right 04/2017   BREAST LUMPECTOMY WITH RADIOACTIVE SEED AND SENTINEL LYMPH NODE BIOPSY Bilateral 05/09/2017   Procedure: BILATERAL RADIOACTIVE SEED GUIDED LUMPECTOMIES WITH RIGHT SENTINEL LYMPH NODE BIOPSY;  Surgeon: Harriette Bouillon, MD;  Location: [facility];  Service: General;  Laterality: Bilateral;   EYE SURGERY     cataract   SPINE SURGERY     lumbar lamenectomy   TONSILLECTOMY     Patient Active Problem List   Diagnosis Date Noted   SOB (shortness of breath) 02/15/2023   Bradycardia 08/15/2022   Dyslipidemia 11/25/2019   Educated about COVID-19 virus infection 11/25/2019   Chest pain 07/17/2018   Cough 07/17/2018   Essential hypertension 07/17/2018   Malignant neoplasm of upper-outer quadrant of right breast in female, estrogen receptor positive (HCC) 05/23/2017    PCP: Gweneth Dimitri MD  REFERRING PROVIDER: Hurman Horn, MD  REFERRING DIAG: M54.50 (ICD-10-CM)  - Low back pain, unspecified; bilateral hip pain  Rationale for Evaluation and Treatment: Rehabilitation  THERAPY DIAG:  Other low back pain  Muscle weakness (generalized)  Other abnormalities of gait and mobility  Other symptoms and signs involving the musculoskeletal system  ONSET DATE: about 6 weeks  SUBJECTIVE:  SUBJECTIVE STATEMENT: Pt reports was aching after pool session on Friday. The aching has lessened. Has been walking. Soreness improves with mobility. Has a gym at her place but has not used, uses the pool and walks for exercise.   From initial evaluation:  Patient states she does swimming and MD wanted her to build up bones up last year with PT. Now back has been painful. Pain began at least 6 weeks ago with insidious onset. Notes pain with walking, transfers, sitting. Patient stating pain in low back, bilateral hips, and hamstrings. Symptoms improve with more walking. Sitting for a long time 30-50 minutes and then when she gets up it hurts and so does after getting up in the morning.   PERTINENT HISTORY:  Hx cancer, osteopenia, HTN, 1974 back surgery   PAIN:  Are you having pain? yes Location: low back to prox post thighs NPRS: 2/10    PRECAUTIONS: None  WEIGHT BEARING RESTRICTIONS: No  FALLS:  Has patient fallen in last 6 months? No   PLOF: Independent  PATIENT GOALS: eliminate the pain   OBJECTIVE: (objective measures from initial evaluation unless otherwise dated)   PATIENT SURVEYS:  FOTO 65% function 09/18/23:  60%   SCREENING FOR RED FLAGS: Bowel or bladder incontinence: No Spinal tumors: No Cauda equina syndrome: No Compression fracture: No Abdominal aneurysm: No  COGNITION: Overall cognitive status: Within functional limits for tasks  assessed     SENSATION: WFL    PALPATION: No tenderness in lumbar spine. Tender at L greater Trochanter and R lateral hamstrings  LUMBAR ROM:   AROM eval 09/18/23  Flexion 25% limited (posterior LE "tight") Finger tips to ankles   Extension 25% limited  10% limited  Right lateral flexion 25% limited  WNL  Left lateral flexion 25% limited  10% limited  Right rotation    Left rotation     (Blank rows = not tested) * = pain/symptoms  LOWER EXTREMITY ROM:   decreased hip extension bilaterally  Active  Right eval Left eval  Hip flexion    Hip extension    Hip abduction    Hip adduction    Hip internal rotation    Hip external rotation    Knee flexion    Knee extension    Ankle dorsiflexion    Ankle plantarflexion    Ankle inversion    Ankle eversion     (Blank rows = not tested) * = pain/symptoms  LOWER EXTREMITY MMT:    MMT Right eval Left eval  Hip flexion 4+ 4+  Hip extension 3+ (ROM only to neutral) 3+ (ROM only to neutral)  Hip abduction 4- 4-  Hip adduction    Hip internal rotation    Hip external rotation    Knee flexion 5 5  Knee extension 5 5  Ankle dorsiflexion 5 5  Ankle plantarflexion    Ankle inversion    Ankle eversion     (Blank rows = not tested) * = pain/symptoms    FUNCTIONAL TESTS:  5 times sit to stand: 12.32 seconds without UE, support, dynamic knee valgus bilaterally, decreased eccentric control.   08/25/23: 5x STS - 11.07s  GAIT: Distance walked: 100 feet Assistive device utilized: None Level of assistance: Complete Independence Comments: WFL  TODAY'S TREATMENT:  DATE:  09/20/23 Leg extension machine 10# 2 x 10 Hamstring curl machine 10# 3 x 10  Palof press RTB 2 x 10  Lift/chop RTB 2 x 10 each   09/18/23 Pt seen for aquatic therapy today.  Treatment took place in water 3.5-4.75 ft in depth at the  [facility] pool. Temp of water was 91.  Pt entered/exited the pool via stairs independently with bilat rail  - unsupported walking forward/ backward with reciprocal arm swing, multiple laps -Side stepping with arm addct/ abdct with yellow hand floats - UE on wall: hip ext x 10; hip abdct/ addct x 10 cues for vertical trunk - squats pulling single yellow hand float under water x 10, holding 3-4 sec under water - staggered stance with kick board row x 10 each; repeated with vectors (11 and 1o'clock) x 15 each  - TrA set with short -> long hollow noodle pull down to thighs 2 x 10  - warrior 3 - with yellow hand floats x 10 each - cues for technique and to slow down - warrior 1 - noodle lifts off of water x 3 each side ('feels it in back")  - cycling on yellow noodle    09/13/23 DKTC with heels on swiss ball 10 x 5 second holds Supine dead bug isometric 10 x 5 second holds Ab set 5 x 5 second holds March with ab set 2 x 10 Standing hip abduction RTB at knees 2 x 10 Standing hip extension RTB at knees 2 x 10 Standing Row RTB 2 x 15 Standing shoulder extension RTB 2 x 15 Seated trunk flexion stretch with ball 10 x 5-10 second holds SLS 2 x 20 second holds  09/11/23 Pt seen for aquatic therapy today.  Treatment took place in water 3.5-4.75 ft in depth at the [facility] pool. Temp of water was 91.  Pt entered/exited the pool via stairs independently with bilat rail. - unsupported walking forward/ backward  -Side stepping with arm addct/ abdct with rainbow hand floats - UE on wall: hip ext x 10; hip abdct/ addct x 10 - staggered stance with kick board row x 10 each; repeated with vectors (11 and 1o'clock) x 10 each  - SLS with opp arm motions with hollow noodle under water  - balance on hollow noodle (like skate board) with intermittent UE to steady -> marching (challenge!) - squats pulling single yellow hand float under water x 15 - TrA set with hollow noodle pull down to  thighs x 20 (cues for technique and to slow down)  - 3 way LE stretch with hollow blue noodle (ITB, hamstrings, adductor stretches) at ankle x 15s each position - warrior 3 - with yellow hand floats x 8 each - cues for technique and to slow down   09/04/23 Pt seen for aquatic therapy today.  Treatment took place in water 3.5-4.75 ft in depth at the [facility] pool. Temp of water was 91.  Pt entered/exited the pool via stairs independently with bilat rail. - unsupported walking forward/ backward with cues for vertical trunk -Side stepping with arm addct/ abdct with rainbow hand floats - plank with yellow hand floats ->trial with leg lifts (challenge) and superman to/from plank (difficult) - TrA set with hollow noodle pull down to thighs, x 10 wide stance, x 5 staggered stance, x 5 in wide stance with 4 sec release to surface  - UE on wall:  single leg clams x 10 - - 3 way LE stretch  with hollow blue noodle (ITB, hamstrings, adductor stretches) at ankle x 15s each position - squats pulling single yellow hand float under water x 10 (cues to allow heels to come up) - farmer carry with single yellow hand floats, walking forward/ backward  - straddling noodle and UE on corner: cycling  08/29/23 Pt seen for aquatic therapy today.  Treatment took place in water 3.5-4.75 ft in depth at the [facility] pool. Temp of water was 91.  Pt entered/exited the pool via stairs independently with bilat rail. - unsupported walking forward/ backward with cues for vertical trunk - wide stance - reciprocal arm swing with light resistance bells, horiz abdct/addct (under water) with bells -> walking forward/ backward with recipocal arm swing with green bells -Side stepping with arm addct/ abdct with rainbow hand floats - farmer carry with bilat rainbow hand floats, walking forward/ backward -> single rainbow - UE on rainbow hand floats- single hand against wall:  hip abdct/ addct, crossing midline x  15 each;  leg swings into hip flexion/ext x 15 each engage core  - UE on rainbow hand floats:  tandem gait forward/ backward x 2 laps, cues to slow speed - squats pulling single yellow hand float under water 2 x 10 - TrA set with solid -> hollow noodle pull down to thighs, 4 sec release to surface x 15  - straddling noodle and UE on corner: cycling, hip abdct/ addct and cross country ski - 3 way LE stretch with hollow blue noodle (ITB, hamstrings, adductor stretches) at ankle x 15s each position   [facility] Adult PT Treatment:                                                DATE: 08/25/23  Therapeutic Exercise: NuStep L4: UE/LE x 5 min for warm up UE on counter:  hip ext x 10 each;  Hip abdct x 10 each;  heel toe raises x 10  Seated hamstring stretch with DF x 15s x 2 each LE  5x STS ( see above)  Seated pirirformis stretch 2 versions x 15s each Walking 300 ft  Self Care: Reviewed body mechanics for changing cat's water bowl (bend knees/hips and not just from back); pt returned demo  Educated on self massage with stick roller to buttocks and LEs; pt returned demo  DATE:  08/21/23 Pt seen for aquatic therapy today.  Treatment took place in water 3.5-4.75 ft in depth at the [facility] pool. Temp of water was 91.  Pt entered/exited the pool via stairs independently with bilat rail. - unsupported walking forward/ backward with cues for vertical trunk -Side stepping with arm addct/ abdct with rainbow hand floats - farmer carry with bilat rainbow hand floats, walking forward/ backward -> single yellow  - marching with reciprocal arm motion with rainbow hand floats - UE on yellow hand floats hip abdct/ addct x 10 each;  leg swings into hip flexion/ext x 10 each - cues to slow speed and engage core x 10 each LE - UE on wall: hip abdct/ addct x 10 each;  leg swings into hip flexion/ext x 10 each; single leg clams x 10 each  - TrA set with single rainbow hand floats pull downs to thighs x  10 - squats pulling single yellow hand float under water x 10 - straddling noodle and UE on  corner: cycling, hip abdct/ addct and cross country ski - 3 way LE stretch with hollow blue noodle (ITB, hamstrings, adductor stretches) at ankle x 15s each position   08/18/23 Pt seen for aquatic therapy today.  Treatment took place in water 3.5-4.75 ft in depth at the [facility] pool. Temp of water was 91.  Pt entered/exited the pool via stairs independently with bilat rail. - unsupported walking forward/ backward with cues for vertical trunk - farmer carry with bilat rainbow hand floats, walking forward/ backward -> single yellow  - marching with reciprocal arm motion with rainbow hand floats - UE on rainbow hand floats 3 way LE kicks - cues to slow speed and engage core x 10 each LE - UE on yellow hand floats, 3 way toe taps x 5 each LE; heel raises x 12 - TrA set with short hollow noodle pull downs to thighs x 8 -> single rainbow hand floats x 10 - UE on wall: hip abdct/ addct x 10 each;  leg swings into hip flexion/ext x 10 each - straddling noodle and UE on corner: cycling, hip abdct/ addct and cross country ski   08/16/23 NuStep L4: UE/LE x 5 min for warm up Seated hamstring stretch x15s x 2 each LE Seated piriformis stretch x 15s x 2 each LE Bridge - painful - stopped after 2 Standing resisted shoulder ext with core engaged, red band x 15;  resisted bilat row with red band x 15 STS with cues for forward arm reach, hip hinge, core engaged 2 x 5 Trunk ext (limited tolerance past neutral)    PATIENT EDUCATION:  Education details: exercise rationale, modifications, progressions Person educated: Patient Education method: Programmer, multimedia, Demonstration,  Education comprehension: verbalized understanding, returned demonstration, verbal cues required, and tactile cues required  HOME EXERCISE PROGRAM: Land Access Code: [email or web address] URL: [email or web address]    AQUATIC  Access Code: [email or web address] URL: [email or web address] Date: 08/21/2023   Prepared by: [facility] This aquatic home exercise program from [employer] utilizes pictures from land based exercises, but has been adapted prior to lamination and issuance.   ASSESSMENT:  CLINICAL IMPRESSION: Began gym machines  and discussed patient checking if they have at her gym so she can possibly complete on her own. Continued with resisted postural strengthening. BP meds kick in during session requiring short break due to "wobbly" symptoms. Patient will continue to benefit from physical therapy in order to improve function and reduce impairment.    From initial evaluation:  Patient a 88 y.o. y.o. female who was seen today for physical therapy evaluation and treatment for low back pain. Patient presents with pain limited deficits in lumbar spine strength, ROM, endurance, activity tolerance, and functional mobility with ADL. Patient is having to modify and restrict ADL as indicated by outcome measure score as well as subjective information and objective measures which is affecting overall participation. Patient will benefit from skilled physical therapy in order to improve function and reduce impairment.  OBJECTIVE IMPAIRMENTS: decreased activity tolerance, decreased balance, decreased mobility, difficulty walking, decreased ROM, decreased strength, improper body mechanics, and pain.   ACTIVITY LIMITATIONS: carrying, lifting, bending, sitting, standing, squatting, locomotion level, and caring for others  PARTICIPATION LIMITATIONS: meal prep, cleaning, laundry, shopping, and community activity  PERSONAL FACTORS: Age, Time since onset of injury/illness/exacerbation, and 3+ comorbidities: chronic back pain, Hx cancer, osteopenia, HTN  are also affecting patient's functional outcome.   REHAB POTENTIAL: Good  CLINICAL DECISION MAKING: Stable/uncomplicated  EVALUATION  COMPLEXITY: Low  GOALS: Goals reviewed with patient? Yes  SHORT TERM GOALS: Target date: 08/29/2023    Patient will be independent with HEP in order to improve functional outcomes. Baseline:  Goal status: MET  2.  Patient will report at least 25% improvement in symptoms for improved quality of life. Baseline:  Goal status:MET - 08/25/23    LONG TERM GOALS: Target date: 09/26/2023    Patient will report at least 75% improvement in symptoms for improved quality of life. Baseline: 60% - 09/18/23 Goal status:IN PROGRESS   2.  Patient will improve FOTO score to predicted outcomes in order to indicate improved tolerance to activity. Baseline: 65% function, see above Goal status:IN progress - 09/18/23  3.  Patient will demonstrate at least 25% improvement in lumbar ROM in all restricted planes for improved ability to move trunk while completing chores. Baseline:  Goal status: In progress - 09/18/23  4.  Patient will be able to complete 5x STS in under 11.4 seconds without compensation in order to reduce the risk of falls. Baseline: 12.32 seconds without UE, support, dynamic knee valgus bilaterally, decreased eccentric control. Goal status: MET - 08/25/23  5.  Patient will demonstrate grade of 4+/5 MMT grade in all tested musculature as evidence of improved strength to assist with stair ambulation and gait.   Baseline: see above Goal status: INITIAL     PLAN:  PT FREQUENCY: 1-2x/week  PT DURATION: 8 weeks  PLANNED INTERVENTIONS: 97164- PT Re-evaluation, 97110-Therapeutic exercises, 97530- Therapeutic activity, 97112- Neuromuscular re-education, 97535- Self Care, [id number]- Manual therapy, [phone]- Gait training, [phone]- Orthotic Fit/training, [phone]- Canalith repositioning, [id number]- Aquatic Therapy, [phone]- Splinting, Patient/Family education, Balance training, Stair training, Taping, Dry Needling, Joint mobilization, Joint manipulation, Spinal manipulation, Spinal mobilization, Scar  mobilization, and DME instructions.  PLAN FOR NEXT SESSION: f/u with HEP, core and glute strength, postural strength, lumbar, hip and hamstring mobility   Wyman Songster, PT 09/20/2023, 10:17 AM  [facility] [address] [place], [place], [address] Phone: [phone]   Fax:  [phone]

## 2023-09-25 ENCOUNTER — Encounter (HOSPITAL_BASED_OUTPATIENT_CLINIC_OR_DEPARTMENT_OTHER): Payer: Self-pay | Admitting: Physical Therapy

## 2023-09-25 ENCOUNTER — Ambulatory Visit (HOSPITAL_BASED_OUTPATIENT_CLINIC_OR_DEPARTMENT_OTHER): Payer: Medicare Other | Admitting: Physical Therapy

## 2023-09-25 DIAGNOSIS — R29898 Other symptoms and signs involving the musculoskeletal system: Secondary | ICD-10-CM | POA: Diagnosis not present

## 2023-09-25 DIAGNOSIS — R2689 Other abnormalities of gait and mobility: Secondary | ICD-10-CM | POA: Diagnosis not present

## 2023-09-25 DIAGNOSIS — M5459 Other low back pain: Secondary | ICD-10-CM

## 2023-09-25 DIAGNOSIS — M6281 Muscle weakness (generalized): Secondary | ICD-10-CM

## 2023-09-25 NOTE — Therapy (Signed)
 OUTPATIENT PHYSICAL THERAPY THORACOLUMBAR TREATMENT   Patient Name: Christina Henderson MRN: [id number] DOB:02/15/1936, 88 y.o., female Today's Date: 09/25/2023   END OF SESSION:  PT End of Session - 09/25/23 0937     Visit Number 12    Number of Visits 16    Date for PT Re-Evaluation 09/26/23    Authorization Type Medicare    Progress Note Due on Visit 20    PT Start Time 0928    PT Stop Time 1007    PT Time Calculation (min) 39 min    Activity Tolerance Patient tolerated treatment well    Behavior During Therapy [facility] for tasks assessed/performed              Past Medical History:  Diagnosis Date   Arthritis    rhuematoid arthritis   Breast cancer (HCC) 2018   Right Breast Cancer   Cancer (HCC) 03/2017   right breast cancer   Complication of anesthesia    hard to wake up   GERD (gastroesophageal reflux disease)    food related   Glaucoma    Hypertension    Osteoporosis    osteopenia   Rosacea    Past Surgical History:  Procedure Laterality Date   APPENDECTOMY     BACK SURGERY     BREAST BIOPSY     BREAST LUMPECTOMY Right 04/2017   BREAST LUMPECTOMY WITH RADIOACTIVE SEED AND SENTINEL LYMPH NODE BIOPSY Bilateral 05/09/2017   Procedure: BILATERAL RADIOACTIVE SEED GUIDED LUMPECTOMIES WITH RIGHT SENTINEL LYMPH NODE BIOPSY;  Surgeon: Harriette Bouillon, MD;  Location: [facility];  Service: General;  Laterality: Bilateral;   EYE SURGERY     cataract   SPINE SURGERY     lumbar lamenectomy   TONSILLECTOMY     Patient Active Problem List   Diagnosis Date Noted   SOB (shortness of breath) 02/15/2023   Bradycardia 08/15/2022   Dyslipidemia 11/25/2019   Educated about COVID-19 virus infection 11/25/2019   Chest pain 07/17/2018   Cough 07/17/2018   Essential hypertension 07/17/2018   Malignant neoplasm of upper-outer quadrant of right breast in female, estrogen receptor positive (HCC) 05/23/2017    PCP: Gweneth Dimitri MD  REFERRING PROVIDER:  Hurman Horn, MD  REFERRING DIAG: M54.50 (ICD-10-CM) - Low back pain, unspecified; bilateral hip pain  Rationale for Evaluation and Treatment: Rehabilitation  THERAPY DIAG:  Other low back pain  Muscle weakness (generalized)  Other abnormalities of gait and mobility  Other symptoms and signs involving the musculoskeletal system  ONSET DATE: about 6 weeks  SUBJECTIVE:  SUBJECTIVE STATEMENT: " Friday and Saturday I had NO pain. And the only reason I have a twinge today is because I sat and knitted all day yesterday".    From initial evaluation:  Patient states she does swimming and MD wanted her to build up bones up last year with PT. Now back has been painful. Pain began at least 6 weeks ago with insidious onset. Notes pain with walking, transfers, sitting. Patient stating pain in low back, bilateral hips, and hamstrings. Symptoms improve with more walking. Sitting for a long time 30-50 minutes and then when she gets up it hurts and so does after getting up in the morning.   PERTINENT HISTORY:  Hx cancer, osteopenia, HTN, 1974 back surgery   PAIN:  Are you having pain? yes Location: low back to prox post thighs NPRS: 1.5/10    PRECAUTIONS: None  WEIGHT BEARING RESTRICTIONS: No  FALLS:  Has patient fallen in last 6 months? No   PLOF: Independent  PATIENT GOALS: eliminate the pain   OBJECTIVE: (objective measures from initial evaluation unless otherwise dated)   PATIENT SURVEYS:  FOTO 65% function 09/18/23:  60%   SCREENING FOR RED FLAGS: Bowel or bladder incontinence: No Spinal tumors: No Cauda equina syndrome: No Compression fracture: No Abdominal aneurysm: No  COGNITION: Overall cognitive status: Within functional limits for tasks  assessed     SENSATION: WFL    PALPATION: No tenderness in lumbar spine. Tender at L greater Trochanter and R lateral hamstrings  LUMBAR ROM:   AROM eval 09/18/23  Flexion 25% limited (posterior LE "tight") Finger tips to ankles   Extension 25% limited  10% limited  Right lateral flexion 25% limited  WNL  Left lateral flexion 25% limited  10% limited  Right rotation    Left rotation     (Blank rows = not tested) * = pain/symptoms  LOWER EXTREMITY ROM:   decreased hip extension bilaterally  Active  Right eval Left eval  Hip flexion    Hip extension    Hip abduction    Hip adduction    Hip internal rotation    Hip external rotation    Knee flexion    Knee extension    Ankle dorsiflexion    Ankle plantarflexion    Ankle inversion    Ankle eversion     (Blank rows = not tested) * = pain/symptoms  LOWER EXTREMITY MMT:    MMT Right eval Left eval  Hip flexion 4+ 4+  Hip extension 3+ (ROM only to neutral) 3+ (ROM only to neutral)  Hip abduction 4- 4-  Hip adduction    Hip internal rotation    Hip external rotation    Knee flexion 5 5  Knee extension 5 5  Ankle dorsiflexion 5 5  Ankle plantarflexion    Ankle inversion    Ankle eversion     (Blank rows = not tested) * = pain/symptoms    FUNCTIONAL TESTS:  5 times sit to stand: 12.32 seconds without UE, support, dynamic knee valgus bilaterally, decreased eccentric control.   08/25/23: 5x STS - 11.07s  GAIT: Distance walked: 100 feet Assistive device utilized: None Level of assistance: Complete Independence Comments: WFL  TODAY'S TREATMENT:  DATE:  09/25/23 Pt seen for aquatic therapy today.  Treatment took place in water 3.5-4.75 ft in depth at the [facility] pool. Temp of water was 91.  Pt entered/exited the pool via stairs independently with bilat rail  - unsupported  walking forward/ backward with reciprocal arm swing, multiple laps -Side stepping with arm addct/ abdct with yellow hand floats - farmer carry with single yellow -> rainbow hand float, walking forward/ backward - UE on hand floats:  hip flex/ext x 15; hip abdct/ addct x 15 cues for vertical trunk - warrior 1 - noodle lifts off of water 2 x 3 each side, cues to slow down - holding wall:  single leg clams x 10 - staggered stance with kick board row x 10 each; repeated with vectors (11 and 1o'clock) x 15 each  - TrA set with short -> long hollow noodle pull down to thighs 2 x 10  - squats pulling single yellow hand float under water 2x 10 - cycling on yellow noodle   09/20/23 Leg extension machine 10# 2 x 10 Hamstring curl machine 10# 3 x 10  Palof press RTB 2 x 10  Lift/chop RTB 2 x 10 each   09/18/23 Pt seen for aquatic therapy today.  Treatment took place in water 3.5-4.75 ft in depth at the [facility] pool. Temp of water was 91.  Pt entered/exited the pool via stairs independently with bilat rail  - unsupported walking forward/ backward with reciprocal arm swing, multiple laps -Side stepping with arm addct/ abdct with yellow hand floats - UE on wall: hip ext x 10; hip abdct/ addct x 10 cues for vertical trunk - squats pulling single yellow hand float under water x 10, holding 3-4 sec under water - staggered stance with kick board row x 10 each; repeated with vectors (11 and 1o'clock) x 15 each  - TrA set with short -> long hollow noodle pull down to thighs 2 x 10  - warrior 3 - with yellow hand floats x 10 each - cues for technique and to slow down - warrior 1 - noodle lifts off of water x 3 each side ('feels it in back")  - cycling on yellow noodle    09/13/23 DKTC with heels on swiss ball 10 x 5 second holds Supine dead bug isometric 10 x 5 second holds Ab set 5 x 5 second holds March with ab set 2 x 10 Standing hip abduction RTB at knees 2 x 10 Standing hip extension  RTB at knees 2 x 10 Standing Row RTB 2 x 15 Standing shoulder extension RTB 2 x 15 Seated trunk flexion stretch with ball 10 x 5-10 second holds SLS 2 x 20 second holds  09/11/23 Pt seen for aquatic therapy today.  Treatment took place in water 3.5-4.75 ft in depth at the [facility] pool. Temp of water was 91.  Pt entered/exited the pool via stairs independently with bilat rail. - unsupported walking forward/ backward  -Side stepping with arm addct/ abdct with rainbow hand floats - UE on wall: hip ext x 10; hip abdct/ addct x 10 - staggered stance with kick board row x 10 each; repeated with vectors (11 and 1o'clock) x 10 each  - SLS with opp arm motions with hollow noodle under water  - balance on hollow noodle (like skate board) with intermittent UE to steady -> marching (challenge!) - squats pulling single yellow hand float under water x 15 - TrA set with hollow noodle  pull down to thighs x 20 (cues for technique and to slow down)  - 3 way LE stretch with hollow blue noodle (ITB, hamstrings, adductor stretches) at ankle x 15s each position - warrior 3 - with yellow hand floats x 8 each - cues for technique and to slow down   09/04/23 Pt seen for aquatic therapy today.  Treatment took place in water 3.5-4.75 ft in depth at the [facility] pool. Temp of water was 91.  Pt entered/exited the pool via stairs independently with bilat rail. - unsupported walking forward/ backward with cues for vertical trunk -Side stepping with arm addct/ abdct with rainbow hand floats - plank with yellow hand floats ->trial with leg lifts (challenge) and superman to/from plank (difficult) - TrA set with hollow noodle pull down to thighs, x 10 wide stance, x 5 staggered stance, x 5 in wide stance with 4 sec release to surface  - UE on wall:  single leg clams x 10 - - 3 way LE stretch with hollow blue noodle (ITB, hamstrings, adductor stretches) at ankle x 15s each position - squats pulling  single yellow hand float under water x 10 (cues to allow heels to come up) - farmer carry with single yellow hand floats, walking forward/ backward  - straddling noodle and UE on corner: cycling  08/29/23 Pt seen for aquatic therapy today.  Treatment took place in water 3.5-4.75 ft in depth at the [facility] pool. Temp of water was 91.  Pt entered/exited the pool via stairs independently with bilat rail. - unsupported walking forward/ backward with cues for vertical trunk - wide stance - reciprocal arm swing with light resistance bells, horiz abdct/addct (under water) with bells -> walking forward/ backward with recipocal arm swing with green bells -Side stepping with arm addct/ abdct with rainbow hand floats - farmer carry with bilat rainbow hand floats, walking forward/ backward -> single rainbow - UE on rainbow hand floats- single hand against wall:  hip abdct/ addct, crossing midline x 15 each;  leg swings into hip flexion/ext x 15 each engage core  - UE on rainbow hand floats:  tandem gait forward/ backward x 2 laps, cues to slow speed - squats pulling single yellow hand float under water 2 x 10 - TrA set with solid -> hollow noodle pull down to thighs, 4 sec release to surface x 15  - straddling noodle and UE on corner: cycling, hip abdct/ addct and cross country ski - 3 way LE stretch with hollow blue noodle (ITB, hamstrings, adductor stretches) at ankle x 15s each position   [facility] Adult PT Treatment:                                                DATE: 08/25/23  Therapeutic Exercise: NuStep L4: UE/LE x 5 min for warm up UE on counter:  hip ext x 10 each;  Hip abdct x 10 each;  heel toe raises x 10  Seated hamstring stretch with DF x 15s x 2 each LE  5x STS ( see above)  Seated pirirformis stretch 2 versions x 15s each Walking 300 ft  Self Care: Reviewed body mechanics for changing cat's water bowl (bend knees/hips and not just from back); pt returned demo  Educated on  self massage with stick roller to buttocks and LEs; pt returned demo  DATE:  08/21/23 Pt seen for aquatic therapy today.  Treatment took place in water 3.5-4.75 ft in depth at the [facility] pool. Temp of water was 91.  Pt entered/exited the pool via stairs independently with bilat rail. - unsupported walking forward/ backward with cues for vertical trunk -Side stepping with arm addct/ abdct with rainbow hand floats - farmer carry with bilat rainbow hand floats, walking forward/ backward -> single yellow  - marching with reciprocal arm motion with rainbow hand floats - UE on yellow hand floats hip abdct/ addct x 10 each;  leg swings into hip flexion/ext x 10 each - cues to slow speed and engage core x 10 each LE - UE on wall: hip abdct/ addct x 10 each;  leg swings into hip flexion/ext x 10 each; single leg clams x 10 each  - TrA set with single rainbow hand floats pull downs to thighs x 10 - squats pulling single yellow hand float under water x 10 - straddling noodle and UE on corner: cycling, hip abdct/ addct and cross country ski - 3 way LE stretch with hollow blue noodle (ITB, hamstrings, adductor stretches) at ankle x 15s each position   08/18/23 Pt seen for aquatic therapy today.  Treatment took place in water 3.5-4.75 ft in depth at the [facility] pool. Temp of water was 91.  Pt entered/exited the pool via stairs independently with bilat rail. - unsupported walking forward/ backward with cues for vertical trunk - farmer carry with bilat rainbow hand floats, walking forward/ backward -> single yellow  - marching with reciprocal arm motion with rainbow hand floats - UE on rainbow hand floats 3 way LE kicks - cues to slow speed and engage core x 10 each LE - UE on yellow hand floats, 3 way toe taps x 5 each LE; heel raises x 12 - TrA set with short hollow noodle pull downs to thighs x 8 -> single rainbow hand floats x 10 - UE on wall: hip abdct/ addct x 10 each;  leg  swings into hip flexion/ext x 10 each - straddling noodle and UE on corner: cycling, hip abdct/ addct and cross country ski   08/16/23 NuStep L4: UE/LE x 5 min for warm up Seated hamstring stretch x15s x 2 each LE Seated piriformis stretch x 15s x 2 each LE Bridge - painful - stopped after 2 Standing resisted shoulder ext with core engaged, red band x 15;  resisted bilat row with red band x 15 STS with cues for forward arm reach, hip hinge, core engaged 2 x 5 Trunk ext (limited tolerance past neutral)    PATIENT EDUCATION:  Education details: exercise rationale, modifications, progressions Person educated: Patient Education method: Programmer, multimedia, Demonstration,  Education comprehension: verbalized understanding, returned demonstration, verbal cues required, and tactile cues required  HOME EXERCISE PROGRAM: Land Access Code: [email or web address] URL: [email or web address]    AQUATIC Access Code: [email or web address] URL: [email or web address] This aquatic home exercise program from [employer] utilizes pictures from land based exercises, but has been adapted prior to lamination and issuance.   ASSESSMENT:  CLINICAL IMPRESSION: Pt issued updated laminated aquatic HEP.  Able to tolerate all exercises well, with report of some fatigue in lower back. Pt given frequent cues to slow speed of exercise, and hold trunk in more vertical position.  Pt has reached max potential in aquatic setting; will continue progressing with land exercises. Pt making great progress towards remaining goals.  Patient will continue to benefit from physical therapy in order to improve function  and reduce impairment.    From initial evaluation:  Patient a 88 y.o. y.o. female who was seen today for physical therapy evaluation and treatment for low back pain. Patient presents with pain limited deficits in lumbar spine strength, ROM, endurance, activity tolerance, and functional mobility with ADL. Patient is having  to modify and restrict ADL as indicated by outcome measure score as well as subjective information and objective measures which is affecting overall participation. Patient will benefit from skilled physical therapy in order to improve function and reduce impairment.  OBJECTIVE IMPAIRMENTS: decreased activity tolerance, decreased balance, decreased mobility, difficulty walking, decreased ROM, decreased strength, improper body mechanics, and pain.   ACTIVITY LIMITATIONS: carrying, lifting, bending, sitting, standing, squatting, locomotion level, and caring for others  PARTICIPATION LIMITATIONS: meal prep, cleaning, laundry, shopping, and community activity  PERSONAL FACTORS: Age, Time since onset of injury/illness/exacerbation, and 3+ comorbidities: chronic back pain, Hx cancer, osteopenia, HTN  are also affecting patient's functional outcome.   REHAB POTENTIAL: Good  CLINICAL DECISION MAKING: Stable/uncomplicated  EVALUATION COMPLEXITY: Low   GOALS: Goals reviewed with patient? Yes  SHORT TERM GOALS: Target date: 08/29/2023    Patient will be independent with HEP in order to improve functional outcomes. Baseline:  Goal status: MET  2.  Patient will report at least 25% improvement in symptoms for improved quality of life. Baseline:  Goal status:MET - 08/25/23    LONG TERM GOALS: Target date: 09/26/2023    Patient will report at least 75% improvement in symptoms for improved quality of life. Baseline: 60% - 09/18/23 Goal status:IN PROGRESS   2.  Patient will improve FOTO score to predicted outcomes in order to indicate improved tolerance to activity. Baseline: 65% function, see above Goal status:IN progress - 09/18/23  3.  Patient will demonstrate at least 25% improvement in lumbar ROM in all restricted planes for improved ability to move trunk while completing chores. Baseline:  Goal status: In progress - 09/18/23  4.  Patient will be able to complete 5x STS in under 11.4  seconds without compensation in order to reduce the risk of falls. Baseline: 12.32 seconds without UE, support, dynamic knee valgus bilaterally, decreased eccentric control. Goal status: MET - 08/25/23  5.  Patient will demonstrate grade of 4+/5 MMT grade in all tested musculature as evidence of improved strength to assist with stair ambulation and gait.   Baseline: see above Goal status: INITIAL     PLAN:  PT FREQUENCY: 1-2x/week  PT DURATION: 8 weeks  PLANNED INTERVENTIONS: [id number]- PT Re-evaluation, 97110-Therapeutic exercises, 97530- Therapeutic activity, 97112- Neuromuscular re-education, 97535- Self Care, 95621- Manual therapy, [phone]- Gait training, [phone]- Orthotic Fit/training, [phone]- Canalith repositioning, [id number]- Aquatic Therapy, [phone]- Splinting, Patient/Family education, Balance training, Stair training, Taping, Dry Needling, Joint mobilization, Joint manipulation, Spinal manipulation, Spinal mobilization, Scar mobilization, and DME instructions.  PLAN FOR NEXT SESSION: f/u with HEP, core and glute strength, postural strength, lumbar, hip and hamstring mobility  Mayer Camel, PTA 09/25/23 10:07 AM [facility] [address] [place], [place], [address] Phone: [phone]   Fax:  [phone]

## 2023-09-27 ENCOUNTER — Ambulatory Visit (HOSPITAL_BASED_OUTPATIENT_CLINIC_OR_DEPARTMENT_OTHER): Payer: Medicare Other | Admitting: Physical Therapy

## 2023-09-27 ENCOUNTER — Encounter (HOSPITAL_BASED_OUTPATIENT_CLINIC_OR_DEPARTMENT_OTHER): Payer: Self-pay | Admitting: Physical Therapy

## 2023-09-27 DIAGNOSIS — M5459 Other low back pain: Secondary | ICD-10-CM | POA: Diagnosis not present

## 2023-09-27 DIAGNOSIS — R29898 Other symptoms and signs involving the musculoskeletal system: Secondary | ICD-10-CM | POA: Diagnosis not present

## 2023-09-27 DIAGNOSIS — R2689 Other abnormalities of gait and mobility: Secondary | ICD-10-CM

## 2023-09-27 DIAGNOSIS — M6281 Muscle weakness (generalized): Secondary | ICD-10-CM | POA: Diagnosis not present

## 2023-09-27 NOTE — Therapy (Signed)
 OUTPATIENT PHYSICAL THERAPY THORACOLUMBAR TREATMENT   Patient Name: Christina Henderson MRN: [id number] DOB:February 16, 1936, 88 y.o., female Today's Date: 09/27/2023  PHYSICAL THERAPY DISCHARGE SUMMARY  Visits from Start of Care: 13  Current functional level related to goals / functional outcomes: See below   Remaining deficits: See below   Education / Equipment: See below   Patient agrees to discharge. Patient goals were partially met. Patient is being discharged due to being pleased with the current functional level.    END OF SESSION:  PT End of Session - 09/27/23 0936     Visit Number 13    Number of Visits 16    Date for PT Re-Evaluation 09/26/23    Authorization Type Medicare    Progress Note Due on Visit 20    PT Start Time 0936    PT Stop Time 1015    PT Time Calculation (min) 39 min    Activity Tolerance Patient tolerated treatment well    Behavior During Therapy [facility] for tasks assessed/performed              Past Medical History:  Diagnosis Date   Arthritis    rhuematoid arthritis   Breast cancer (HCC) 2018   Right Breast Cancer   Cancer (HCC) 03/2017   right breast cancer   Complication of anesthesia    hard to wake up   GERD (gastroesophageal reflux disease)    food related   Glaucoma    Hypertension    Osteoporosis    osteopenia   Rosacea    Past Surgical History:  Procedure Laterality Date   APPENDECTOMY     BACK SURGERY     BREAST BIOPSY     BREAST LUMPECTOMY Right 04/2017   BREAST LUMPECTOMY WITH RADIOACTIVE SEED AND SENTINEL LYMPH NODE BIOPSY Bilateral 05/09/2017   Procedure: BILATERAL RADIOACTIVE SEED GUIDED LUMPECTOMIES WITH RIGHT SENTINEL LYMPH NODE BIOPSY;  Surgeon: Harriette Bouillon, MD;  Location: [facility];  Service: General;  Laterality: Bilateral;   EYE SURGERY     cataract   SPINE SURGERY     lumbar lamenectomy   TONSILLECTOMY     Patient Active Problem List   Diagnosis Date Noted   SOB (shortness of  breath) 02/15/2023   Bradycardia 08/15/2022   Dyslipidemia 11/25/2019   Educated about COVID-19 virus infection 11/25/2019   Chest pain 07/17/2018   Cough 07/17/2018   Essential hypertension 07/17/2018   Malignant neoplasm of upper-outer quadrant of right breast in female, estrogen receptor positive (HCC) 05/23/2017    PCP: Gweneth Dimitri MD  REFERRING PROVIDER: Hurman Horn, MD  REFERRING DIAG: M54.50 (ICD-10-CM) - Low back pain, unspecified; bilateral hip pain  Rationale for Evaluation and Treatment: Rehabilitation  THERAPY DIAG:  Other low back pain  Muscle weakness (generalized)  Other abnormalities of gait and mobility  Other symptoms and signs involving the musculoskeletal system  ONSET DATE: about 6 weeks  SUBJECTIVE:  SUBJECTIVE STATEMENT: Patient states bent over for shoes this morning and hurt her hamstring. Patient thinks that if she is consistent with the exercises she is in pretty good shape. Symptoms come and go. Notices it more when sedentary, bending over. Walking helps it. Patient states 85% improvement in symptoms.    From initial evaluation:  Patient states she does swimming and MD wanted her to build up bones up last year with PT. Now back has been painful. Pain began at least 6 weeks ago with insidious onset. Notes pain with walking, transfers, sitting. Patient stating pain in low back, bilateral hips, and hamstrings. Symptoms improve with more walking. Sitting for a long time 30-50 minutes and then when she gets up it hurts and so does after getting up in the morning.   PERTINENT HISTORY:  Hx cancer, osteopenia, HTN, 1974 back surgery   PAIN:  Are you having pain? yes Location: low back to prox post thighs NPRS: 1.5/10    PRECAUTIONS: None  WEIGHT BEARING  RESTRICTIONS: No  FALLS:  Has patient fallen in last 6 months? No   PLOF: Independent  PATIENT GOALS: eliminate the pain   OBJECTIVE: (objective measures from initial evaluation unless otherwise dated)   PATIENT SURVEYS:  FOTO 65% function 09/18/23:  60%  09/27/23: 69 % function  SCREENING FOR RED FLAGS: Bowel or bladder incontinence: No Spinal tumors: No Cauda equina syndrome: No Compression fracture: No Abdominal aneurysm: No  COGNITION: Overall cognitive status: Within functional limits for tasks assessed     SENSATION: WFL    PALPATION: No tenderness in lumbar spine. Tender at L greater Trochanter and R lateral hamstrings  LUMBAR ROM:   AROM eval 09/18/23 09/27/23  Flexion 25% limited (posterior LE "tight") Finger tips to ankles  0 % limited  Extension 25% limited  10% limited 10 % limited  Right lateral flexion 25% limited  WNL 0 % limited  Left lateral flexion 25% limited  10% limited 10 % limited  Right rotation     Left rotation      (Blank rows = not tested) * = pain/symptoms  LOWER EXTREMITY ROM:   decreased hip extension bilaterally  Active  Right eval Left eval  Hip flexion    Hip extension    Hip abduction    Hip adduction    Hip internal rotation    Hip external rotation    Knee flexion    Knee extension    Ankle dorsiflexion    Ankle plantarflexion    Ankle inversion    Ankle eversion     (Blank rows = not tested) * = pain/symptoms  LOWER EXTREMITY MMT:    MMT Right eval Left eval Right 09/27/23 Left 09/27/23  Hip flexion 4+ 4+ 4+ 4+  Hip extension 3+ (ROM only to neutral) 3+ (ROM only to neutral) 3+ (ROM only to neutral) 3+ (ROM only to neutral)  Hip abduction 4- 4- 4 4+  Hip adduction      Hip internal rotation      Hip external rotation      Knee flexion 5 5 5 5   Knee extension 5 5 5 5   Ankle dorsiflexion 5 5 5 5   Ankle plantarflexion      Ankle inversion      Ankle eversion       (Blank rows = not tested) * =  pain/symptoms    FUNCTIONAL TESTS:  5 times sit to stand: 12.32 seconds without UE, support, dynamic knee valgus bilaterally, decreased  eccentric control.   08/25/23: 5x STS - 11.07s  GAIT: Distance walked: 100 feet Assistive device utilized: None Level of assistance: Complete Independence Comments: WFL  TODAY'S TREATMENT:                                                                                                                              DATE:  09/27/23 Supine active hamstring stretch 5 x 10 second holds Reassessment Review of HEP  09/25/23 Pt seen for aquatic therapy today.  Treatment took place in water 3.5-4.75 ft in depth at the [facility] pool. Temp of water was 91.  Pt entered/exited the pool via stairs independently with bilat rail  - unsupported walking forward/ backward with reciprocal arm swing, multiple laps -Side stepping with arm addct/ abdct with yellow hand floats - farmer carry with single yellow -> rainbow hand float, walking forward/ backward - UE on hand floats:  hip flex/ext x 15; hip abdct/ addct x 15 cues for vertical trunk - warrior 1 - noodle lifts off of water 2 x 3 each side, cues to slow down - holding wall:  single leg clams x 10 - staggered stance with kick board row x 10 each; repeated with vectors (11 and 1o'clock) x 15 each  - TrA set with short -> long hollow noodle pull down to thighs 2 x 10  - squats pulling single yellow hand float under water 2x 10 - cycling on yellow noodle   09/20/23 Leg extension machine 10# 2 x 10 Hamstring curl machine 10# 3 x 10  Palof press RTB 2 x 10  Lift/chop RTB 2 x 10 each   09/18/23 Pt seen for aquatic therapy today.  Treatment took place in water 3.5-4.75 ft in depth at the [facility] pool. Temp of water was 91.  Pt entered/exited the pool via stairs independently with bilat rail  - unsupported walking forward/ backward with reciprocal arm swing, multiple laps -Side stepping  with arm addct/ abdct with yellow hand floats - UE on wall: hip ext x 10; hip abdct/ addct x 10 cues for vertical trunk - squats pulling single yellow hand float under water x 10, holding 3-4 sec under water - staggered stance with kick board row x 10 each; repeated with vectors (11 and 1o'clock) x 15 each  - TrA set with short -> long hollow noodle pull down to thighs 2 x 10  - warrior 3 - with yellow hand floats x 10 each - cues for technique and to slow down - warrior 1 - noodle lifts off of water x 3 each side ('feels it in back")  - cycling on yellow noodle    09/13/23 DKTC with heels on swiss ball 10 x 5 second holds Supine dead bug isometric 10 x 5 second holds Ab set 5 x 5 second holds March with ab set 2 x 10 Standing hip abduction RTB at knees 2 x 10 Standing hip extension RTB  at knees 2 x 10 Standing Row RTB 2 x 15 Standing shoulder extension RTB 2 x 15 Seated trunk flexion stretch with ball 10 x 5-10 second holds SLS 2 x 20 second holds  09/11/23 Pt seen for aquatic therapy today.  Treatment took place in water 3.5-4.75 ft in depth at the [facility] pool. Temp of water was 91.  Pt entered/exited the pool via stairs independently with bilat rail. - unsupported walking forward/ backward  -Side stepping with arm addct/ abdct with rainbow hand floats - UE on wall: hip ext x 10; hip abdct/ addct x 10 - staggered stance with kick board row x 10 each; repeated with vectors (11 and 1o'clock) x 10 each  - SLS with opp arm motions with hollow noodle under water  - balance on hollow noodle (like skate board) with intermittent UE to steady -> marching (challenge!) - squats pulling single yellow hand float under water x 15 - TrA set with hollow noodle pull down to thighs x 20 (cues for technique and to slow down)  - 3 way LE stretch with hollow blue noodle (ITB, hamstrings, adductor stretches) at ankle x 15s each position - warrior 3 - with yellow hand floats x 8 each - cues  for technique and to slow down   09/04/23 Pt seen for aquatic therapy today.  Treatment took place in water 3.5-4.75 ft in depth at the [facility] pool. Temp of water was 91.  Pt entered/exited the pool via stairs independently with bilat rail. - unsupported walking forward/ backward with cues for vertical trunk -Side stepping with arm addct/ abdct with rainbow hand floats - plank with yellow hand floats ->trial with leg lifts (challenge) and superman to/from plank (difficult) - TrA set with hollow noodle pull down to thighs, x 10 wide stance, x 5 staggered stance, x 5 in wide stance with 4 sec release to surface  - UE on wall:  single leg clams x 10 - - 3 way LE stretch with hollow blue noodle (ITB, hamstrings, adductor stretches) at ankle x 15s each position - squats pulling single yellow hand float under water x 10 (cues to allow heels to come up) - farmer carry with single yellow hand floats, walking forward/ backward  - straddling noodle and UE on corner: cycling  08/29/23 Pt seen for aquatic therapy today.  Treatment took place in water 3.5-4.75 ft in depth at the [facility] pool. Temp of water was 91.  Pt entered/exited the pool via stairs independently with bilat rail. - unsupported walking forward/ backward with cues for vertical trunk - wide stance - reciprocal arm swing with light resistance bells, horiz abdct/addct (under water) with bells -> walking forward/ backward with recipocal arm swing with green bells -Side stepping with arm addct/ abdct with rainbow hand floats - farmer carry with bilat rainbow hand floats, walking forward/ backward -> single rainbow - UE on rainbow hand floats- single hand against wall:  hip abdct/ addct, crossing midline x 15 each;  leg swings into hip flexion/ext x 15 each engage core  - UE on rainbow hand floats:  tandem gait forward/ backward x 2 laps, cues to slow speed - squats pulling single yellow hand float under water 2 x  10 - TrA set with solid -> hollow noodle pull down to thighs, 4 sec release to surface x 15  - straddling noodle and UE on corner: cycling, hip abdct/ addct and cross country ski - 3 way LE stretch with hollow  blue noodle (ITB, hamstrings, adductor stretches) at ankle x 15s each position   [facility] Adult PT Treatment:                                                DATE: 08/25/23  Therapeutic Exercise: NuStep L4: UE/LE x 5 min for warm up UE on counter:  hip ext x 10 each;  Hip abdct x 10 each;  heel toe raises x 10  Seated hamstring stretch with DF x 15s x 2 each LE  5x STS ( see above)  Seated pirirformis stretch 2 versions x 15s each Walking 300 ft  Self Care: Reviewed body mechanics for changing cat's water bowl (bend knees/hips and not just from back); pt returned demo  Educated on self massage with stick roller to buttocks and LEs; pt returned demo  DATE:  08/21/23 Pt seen for aquatic therapy today.  Treatment took place in water 3.5-4.75 ft in depth at the [facility] pool. Temp of water was 91.  Pt entered/exited the pool via stairs independently with bilat rail. - unsupported walking forward/ backward with cues for vertical trunk -Side stepping with arm addct/ abdct with rainbow hand floats - farmer carry with bilat rainbow hand floats, walking forward/ backward -> single yellow  - marching with reciprocal arm motion with rainbow hand floats - UE on yellow hand floats hip abdct/ addct x 10 each;  leg swings into hip flexion/ext x 10 each - cues to slow speed and engage core x 10 each LE - UE on wall: hip abdct/ addct x 10 each;  leg swings into hip flexion/ext x 10 each; single leg clams x 10 each  - TrA set with single rainbow hand floats pull downs to thighs x 10 - squats pulling single yellow hand float under water x 10 - straddling noodle and UE on corner: cycling, hip abdct/ addct and cross country ski - 3 way LE stretch with hollow blue noodle (ITB, hamstrings,  adductor stretches) at ankle x 15s each position   08/18/23 Pt seen for aquatic therapy today.  Treatment took place in water 3.5-4.75 ft in depth at the [facility] pool. Temp of water was 91.  Pt entered/exited the pool via stairs independently with bilat rail. - unsupported walking forward/ backward with cues for vertical trunk - farmer carry with bilat rainbow hand floats, walking forward/ backward -> single yellow  - marching with reciprocal arm motion with rainbow hand floats - UE on rainbow hand floats 3 way LE kicks - cues to slow speed and engage core x 10 each LE - UE on yellow hand floats, 3 way toe taps x 5 each LE; heel raises x 12 - TrA set with short hollow noodle pull downs to thighs x 8 -> single rainbow hand floats x 10 - UE on wall: hip abdct/ addct x 10 each;  leg swings into hip flexion/ext x 10 each - straddling noodle and UE on corner: cycling, hip abdct/ addct and cross country ski   08/16/23 NuStep L4: UE/LE x 5 min for warm up Seated hamstring stretch x15s x 2 each LE Seated piriformis stretch x 15s x 2 each LE Bridge - painful - stopped after 2 Standing resisted shoulder ext with core engaged, red band x 15;  resisted bilat row with red band x 15 STS with cues for  forward arm reach, hip hinge, core engaged 2 x 5 Trunk ext (limited tolerance past neutral)    PATIENT EDUCATION:  Education details: exercise rationale, modifications, progressions Person educated: Patient Education method: Programmer, multimedia, Demonstration,  Education comprehension: verbalized understanding, returned demonstration, verbal cues required, and tactile cues required  HOME EXERCISE PROGRAM: Land Access Code: [email or web address] URL: [email or web address]    AQUATIC Access Code: [email or web address] URL: [email or web address] This aquatic home exercise program from Med[employer] utilizes pictures from land based exercises, but has been adapted prior to lamination and issuance.    ASSESSMENT:  CLINICAL IMPRESSION: Patient has met 2/2 short term goals and 3/5 long term goals with ability to complete HEP and improvement in symptoms, activity tolerance, and functional mobility. Remaining goals not met due to continued deficits in symptoms strength and ROM although she has made good progress toward remaining goals. Reviewed HEP with patient and educated on returning if needed. Patient d/c from PT.    From initial evaluation:  Patient a 88 y.o. y.o. female who was seen today for physical therapy evaluation and treatment for low back pain. Patient presents with pain limited deficits in lumbar spine strength, ROM, endurance, activity tolerance, and functional mobility with ADL. Patient is having to modify and restrict ADL as indicated by outcome measure score as well as subjective information and objective measures which is affecting overall participation. Patient will benefit from skilled physical therapy in order to improve function and reduce impairment.  OBJECTIVE IMPAIRMENTS: decreased activity tolerance, decreased balance, decreased mobility, difficulty walking, decreased ROM, decreased strength, improper body mechanics, and pain.   ACTIVITY LIMITATIONS: carrying, lifting, bending, sitting, standing, squatting, locomotion level, and caring for others  PARTICIPATION LIMITATIONS: meal prep, cleaning, laundry, shopping, and community activity  PERSONAL FACTORS: Age, Time since onset of injury/illness/exacerbation, and 3+ comorbidities: chronic back pain, Hx cancer, osteopenia, HTN  are also affecting patient's functional outcome.   REHAB POTENTIAL: Good  CLINICAL DECISION MAKING: Stable/uncomplicated  EVALUATION COMPLEXITY: Low   GOALS: Goals reviewed with patient? Yes  SHORT TERM GOALS: Target date: 08/29/2023    Patient will be independent with HEP in order to improve functional outcomes. Baseline:  Goal status: MET  2.  Patient will report at least 25%  improvement in symptoms for improved quality of life. Baseline:  Goal status:MET - 08/25/23    LONG TERM GOALS: Target date: 09/26/2023    Patient will report at least 75% improvement in symptoms for improved quality of life. Baseline: 60% - 09/18/23 09/27/23: 85%  Goal status: MET  2.  Patient will improve FOTO score to predicted outcomes in order to indicate improved tolerance to activity. Baseline: 65% function, see above 69% function Goal status:MET  3.  Patient will demonstrate at least 25% improvement in lumbar ROM in all restricted planes for improved ability to move trunk while completing chores. Baseline:  Goal status: In progress - 09/18/23 ; not met  4.  Patient will be able to complete 5x STS in under 11.4 seconds without compensation in order to reduce the risk of falls. Baseline: 12.32 seconds without UE, support, dynamic knee valgus bilaterally, decreased eccentric control. Goal status: MET - 08/25/23  5.  Patient will demonstrate grade of 4+/5 MMT grade in all tested musculature as evidence of improved strength to assist with stair ambulation and gait.   Baseline: see above Goal status: NOT MET     PLAN:  PT FREQUENCY: 1-2x/week  PT DURATION: 8 weeks  PLANNED INTERVENTIONS: 97164- PT Re-evaluation, 97110-Therapeutic exercises, 97530- Therapeutic activity, 97112-  Neuromuscular re-education, [phone]- Self Care, 21308- Manual therapy, [phone]- Gait training, [phone]- Orthotic Fit/training, [phone]- Canalith repositioning, [id number]- Aquatic Therapy, [phone]- Splinting, Patient/Family education, Balance training, Stair training, Taping, Dry Needling, Joint mobilization, Joint manipulation, Spinal manipulation, Spinal mobilization, Scar mobilization, and DME instructions.  PLAN FOR NEXT SESSION: n/a   Wyman Songster, PT 09/27/2023, 9:36 AM  [facility] [address] [place], [place], [address] Phone: [phone]   Fax:   [phone]

## 2023-10-02 ENCOUNTER — Encounter (HOSPITAL_BASED_OUTPATIENT_CLINIC_OR_DEPARTMENT_OTHER): Payer: Medicare Other | Admitting: Physical Therapy

## 2023-10-04 ENCOUNTER — Encounter (HOSPITAL_BASED_OUTPATIENT_CLINIC_OR_DEPARTMENT_OTHER): Payer: Medicare Other | Admitting: Physical Therapy

## 2023-10-18 DIAGNOSIS — Z853 Personal history of malignant neoplasm of breast: Secondary | ICD-10-CM | POA: Diagnosis not present

## 2023-10-18 DIAGNOSIS — M810 Age-related osteoporosis without current pathological fracture: Secondary | ICD-10-CM | POA: Diagnosis not present

## 2023-10-18 DIAGNOSIS — M069 Rheumatoid arthritis, unspecified: Secondary | ICD-10-CM | POA: Diagnosis not present

## 2023-10-18 DIAGNOSIS — E041 Nontoxic single thyroid nodule: Secondary | ICD-10-CM | POA: Diagnosis not present

## 2023-10-18 DIAGNOSIS — Z8639 Personal history of other endocrine, nutritional and metabolic disease: Secondary | ICD-10-CM | POA: Diagnosis not present

## 2023-10-19 DIAGNOSIS — G5601 Carpal tunnel syndrome, right upper limb: Secondary | ICD-10-CM | POA: Diagnosis not present

## 2023-10-20 DIAGNOSIS — E785 Hyperlipidemia, unspecified: Secondary | ICD-10-CM | POA: Diagnosis not present

## 2023-10-20 DIAGNOSIS — M069 Rheumatoid arthritis, unspecified: Secondary | ICD-10-CM | POA: Diagnosis not present

## 2023-10-20 DIAGNOSIS — I10 Essential (primary) hypertension: Secondary | ICD-10-CM | POA: Diagnosis not present

## 2023-10-20 DIAGNOSIS — E041 Nontoxic single thyroid nodule: Secondary | ICD-10-CM | POA: Diagnosis not present

## 2023-10-20 DIAGNOSIS — Z6824 Body mass index (BMI) 24.0-24.9, adult: Secondary | ICD-10-CM | POA: Diagnosis not present

## 2023-10-20 DIAGNOSIS — D0511 Intraductal carcinoma in situ of right breast: Secondary | ICD-10-CM | POA: Diagnosis not present

## 2023-10-20 DIAGNOSIS — M810 Age-related osteoporosis without current pathological fracture: Secondary | ICD-10-CM | POA: Diagnosis not present

## 2023-10-29 ENCOUNTER — Other Ambulatory Visit: Payer: Self-pay | Admitting: Cardiology

## 2023-10-31 DIAGNOSIS — E041 Nontoxic single thyroid nodule: Secondary | ICD-10-CM | POA: Diagnosis not present

## 2023-11-05 DIAGNOSIS — I350 Nonrheumatic aortic (valve) stenosis: Secondary | ICD-10-CM | POA: Insufficient documentation

## 2023-11-05 NOTE — Progress Notes (Unsigned)
  Cardiology Office Note:   Date:  11/06/2023  ID:  Christina Henderson, DOB April 09, 1936, MRN [id number] PCP: Helyn Lobstein, MD  [facility] Providers Cardiologist:  Eilleen Grates, MD {  History of Present Illness:   Christina Henderson is a 88 y.o. female who presents for follow up of chest pain. She had a negative POET (Plain Old Exercise Treadmill) in 2019.  She had shortness of breath when I saw her .  BNP was normal.  She had a murmur with mild AS on echo in Nov 2024.    Since I last saw her she has had fatigue because she does not sleep.  However, she has had no new cardiac complaints.  She palpitations and has had no syncope.  Denies palpitations, she does get dizziness however.  She says this happens a couple hours after she takes her meds.  She has had her hydralazine  reduced by her primary provider.  She brings me her blood pressures and her pressures have been lower.  They have been trending down to systolics in the 90s.  She is not swimming as much as she was because she is fatigued.  She does do 30 minutes of PT and some water aerobics.  ROS: Positive for insomnia.  Otherwise as stated in the HPI and negative for all other systems.  Studies Reviewed:    EKG:   EKG Interpretation Date/Time:  Monday November 06 2023 11:17:14 EDT Ventricular Rate:  48 PR Interval:  152 QRS Duration:  96 QT Interval:  474 QTC Calculation: 423 R Axis:   -20  Text Interpretation: Sinus bradycardia with Premature atrial complexes Poor anterior R wave progression When compared with ECG of 10-May-2023 15:26, Premature atrial complexes are now Present Non-specific ST-t changes Confirmed by Eilleen Grates ([id number]) on 11/06/2023 11:30:39 AM    Risk Assessment/Calculations:     Physical Exam:   VS:  BP (!) 146/60 (BP Location: Left Arm, Patient Position: Sitting)   Pulse (!) 48   Ht 5\' 1"  (1.549 m)   Wt 130 lb (59 kg)   SpO2 99%   BMI 24.56 kg/m    Wt Readings from Last 3 Encounters:   11/06/23 130 lb (59 kg)  05/29/23 132 lb 11.2 oz (60.2 kg)  05/10/23 135 lb 12.8 oz (61.6 kg)     GEN: Well nourished, well developed in no acute distress NECK: No JVD; No carotid bruits CARDIAC: RRR, 2 out of 6 apical systolic murmur early peaking and radiating slightly at aortic outflow tract, no diastolic murmurs, rubs, gallops RESPIRATORY:  Clear to auscultation without rales, wheezing or rhonchi  ABDOMEN: Soft, non-tender, non-distended EXTREMITIES:  No edema; No deformity   ASSESSMENT AND PLAN:   HTN:   Her blood pressure is running lower.  I am going to reduce her hydralazine  to 20 mg in the morning, 30 mg in the afternoon and 20 mg in the evening.  She can always take an extra 10 mg as needed.  We had a long discussion about this.    AS:   This was mild on echo in November 2024.  I will follow this clinically.  No further imaging at this time.     Follow up with me in six months.   Signed, Eilleen Grates, MD

## 2023-11-06 ENCOUNTER — Encounter: Payer: Self-pay | Admitting: Cardiology

## 2023-11-06 ENCOUNTER — Ambulatory Visit: Payer: Medicare Other | Attending: Cardiology | Admitting: Cardiology

## 2023-11-06 VITALS — BP 146/60 | HR 48 | Ht 61.0 in | Wt 130.0 lb

## 2023-11-06 DIAGNOSIS — R0602 Shortness of breath: Secondary | ICD-10-CM

## 2023-11-06 DIAGNOSIS — I10 Essential (primary) hypertension: Secondary | ICD-10-CM

## 2023-11-06 DIAGNOSIS — I350 Nonrheumatic aortic (valve) stenosis: Secondary | ICD-10-CM | POA: Diagnosis not present

## 2023-11-06 MED ORDER — HYDRALAZINE HCL 10 MG PO TABS: ORAL_TABLET | ORAL | 5 refills | Status: DC

## 2023-11-06 MED ORDER — SPIRONOLACTONE 25 MG PO TABS: 25.0000 mg | ORAL_TABLET | Freq: Every day | ORAL | 3 refills | Status: DC

## 2023-11-06 NOTE — Patient Instructions (Signed)
 Medication Instructions:  Your physician has recommended you make the following change in your medication:  CHANGE: Hydralazine  20 mg in the morning, 30 mg at lunch, 20 mg at dinner.  *If you need a refill on your cardiac medications before your next appointment, please call your pharmacy*   Follow-Up: At [facility], you and your health needs are our priority.  As part of our continuing mission to provide you with exceptional heart care, our providers are all part of one team.  This team includes your primary Cardiologist (physician) and Advanced Practice Providers or APPs (Physician Assistants and Nurse Practitioners) who all work together to provide you with the care you need, when you need it.  Your next appointment:   6 month(s)  Provider:   Eilleen Grates, MD

## 2023-11-22 DIAGNOSIS — D692 Other nonthrombocytopenic purpura: Secondary | ICD-10-CM | POA: Diagnosis not present

## 2023-11-22 DIAGNOSIS — L821 Other seborrheic keratosis: Secondary | ICD-10-CM | POA: Diagnosis not present

## 2023-11-22 DIAGNOSIS — L400 Psoriasis vulgaris: Secondary | ICD-10-CM | POA: Diagnosis not present

## 2023-12-05 DIAGNOSIS — H401132 Primary open-angle glaucoma, bilateral, moderate stage: Secondary | ICD-10-CM | POA: Diagnosis not present

## 2023-12-05 DIAGNOSIS — H43813 Vitreous degeneration, bilateral: Secondary | ICD-10-CM | POA: Diagnosis not present

## 2023-12-21 DIAGNOSIS — L821 Other seborrheic keratosis: Secondary | ICD-10-CM | POA: Diagnosis not present

## 2024-01-25 DIAGNOSIS — M65342 Trigger finger, left ring finger: Secondary | ICD-10-CM | POA: Diagnosis not present

## 2024-01-25 DIAGNOSIS — G5601 Carpal tunnel syndrome, right upper limb: Secondary | ICD-10-CM | POA: Diagnosis not present

## 2024-01-29 DIAGNOSIS — E785 Hyperlipidemia, unspecified: Secondary | ICD-10-CM | POA: Diagnosis not present

## 2024-02-01 DIAGNOSIS — M1991 Primary osteoarthritis, unspecified site: Secondary | ICD-10-CM | POA: Diagnosis not present

## 2024-02-01 DIAGNOSIS — G5601 Carpal tunnel syndrome, right upper limb: Secondary | ICD-10-CM | POA: Diagnosis not present

## 2024-02-01 DIAGNOSIS — L409 Psoriasis, unspecified: Secondary | ICD-10-CM | POA: Diagnosis not present

## 2024-02-01 DIAGNOSIS — Z6823 Body mass index (BMI) 23.0-23.9, adult: Secondary | ICD-10-CM | POA: Diagnosis not present

## 2024-02-01 DIAGNOSIS — Z79899 Other long term (current) drug therapy: Secondary | ICD-10-CM | POA: Diagnosis not present

## 2024-02-01 DIAGNOSIS — M0579 Rheumatoid arthritis with rheumatoid factor of multiple sites without organ or systems involvement: Secondary | ICD-10-CM | POA: Diagnosis not present

## 2024-02-14 DIAGNOSIS — Z6824 Body mass index (BMI) 24.0-24.9, adult: Secondary | ICD-10-CM | POA: Diagnosis not present

## 2024-02-14 DIAGNOSIS — R197 Diarrhea, unspecified: Secondary | ICD-10-CM | POA: Diagnosis not present

## 2024-03-15 DIAGNOSIS — M79641 Pain in right hand: Secondary | ICD-10-CM | POA: Diagnosis not present

## 2024-04-02 ENCOUNTER — Other Ambulatory Visit: Payer: Self-pay | Admitting: Hematology and Oncology

## 2024-04-02 DIAGNOSIS — Z1231 Encounter for screening mammogram for malignant neoplasm of breast: Secondary | ICD-10-CM

## 2024-04-09 DIAGNOSIS — Z23 Encounter for immunization: Secondary | ICD-10-CM | POA: Diagnosis not present

## 2024-04-16 DIAGNOSIS — Z1331 Encounter for screening for depression: Secondary | ICD-10-CM | POA: Diagnosis not present

## 2024-04-16 DIAGNOSIS — Z0000 Encounter for general adult medical examination without abnormal findings: Secondary | ICD-10-CM | POA: Diagnosis not present

## 2024-04-18 DIAGNOSIS — G5601 Carpal tunnel syndrome, right upper limb: Secondary | ICD-10-CM | POA: Diagnosis not present

## 2024-04-29 ENCOUNTER — Other Ambulatory Visit: Payer: Self-pay | Admitting: Cardiology

## 2024-05-01 DIAGNOSIS — R001 Bradycardia, unspecified: Secondary | ICD-10-CM | POA: Diagnosis not present

## 2024-05-01 DIAGNOSIS — M810 Age-related osteoporosis without current pathological fracture: Secondary | ICD-10-CM | POA: Diagnosis not present

## 2024-05-01 DIAGNOSIS — I10 Essential (primary) hypertension: Secondary | ICD-10-CM | POA: Diagnosis not present

## 2024-05-01 DIAGNOSIS — R634 Abnormal weight loss: Secondary | ICD-10-CM | POA: Diagnosis not present

## 2024-05-01 DIAGNOSIS — Z6822 Body mass index (BMI) 22.0-22.9, adult: Secondary | ICD-10-CM | POA: Diagnosis not present

## 2024-05-01 DIAGNOSIS — M069 Rheumatoid arthritis, unspecified: Secondary | ICD-10-CM | POA: Diagnosis not present

## 2024-05-01 DIAGNOSIS — D0511 Intraductal carcinoma in situ of right breast: Secondary | ICD-10-CM | POA: Diagnosis not present

## 2024-05-01 DIAGNOSIS — E785 Hyperlipidemia, unspecified: Secondary | ICD-10-CM | POA: Diagnosis not present

## 2024-05-01 DIAGNOSIS — E041 Nontoxic single thyroid nodule: Secondary | ICD-10-CM | POA: Diagnosis not present

## 2024-05-05 ENCOUNTER — Encounter: Payer: Self-pay | Admitting: Cardiology

## 2024-05-05 NOTE — Progress Notes (Unsigned)
  Cardiology Office Note:   Date:  05/06/2024  ID:  Christina Henderson, DOB [birth year], MRN [id number] PCP: Aisha Harvey, MD  [facility] Providers Cardiologist:  Lynwood Schilling, MD {  History of Present Illness:   Christina Henderson is a 88 y.o. female who presents for follow up of chest pain. She had a negative POET (Plain Old Exercise Treadmill) in 2019.  She had shortness of breath when I saw her .  BNP was normal.  She had a murmur with mild AS on echo in Nov 2024.    Since I last saw her her blood pressure had been running low.  Dr. Lenora recently reduced her amlodipine  to 5 mg and her hydralazine  to 10, 20, 10.  She brings a blood pressure diary and her blood pressures which were fairly consistently below 100 now seem to be in the 110 range systolic.  Diastolics are still low.  The patient denies any new symptoms such as chest discomfort, neck or arm discomfort. There has been no new shortness of breath, PND or orthopnea. There have been no reported palpitations, presyncope or syncope.   ROS: As stated in the HPI and negative for all other systems.  Studies Reviewed:    EKG:     NA  Risk Assessment/Calculations:       Physical Exam:   VS:  BP (!) 134/58 (BP Location: Left Arm, Patient Position: Sitting, Cuff Size: Normal)   Pulse (!) 58   Ht 5' 1 (1.549 m)   Wt 122 lb 4.8 oz (55.5 kg)   SpO2 97%   BMI 23.11 kg/m    Wt Readings from Last 3 Encounters:  05/06/24 122 lb 4.8 oz (55.5 kg)  11/06/23 130 lb (59 kg)  05/29/23 132 lb 11.2 oz (60.2 kg)     GEN: Well nourished, well developed in no acute distress NECK: No JVD; No carotid bruits CARDIAC: RRR, 2 out of 6 systolic murmur radiating slightly at the aortic outflow tract, no diastolic murmurs, rubs, gallops RESPIRATORY:  Clear to auscultation without rales, wheezing or rhonchi  ABDOMEN: Soft, non-tender, non-distended EXTREMITIES:  No edema; No deformity   ASSESSMENT AND PLAN:   HTN:    Her blood  pressure seems to be at target on the lower doses as above.  She will continue to keep a diary and we can dose adjust as needed.   AS:   I will follow this clinically.  No change in therapy.   Follow up with me in April.  Signed, Lynwood Schilling, MD

## 2024-05-06 ENCOUNTER — Ambulatory Visit: Attending: Cardiology | Admitting: Cardiology

## 2024-05-06 ENCOUNTER — Encounter: Payer: Self-pay | Admitting: Cardiology

## 2024-05-06 VITALS — BP 134/58 | HR 58 | Ht 61.0 in | Wt 122.3 lb

## 2024-05-06 DIAGNOSIS — I10 Essential (primary) hypertension: Secondary | ICD-10-CM | POA: Insufficient documentation

## 2024-05-06 DIAGNOSIS — I350 Nonrheumatic aortic (valve) stenosis: Secondary | ICD-10-CM | POA: Diagnosis not present

## 2024-05-06 MED ORDER — HYDRALAZINE HCL 10 MG PO TABS: ORAL_TABLET | ORAL | Status: AC

## 2024-05-06 MED ORDER — AMLODIPINE BESYLATE 5 MG PO TABS: 5.0000 mg | ORAL_TABLET | Freq: Every day | ORAL | Status: DC

## 2024-05-06 NOTE — Patient Instructions (Signed)
 Medication Instructions:  Your physician recommends that you continue on your current medications as directed. Please refer to the Current Medication list given to you today.  *If you need a refill on your cardiac medications before your next appointment, please call your pharmacy*  Lab Work: NONE If you have labs (blood work) drawn today and your tests are completely normal, you will receive your results only by: MyChart Message (if you have MyChart) OR A paper copy in the mail If you have any lab test that is abnormal or we need to change your treatment, we will call you to review the results.  Testing/Procedures: NONE  Follow-Up: At [facility], you and your health needs are our priority.  As part of our continuing mission to provide you with exceptional heart care, our providers are all part of one team.  This team includes your primary Cardiologist (physician) and Advanced Practice Providers or APPs (Physician Assistants and Nurse Practitioners) who all work together to provide you with the care you need, when you need it.  Your next appointment:   6 months  Provider:   Lavona, MD  We recommend signing up for the patient portal called MyChart.  Sign up information is provided on this After Visit Summary.  MyChart is used to connect with patients for Virtual Visits (Telemedicine).  Patients are able to view lab/test results, encounter notes, upcoming appointments, etc.  Non-urgent messages can be sent to your provider as well.   To learn more about what you can do with MyChart, go to [email or web address].

## 2024-05-07 ENCOUNTER — Ambulatory Visit
Admission: RE | Admit: 2024-05-07 | Discharge: 2024-05-07 | Disposition: A | Discharge status: Home / Self Care | Source: Ambulatory Visit | Attending: Hematology and Oncology | Admitting: Hematology and Oncology

## 2024-05-07 DIAGNOSIS — Z1231 Encounter for screening mammogram for malignant neoplasm of breast: Secondary | ICD-10-CM

## 2024-05-28 DIAGNOSIS — M0570 Rheumatoid arthritis with rheumatoid factor of unspecified site without organ or systems involvement: Secondary | ICD-10-CM | POA: Diagnosis not present

## 2024-05-28 DIAGNOSIS — H401132 Primary open-angle glaucoma, bilateral, moderate stage: Secondary | ICD-10-CM | POA: Diagnosis not present

## 2024-05-28 DIAGNOSIS — H35033 Hypertensive retinopathy, bilateral: Secondary | ICD-10-CM | POA: Diagnosis not present

## 2024-05-29 ENCOUNTER — Inpatient Hospital Stay: Payer: Medicare Other | Attending: Hematology and Oncology | Admitting: Hematology and Oncology

## 2024-05-29 VITALS — BP 120/66 | HR 46 | Temp 97.7°F | Resp 16 | Ht 61.0 in | Wt 122.7 lb

## 2024-05-29 DIAGNOSIS — C50411 Malignant neoplasm of upper-outer quadrant of right female breast: Secondary | ICD-10-CM | POA: Diagnosis not present

## 2024-05-29 DIAGNOSIS — Z853 Personal history of malignant neoplasm of breast: Secondary | ICD-10-CM | POA: Insufficient documentation

## 2024-05-29 DIAGNOSIS — Z170 Estrogen receptor positive status [ER+]: Secondary | ICD-10-CM | POA: Diagnosis not present

## 2024-05-29 DIAGNOSIS — Z08 Encounter for follow-up examination after completed treatment for malignant neoplasm: Secondary | ICD-10-CM | POA: Insufficient documentation

## 2024-05-29 DIAGNOSIS — M810 Age-related osteoporosis without current pathological fracture: Secondary | ICD-10-CM | POA: Insufficient documentation

## 2024-05-29 NOTE — Progress Notes (Signed)
 Patient Care Team: Aisha Harvey, MD as PCP - General (Family Medicine) Lavona Agent, MD as PCP - Cardiology (Cardiology) Odean Potts, MD as Consulting Physician (Hematology and Oncology) Crawford, Morna Pickle, NP as Nurse Practitioner (Hematology and Oncology) Vanderbilt Ned, MD as Consulting Physician (General Surgery) Izell Domino, MD as Attending Physician (Radiation Oncology) Lavona Agent, MD as Consulting Physician (Cardiology)  DIAGNOSIS:  Encounter Diagnosis  Name Primary?   Malignant neoplasm of upper-outer quadrant of right breast in female, estrogen receptor positive (HCC) Yes    SUMMARY OF ONCOLOGIC HISTORY: Oncology History  Malignant neoplasm of upper-outer quadrant of right breast in female, estrogen receptor positive (HCC)  04/12/2017 Initial Diagnosis   right breast suspicious mass at 12 o'clock position 1.1 cm; left breast 1 o'clock position 0.6 cm   05/09/2017 Surgery   Right lumpectomy: IDC grade 1, 1.1 cm, low-grade DCIS, margins negative, 0/3 lymph nodes negative ER 100%, PR 100%, HER-2 negative ratio 1.26, Ki-67 1%, T1CN0 stage I a; left lumpectomy: Intraductal papilloma, ADH   05/2017 -  Anti-estrogen oral therapy   Anastrozole  daily     CHIEF COMPLIANT: Surveillance of breast cancer on anastrozole  therapy  HISTORY OF PRESENT ILLNESS:  History of Present Illness Christina Henderson is an 88 year old female who presents for a follow-up visit after completing anastrozole  therapy.  She has completed her course of anastrozole  without experiencing adverse reactions, including hot flashes or mental cloudiness. Her recent mammogram shows favorable results with less dense breast tissue.     ALLERGIES:  is allergic to caffeine, flavoring agent, and sulfa antibiotics.  MEDICATIONS:  Current Outpatient Medications  Medication Sig Dispense Refill   anastrozole  (ARIMIDEX ) 1 MG tablet Take 1 tablet (1 mg total) by mouth daily. 90 tablet 3    candesartan (ATACAND) 32 MG tablet Take 32 mg by mouth daily.     candesartan (ATACAND) 32 MG tablet Take 16 mg by mouth 2 (two) times daily.     Cholecalciferol (VITAMIN D3) 10 MCG (400 UNIT) tablet Take 400 Units by mouth daily.     fluocinonide cream (LIDEX) 0.05 % Apply 1 application topically 2 (two) times daily.     fluocinonide cream (LIDEX) 0.05 % Apply 1 Application topically as needed.     hydrALAZINE  (APRESOLINE ) 10 MG tablet Take 1 tablet (10 mg total) by mouth in the morning AND 2 tablets (20 mg total) daily with lunch AND 1 tablet (10 mg total) every evening.     hydroxychloroquine  (PLAQUENIL ) 200 MG tablet Take 2 tablets (400 mg total) by mouth daily.     hydroxychloroquine  (PLAQUENIL ) 200 MG tablet Take 200 mg by mouth 2 (two) times daily.     Melatonin 10 MG TABS Take 10 mg by mouth at bedtime as needed.     Multiple Vitamin (MULTIVITAMIN WITH MINERALS) TABS tablet Take 1 tablet by mouth daily.     Polyvinyl Alcohol-Povidone PF 1.4-0.6 % SOLN Place 1 drop into both eyes as needed.     Probiotic Product (PROBIOTIC ACIDOPHILUS BIOBEADS) CAPS Take by mouth daily.      rosuvastatin (CRESTOR) 10 MG tablet Take 10 mg by mouth daily.     rosuvastatin (CRESTOR) 20 MG tablet Take 20 mg by mouth daily.     No current facility-administered medications for this visit.    PHYSICAL EXAMINATION: ECOG PERFORMANCE STATUS: 1 - Symptomatic but completely ambulatory  Vitals:   05/29/24 1013  BP: 120/66  Pulse: (!) 46  Resp: 16  Temp: 97.7 F (  36.5 C)  SpO2: 99%   Filed Weights   05/29/24 1013  Weight: 122 lb 11.2 oz (55.7 kg)    LABORATORY DATA:  I have reviewed the data as listed    Latest Ref Rng & Units 12/23/2022    9:23 AM 05/03/2017    3:06 PM  CMP  Glucose 70 - 99 mg/dL 88  88   BUN 8 - 27 mg/dL 19  24   Creatinine 9.42 - 1.00 mg/dL 9.17  9.25   Sodium 865 - 144 mmol/L 140  140   Potassium 3.5 - 5.2 mmol/L 4.9  4.2   Chloride 96 - 106 mmol/L 104  105   CO2 20 - 29  mmol/L 25  28   Calcium 8.7 - 10.3 mg/dL 89.8  89.9   Total Protein 6.5 - 8.1 g/dL  7.1   Total Bilirubin 0.3 - 1.2 mg/dL  1.0   Alkaline Phos 38 - 126 U/L  63   AST 15 - 41 U/L  24   ALT 14 - 54 U/L  25     Lab Results  Component Value Date   WBC 6.7 05/03/2017   HGB 14.3 05/03/2017   HCT 43.9 05/03/2017   MCV 93.8 05/03/2017   PLT 233 05/03/2017   NEUTROABS 4.1 05/03/2017    ASSESSMENT & PLAN:  Malignant neoplasm of upper-outer quadrant of right breast in female, estrogen receptor positive (HCC) 05/09/2017: Right lumpectomy: IDC grade 1, 1.1 cm, low-grade DCIS, margins negative, 0/3 lymph nodes negative ER 100%, PR 100%, HER-2 negative ratio 1.26, Ki-67 1%, T1CN0 stage I a; left lumpectomy: Intraductal papilloma, ADH   Current treatment: Anastrozole  1 mg p.o. daily started 05/23/2017 She completed 7 years of anastrozole  therapy.  Therefore we discontinued it at this time.   Anastrozole  toxicities: Denies any side effects.   Breast cancer surveillance: 1. Mammogram 05/10/2024: Benign breast density category B 2. Bone density 04/26/2022: T score -2.7: Osteoporosis: She took Boniva for 5 years and discontinued it recently.   She talked about her life growing up in [place] . Return to clinic on an as-needed basis.  No orders of the defined types were placed in this encounter.  The patient has a good understanding of the overall plan. she agrees with it. she will call with any problems that may develop before the next visit here.  I personally spent a total of 30 minutes in the care of the patient today including preparing to see the patient, getting/reviewing separately obtained history, performing a medically appropriate exam/evaluation, counseling and educating, placing orders, referring and communicating with other health care professionals, documenting clinical information in the EHR, independently interpreting results, communicating results, and coordinating care.   Viinay K  Eron Goble, MD 05/29/24

## 2024-05-29 NOTE — Assessment & Plan Note (Signed)
 05/09/2017: Right lumpectomy: IDC grade 1, 1.1 cm, low-grade DCIS, margins negative, 0/3 lymph nodes negative ER 100%, PR 100%, HER-2 negative ratio 1.26, Ki-67 1%, T1CN0 stage I a; left lumpectomy: Intraductal papilloma, ADH   Current treatment: Anastrozole  1 mg p.o. daily started 05/23/2017 1 more year of anastrozole  recommended   Anastrozole  toxicities: Denies any side effects.  She has rheumatoid arthritis which causes her some joint aches and stiffness. She also has psoriasis for which she is receiving light therapy.  She is hoping that this will work to get rid of the psoriasis.   Breast cancer surveillance: 1. Mammogram 05/10/2024: Benign breast density category B 2. Breast exam 05/29/2024: Benign 3. Bone density 04/26/2022: T score -2.7: Osteoporosis: She took Boniva for 5 years and discontinued it recently.   She talked about her life growing up in [place] . Return to clinic on an as-needed basis.

## 2024-07-26 ENCOUNTER — Other Ambulatory Visit: Payer: Self-pay | Admitting: Cardiology

## 2024-07-31 ENCOUNTER — Ambulatory Visit: Admitting: Podiatry

## 2024-07-31 ENCOUNTER — Other Ambulatory Visit: Payer: Self-pay | Admitting: Cardiology

## 2024-08-01 NOTE — Telephone Encounter (Signed)
 Patient wanted Dr. Lavona to know how her readings have been. For the last week patient's SBP has been SBP 104, 107, 138(missed midday dose), 103, 111, 115, 131(missed midday dose), and 107. Had to add amlodipine  and spirolactone back to patient's list. Oncology discontinues from patient's list stating patient preference. Patient stated she was not told to stop and she has not stopped. Patient stated her amlodipine  was decreased to 5 mg, but nothing else has changed. Will forward to Dr. Lavona, for  further advisement.

## 2024-08-11 ENCOUNTER — Encounter: Payer: Self-pay | Admitting: Cardiology

## 2024-11-04 ENCOUNTER — Ambulatory Visit: Admitting: Cardiology
